# Patient Record
Sex: Female | Born: 1950 | Race: White | Hispanic: No | Marital: Single
Health system: Southern US, Community
[De-identification: ages and names within clinical notes are randomized; demographics above are authoritative.]

## PROBLEM LIST (undated history)

## (undated) DIAGNOSIS — J9621 Acute and chronic respiratory failure with hypoxia: Secondary | ICD-10-CM

## (undated) DIAGNOSIS — U071 COVID-19: Secondary | ICD-10-CM

## (undated) DIAGNOSIS — N186 End stage renal disease: Secondary | ICD-10-CM

## (undated) DIAGNOSIS — J1282 Pneumonia due to coronavirus disease 2019: Secondary | ICD-10-CM

---

## 2019-11-05 ENCOUNTER — Inpatient Hospital Stay
Admission: RE | Admit: 2019-11-05 | Discharge: 2019-12-19 | Disposition: A | Discharge status: Skilled Nursing Facility | Payer: Medicare Other | Source: Ambulatory Visit | Attending: Internal Medicine | Admitting: Internal Medicine

## 2019-11-05 ENCOUNTER — Ambulatory Visit (HOSPITAL_COMMUNITY)
Admission: AD | Admit: 2019-11-05 | Discharge: 2019-11-05 | Disposition: A | Discharge status: Home / Self Care | Payer: Medicare Other | Source: Other Acute Inpatient Hospital | Attending: Internal Medicine | Admitting: Internal Medicine

## 2019-11-05 ENCOUNTER — Inpatient Hospital Stay
Admission: RE | Admit: 2019-11-05 | Payer: Medicare Other | Source: Other Acute Inpatient Hospital | Admitting: Internal Medicine

## 2019-11-05 ENCOUNTER — Other Ambulatory Visit (HOSPITAL_COMMUNITY): Payer: Medicare Other

## 2019-11-05 DIAGNOSIS — J9690 Respiratory failure, unspecified, unspecified whether with hypoxia or hypercapnia: Secondary | ICD-10-CM

## 2019-11-05 DIAGNOSIS — J80 Acute respiratory distress syndrome: Secondary | ICD-10-CM

## 2019-11-05 DIAGNOSIS — Z992 Dependence on renal dialysis: Secondary | ICD-10-CM

## 2019-11-05 DIAGNOSIS — N186 End stage renal disease: Secondary | ICD-10-CM | POA: Insufficient documentation

## 2019-11-05 DIAGNOSIS — Z9911 Dependence on respirator [ventilator] status: Secondary | ICD-10-CM

## 2019-11-05 DIAGNOSIS — R1110 Vomiting, unspecified: Secondary | ICD-10-CM

## 2019-11-05 DIAGNOSIS — J811 Chronic pulmonary edema: Secondary | ICD-10-CM

## 2019-11-05 DIAGNOSIS — J1282 Pneumonia due to coronavirus disease 2019: Secondary | ICD-10-CM | POA: Diagnosis present

## 2019-11-05 DIAGNOSIS — Z931 Gastrostomy status: Secondary | ICD-10-CM

## 2019-11-05 DIAGNOSIS — J189 Pneumonia, unspecified organism: Secondary | ICD-10-CM

## 2019-11-05 DIAGNOSIS — U071 COVID-19: Secondary | ICD-10-CM | POA: Diagnosis not present

## 2019-11-05 DIAGNOSIS — J9621 Acute and chronic respiratory failure with hypoxia: Secondary | ICD-10-CM | POA: Diagnosis present

## 2019-11-05 HISTORY — DX: Acute and chronic respiratory failure with hypoxia: J96.21

## 2019-11-05 HISTORY — DX: End stage renal disease: N18.6

## 2019-11-05 HISTORY — DX: Pneumonia due to coronavirus disease 2019: J12.82

## 2019-11-05 HISTORY — DX: COVID-19: U07.1

## 2019-11-05 LAB — BLOOD GAS, ARTERIAL
Acid-base deficit: 1.9 mmol/L (ref 0.0–2.0)
Acid-base deficit: 2 mmol/L (ref 0.0–2.0)
Bicarbonate: 24 mmol/L (ref 20.0–28.0)
Bicarbonate: 24 mmol/L (ref 20.0–28.0)
FIO2: 50
FIO2: 50
O2 Saturation: 95.6 %
O2 Saturation: 97.2 %
Patient temperature: 37
Patient temperature: 36.9
pCO2 arterial: 53.3 mmHg — ABNORMAL HIGH (ref 32.0–48.0)
pCO2 arterial: 54.6 mmHg — ABNORMAL HIGH (ref 32.0–48.0)
pH, Arterial: 7.275 — ABNORMAL LOW (ref 7.350–7.450)
pH, Arterial: 7.266 — ABNORMAL LOW (ref 7.350–7.450)
pO2, Arterial: 86.9 mmHg (ref 83.0–108.0)
pO2, Arterial: 104 mmHg (ref 83.0–108.0)

## 2019-11-05 LAB — COMPREHENSIVE METABOLIC PANEL
ALT: 14 U/L (ref 0–44)
AST: 19 U/L (ref 15–41)
Albumin: 2.3 g/dL — ABNORMAL LOW (ref 3.5–5.0)
Alkaline Phosphatase: 92 U/L (ref 38–126)
Anion gap: 15 (ref 5–15)
BUN: 99 mg/dL — ABNORMAL HIGH (ref 8–23)
CO2: 23 mmol/L (ref 22–32)
Calcium: 9.6 mg/dL (ref 8.9–10.3)
Chloride: 95 mmol/L — ABNORMAL LOW (ref 98–111)
Creatinine, Ser: 5.21 mg/dL — ABNORMAL HIGH (ref 0.44–1.00)
GFR, Estimated: 8 mL/min — ABNORMAL LOW (ref >60)
Glucose, Bld: 110 mg/dL — ABNORMAL HIGH (ref 70–99)
Potassium: 3.5 mmol/L (ref 3.5–5.1)
Sodium: 133 mmol/L — ABNORMAL LOW (ref 135–145)
Total Bilirubin: 1 mg/dL (ref 0.3–1.2)
Total Protein: 5.3 g/dL — ABNORMAL LOW (ref 6.5–8.1)

## 2019-11-05 LAB — CBC WITH DIFFERENTIAL/PLATELET
Abs Immature Granulocytes: 0.19 10*3/uL — ABNORMAL HIGH (ref 0.00–0.07)
Basophils Absolute: 0.1 10*3/uL (ref 0.0–0.1)
Basophils Relative: 1 %
Eosinophils Absolute: 0.7 10*3/uL — ABNORMAL HIGH (ref 0.0–0.5)
Eosinophils Relative: 7 %
HCT: 30.6 % — ABNORMAL LOW (ref 36.0–46.0)
Hemoglobin: 9.2 g/dL — ABNORMAL LOW (ref 12.0–15.0)
Immature Granulocytes: 2 %
Lymphocytes Relative: 17 %
Lymphs Abs: 1.8 10*3/uL (ref 0.7–4.0)
MCH: 26.5 pg (ref 26.0–34.0)
MCHC: 30.1 g/dL (ref 30.0–36.0)
MCV: 88.2 fL (ref 80.0–100.0)
Monocytes Absolute: 0.6 10*3/uL (ref 0.1–1.0)
Monocytes Relative: 6 %
Neutro Abs: 7.1 10*3/uL (ref 1.7–7.7)
Neutrophils Relative %: 67 %
Platelets: 269 10*3/uL (ref 150–400)
RBC: 3.47 MIL/uL — ABNORMAL LOW (ref 3.87–5.11)
RDW: 16.3 % — ABNORMAL HIGH (ref 11.5–15.5)
WBC: 10.5 10*3/uL (ref 4.0–10.5)
nRBC: 0 % (ref 0.0–0.2)

## 2019-11-05 LAB — PROTIME-INR
INR: 1.1 (ref 0.8–1.2)
Prothrombin Time: 13.9 seconds (ref 11.4–15.2)

## 2019-11-05 LAB — MAGNESIUM: Magnesium: 2.4 mg/dL (ref 1.7–2.4)

## 2019-11-05 LAB — T4, FREE: Free T4: 0.66 ng/dL (ref 0.61–1.12)

## 2019-11-05 LAB — PHOSPHORUS: Phosphorus: 4.9 mg/dL — ABNORMAL HIGH (ref 2.5–4.6)

## 2019-11-05 LAB — TSH: TSH: 5.566 u[IU]/mL — ABNORMAL HIGH (ref 0.350–4.500)

## 2019-11-05 MED ORDER — IOHEXOL 300 MG/ML  SOLN
50.0000 mL | Freq: Once | INTRAMUSCULAR | Status: AC | PRN
  Administered 2019-11-05: 20 mL

## 2019-11-06 ENCOUNTER — Encounter: Payer: Self-pay | Admitting: Internal Medicine

## 2019-11-06 ENCOUNTER — Other Ambulatory Visit (HOSPITAL_COMMUNITY): Payer: Medicare Other

## 2019-11-06 DIAGNOSIS — Z9911 Dependence on respirator [ventilator] status: Secondary | ICD-10-CM | POA: Diagnosis not present

## 2019-11-06 DIAGNOSIS — J9621 Acute and chronic respiratory failure with hypoxia: Secondary | ICD-10-CM | POA: Diagnosis not present

## 2019-11-06 DIAGNOSIS — U071 COVID-19: Secondary | ICD-10-CM | POA: Diagnosis not present

## 2019-11-06 DIAGNOSIS — N186 End stage renal disease: Secondary | ICD-10-CM

## 2019-11-06 DIAGNOSIS — J1282 Pneumonia due to coronavirus disease 2019: Secondary | ICD-10-CM

## 2019-11-06 DIAGNOSIS — Z992 Dependence on renal dialysis: Secondary | ICD-10-CM

## 2019-11-06 NOTE — Consult Note (Signed)
Pulmonary [place]  Date of Service: 11/06/2019  PULMONARY CRITICAL CARE CONSULT   Veronica Melton  VEL:[id number]  DOB: 1950-06-07   DOA: 11/05/2019  Referring Physician: Merton Border, MD  HPI: Veronica Melton is a 69 y.o. female seen for follow up of Acute on Chronic Respiratory Failure. Patient has a past medical history that is significant for hyperlipidemia type 2 diabetes chronic pain restless legs GERD obesity hypertension who was seen initially with complaints of viral illness.  She was tested positive for COVID-19 and initially managed as an outpatient however after 9 days on she presented to the hospital with increasing shortness of breath cough worsening diffuse body aches nausea vomiting.  In the emergency department was noted to have a pulse oximetry in the 80s was initially placed on 10 L of oxygen chest x-ray showed multifocal infiltrates pneumonia.  Patient had a decline in her status ended up intubated on the ventilator patient had been given remdesivir and Decadron.  In addition she received cefepime and azithromycin for presumed bacterial infection.  Subsequently not able to wean and is transferred now to our facility for further management.  Patient has tracheostomy and PEG tube placed on October 14  Review of Systems:  ROS performed and is unremarkable other than noted above.  Past medical history:  . Type 2 diabetes mellitus with stage 3a chronic kidney disease, without long-term current use of insulin ([place])  . Hypercholesterolemia  . Plantar fasciitis, bilateral  . B12 deficiency  . Recurrent oral herpes simplex  . Chronic bilateral low back pain with left-sided sciatica  . Restless legs syndrome  . Gastroesophageal reflux disease without esophagitis  . Allergic conjunctivitis of both eyes  . Primary insomnia  . Primary osteoarthritis involving multiple joints  . NAFLD (nonalcoholic fatty liver disease)   . GAD (generalized anxiety disorder)  . Recurrent nephrolithiasis  . Obesity (BMI 30.0-34.9)  . Irritable bowel syndrome without diarrhea  . Essential hypertension  . History of colonic polyps  . Sensation of feeling hot  . History of dysphagia  . History of shortness of breath  . Chronic pain of right knee   Past surgical history: Tracheostomy PEG tube placement  Social history: Negative for tobacco alcohol or drug abuse right now  Allergies: Contrast lisinopril diclofenac gabapentin  Family history: Noncontributory to the present illness  Medications: Reviewed on Rounds  Physical Exam:  Vitals: Temperature is 96.9 pulse 82 respiratory 24 blood pressure is 120/52 saturations 97%  Ventilator Settings on assist control FiO2 50% tidal volume 420 PEEP 6  . General: Comfortable at this time . Eyes: Grossly normal lids, irises & conjunctiva . ENT: grossly tongue is normal . Neck: no obvious mass . Cardiovascular: S1-S2 normal no gallop or rub . Respiratory: No rhonchi no rales are noted at this time . Abdomen: Soft and nontender . Skin: no rash seen on limited exam . Musculoskeletal: not rigid . Psychiatric:unable to assess . Neurologic: no seizure no involuntary movements         Labs on Admission:  Basic Metabolic Panel: Recent Labs  Lab 11/05/19 2052  NA 133*  K 3.5  CL 95*  CO2 23  GLUCOSE 110*  BUN 99*  CREATININE 5.21*  CALCIUM 9.6  MG 2.4  PHOS 4.9*    Recent Labs  Lab 11/05/19 1236 11/05/19 1445  PHART 7.275* 7.266*  PCO2ART 53.3* 54.6*  PO2ART 86.9 104  HCO3 24.0 24.0  O2SAT 95.6 97.2  Liver Function Tests: Recent Labs  Lab 11/05/19 2052  AST 19  ALT 14  ALKPHOS 92  BILITOT 1.0  PROT 5.3*  ALBUMIN 2.3*   No results for input(s): LIPASE, AMYLASE in the last 168 hours. No results for input(s): AMMONIA in the last 168 hours.  CBC: Recent Labs  Lab 11/05/19 2052  WBC 10.5  NEUTROABS 7.1  HGB 9.2*  HCT 30.6*  MCV 88.2   PLT 269    Cardiac Enzymes: No results for input(s): CKTOTAL, CKMB, CKMBINDEX, TROPONINI in the last 168 hours.  BNP (last 3 results) No results for input(s): BNP in the last 8760 hours.  ProBNP (last 3 results) No results for input(s): PROBNP in the last 8760 hours.   Radiological Exams on Admission: DG ABDOMEN PEG TUBE LOCATION  Result Date: 11/05/2019 CLINICAL DATA:  Peg tube placement. EXAM: ABDOMEN - 1 VIEW COMPARISON:  None. FINDINGS: The bowel gas pattern is normal. Distal tip of gastrostomy tube appears to be within distal gastric lumen. Contrast is filling the gastric lumen as well as the proximal duodenum. No extravasation or leakage is noted. IMPRESSION: Distal tip of gastrostomy tube appears to be within distal gastric lumen. No extravasation or leakage is noted. Electronically Signed   By: Marijo Conception M.D.   On: 11/05/2019 15:44   DG Chest Port 1 View  Result Date: 11/06/2019 CLINICAL DATA:  Pleural effusion.  Evaluate for pneumothorax. EXAM: PORTABLE CHEST 1 VIEW COMPARISON:  None. FINDINGS: Diffuse bilateral airspace opacities. No pneumothorax is seen. Two RIGHT-sided central lines appear appropriately positioned with tip at the level of the lower SVC/cavoatrial junction. Tracheostomy tube appears appropriately positioned in the midline. IMPRESSION: 1. No pneumothorax is seen. 2. Diffuse bilateral airspace opacities, compatible with pulmonary edema versus multifocal pneumonia. Electronically Signed   By: Franki Cabot M.D.   On: 11/06/2019 06:59    Assessment/Plan Active Problems:   Acute on chronic respiratory failure with hypoxia (HCC)   End stage renal disease on dialysis ([place])   COVID-19 virus infection   Pneumonia due to COVID-19 virus   1. Acute on chronic respiratory failure hypoxia at this time patient is on the ventilator and full support.  Respiratory therapy is going to assess the RSB I mechanics and try for 2-hour pressure support wean. 2. COVID-19  virus infection in recovery phase we will continue to monitor.  Chest x-ray still shows diffuse bilateral airspace disease 3. Chronic kidney disease we will monitor the labs monitor hydration status patient had been dialysis at the other facility.  Creatinine right now is running 5.2 will need nephrology follow-up 4. COVID-19 pneumonia patient with residual deficits noted on the chest x-ray will need ongoing monitoring.  I have personally seen and evaluated the patient, evaluated laboratory and imaging results, formulated the assessment and plan and placed orders. The Patient requires high complexity decision making with multiple systems involvement.  Case was discussed on Rounds with the Respiratory Therapy Director and the Respiratory staff Time Spent 65minutes  Kanishk Stroebel A Jah Alarid, MD [facility] Pulmonary Critical Care Medicine Sleep Medicine

## 2019-11-07 ENCOUNTER — Other Ambulatory Visit (HOSPITAL_COMMUNITY): Payer: Medicare Other

## 2019-11-07 DIAGNOSIS — N186 End stage renal disease: Secondary | ICD-10-CM | POA: Diagnosis not present

## 2019-11-07 DIAGNOSIS — J9621 Acute and chronic respiratory failure with hypoxia: Secondary | ICD-10-CM | POA: Diagnosis not present

## 2019-11-07 DIAGNOSIS — Z9911 Dependence on respirator [ventilator] status: Secondary | ICD-10-CM | POA: Diagnosis not present

## 2019-11-07 DIAGNOSIS — U071 COVID-19: Secondary | ICD-10-CM | POA: Diagnosis not present

## 2019-11-07 LAB — RENAL FUNCTION PANEL
Albumin: 2.6 g/dL — ABNORMAL LOW (ref 3.5–5.0)
Anion gap: 16 — ABNORMAL HIGH (ref 5–15)
BUN: 127 mg/dL — ABNORMAL HIGH (ref 8–23)
CO2: 22 mmol/L (ref 22–32)
Calcium: 10.2 mg/dL (ref 8.9–10.3)
Chloride: 96 mmol/L — ABNORMAL LOW (ref 98–111)
Creatinine, Ser: 5.87 mg/dL — ABNORMAL HIGH (ref 0.44–1.00)
GFR, Estimated: 7 mL/min — ABNORMAL LOW (ref >60)
Glucose, Bld: 146 mg/dL — ABNORMAL HIGH (ref 70–99)
Phosphorus: 7.6 mg/dL — ABNORMAL HIGH (ref 2.5–4.6)
Potassium: 3.9 mmol/L (ref 3.5–5.1)
Sodium: 134 mmol/L — ABNORMAL LOW (ref 135–145)

## 2019-11-07 LAB — BLOOD GAS, ARTERIAL
Acid-base deficit: 3.9 mmol/L — ABNORMAL HIGH (ref 0.0–2.0)
Bicarbonate: 22.8 mmol/L (ref 20.0–28.0)
FIO2: 50
O2 Saturation: 97.7 %
Patient temperature: 36.8
pCO2 arterial: 57.6 mmHg — ABNORMAL HIGH (ref 32.0–48.0)
pH, Arterial: 7.22 — ABNORMAL LOW (ref 7.350–7.450)
pO2, Arterial: 115 mmHg — ABNORMAL HIGH (ref 83.0–108.0)

## 2019-11-07 LAB — HEPATITIS B SURFACE ANTIGEN: Hepatitis B Surface Ag: NONREACTIVE

## 2019-11-07 LAB — HEPATITIS B CORE ANTIBODY, TOTAL: Hep B Core Total Ab: NONREACTIVE

## 2019-11-07 LAB — CBC
HCT: 31.7 % — ABNORMAL LOW (ref 36.0–46.0)
Hemoglobin: 9.6 g/dL — ABNORMAL LOW (ref 12.0–15.0)
MCH: 26.4 pg (ref 26.0–34.0)
MCHC: 30.3 g/dL (ref 30.0–36.0)
MCV: 87.3 fL (ref 80.0–100.0)
Platelets: 227 10*3/uL (ref 150–400)
RBC: 3.63 MIL/uL — ABNORMAL LOW (ref 3.87–5.11)
RDW: 16.3 % — ABNORMAL HIGH (ref 11.5–15.5)
WBC: 9.9 10*3/uL (ref 4.0–10.5)
nRBC: 0 % (ref 0.0–0.2)

## 2019-11-07 LAB — MAGNESIUM: Magnesium: 2.7 mg/dL — ABNORMAL HIGH (ref 1.7–2.4)

## 2019-11-07 LAB — HEMOGLOBIN A1C
Hgb A1c MFr Bld: 6.7 % — ABNORMAL HIGH (ref 4.8–5.6)
Mean Plasma Glucose: 146 mg/dL

## 2019-11-07 LAB — HEPATITIS B SURFACE ANTIBODY,QUALITATIVE: Hep B S Ab: NONREACTIVE

## 2019-11-07 NOTE — Consult Note (Signed)
[facility] CONSULT NOTE    Date: 11/07/2019                  Patient Name:  Veronica Melton  MRN: [id number]  DOB: [birth year]  Age / Sex: 69 y.o., female         PCP: Patient, No Pcp Per                 Service Requesting Consult:  Hospitalist                 Reason for Consult:  Acute kidney injury/chronic kidney disease stage IIIa            History of Present Illness: Patient is a 69 y.o. female with a PMHx of diabetes mellitus type 2, hyperlipidemia, plantar fasciitis, B12 deficiency, chronic low back pain, restless leg syndrome, GERD, nonalcoholic fatty liver disease, anxiety, nephrolithiasis, irritable bowel syndrome, hypertension, chronic kidney disease stage IIIa, who was admitted to [facility] on 11/05/2019 for ongoing treatment of post COVID-19 recovery.  Patient currently on the ventilator.  She developed COVID-19 infection back on September 8.  She has a tracheostomy in place and still requiring significant ventilatory support.  We are now asked to see her for evaluation management of acute kidney injury in the setting of chronic kidney disease stage IIIa.  Patient has a temporary right internal jugular dialysis catheter in place.  She was on MWF schedule at the outside facility.  Currently patient unable to provide any history as she still on the ventilator.  She has significant azotemia now with a BUN of 127 and creatinine of 5.87.  She also has anemia of chronic kidney disease with hemoglobin of 9.6.  Patient also has secondary hyperparathyroidism with serum phosphorus of 7.6.   Medications:  Current medications: Folic acid 2 mg IV daily, Solu-Medrol 40 mg IV daily, amantadine 100 mg daily, calcium acetate 667 mg 3 times daily, fish oil 6 g daily, heparin 5000 units subcutaneous twice daily, hydroxyurea 500 mg twice daily, melatonin 3 mg nightly, modafinil 100 mg daily, oxycodone 5 mg nightly, Zosyn 3.375 g IV twice daily, prostat 30 cc twice daily, Protonix  40 mg daily, Retacrit 20,000 units subcutaneous Monday Wednesday Friday  Allergies: Lisinopril   Past Medical History: diabetes mellitus type 2, hyperlipidemia, plantar fasciitis, B12 deficiency, chronic low back pain, restless leg syndrome, GERD, nonalcoholic fatty liver disease, anxiety, nephrolithiasis, irritable bowel syndrome, hypertension, chronic kidney disease stage IIIa,  Past Surgical History: Status post tracheostomy placement  Family History: Unable to obtain from the patient as she is on the ventilator.  Social History: Unable to obtain from the patient as she is on the ventilator.  Review of Systems: Unable to obtain from the patient as she is on the ventilator.  Vital Signs: Temperature 96.6 pulse 82 respirations 20 blood pressure 120/53   Physical Exam: General:  Critically ill-appearing  Head:  Normocephalic, atraumatic. Moist oral mucosal membranes  Eyes:  Anicteric  Neck:  Tracheostomy in place  Lungs:   Coarse breath sounds bilateral, vent assisted  Heart:  S1S2 no rubs  Abdomen:   Soft, nontender, bowel sounds present  Extremities:  1+ peripheral edema.  Neurologic:  Arousable but not following commands  Skin:  No acute rash  Access:  Temporary right IJ dialysis catheter    Lab results: Basic Metabolic Panel: Recent Labs  Lab 11/05/19 2052 11/07/19 0757  NA 133* 134*  K 3.5 3.9  CL 95* 96*  CO2  23 22  GLUCOSE 110* 146*  BUN 99* 127*  CREATININE 5.21* 5.87*  CALCIUM 9.6 10.2  MG 2.4 2.7*  PHOS 4.9* 7.6*    Liver Function Tests: Recent Labs  Lab 11/05/19 2050/04/22 11/07/19 0757  AST 19  --   ALT 14  --   ALKPHOS 92  --   BILITOT 1.0  --   PROT 5.3*  --   ALBUMIN 2.3* 2.6*   No results for input(s): LIPASE, AMYLASE in the last 168 hours. No results for input(s): AMMONIA in the last 168 hours.  CBC: Recent Labs  Lab 11/05/19 April 22, 2050 11/07/19 0757  WBC 10.5 9.9  NEUTROABS 7.1  --   HGB 9.2* 9.6*  HCT 30.6* 31.7*  MCV 88.2 87.3   PLT 269 227    Cardiac Enzymes: No results for input(s): CKTOTAL, CKMB, CKMBINDEX, TROPONINI in the last 168 hours.  BNP: Invalid input(s): POCBNP  CBG: No results for input(s): GLUCAP in the last 168 hours.  Microbiology: Results for orders placed or performed during the hospital encounter of 11/05/19  Culture, respiratory (non-expectorated)     Status: None (Preliminary result)   Collection Time: 11/06/19  1:46 PM   Specimen: Tracheal Aspirate; Respiratory  Result Value Ref Range Status   Specimen Description TRACHEAL ASPIRATE  Final   Special Requests NONE  Final   Gram Stain   Final    RARE WBC PRESENT, PREDOMINANTLY PMN NO ORGANISMS SEEN    Culture   Final    TOO YOUNG TO READ Performed at [facility], [place] [address] [place], [place] [address]    Report Status PENDING  Incomplete    Coagulation Studies: Recent Labs    11/05/19 04/22/50  LABPROT 13.9  INR 1.1    Urinalysis: No results for input(s): COLORURINE, LABSPEC, PHURINE, GLUCOSEU, HGBUR, BILIRUBINUR, KETONESUR, PROTEINUR, UROBILINOGEN, NITRITE, LEUKOCYTESUR in the last 72 hours.  Invalid input(s): APPERANCEUR    Imaging: DG ABDOMEN PEG TUBE LOCATION  Result Date: 11/05/2019 CLINICAL DATA:  Peg tube placement. EXAM: ABDOMEN - 1 VIEW COMPARISON:  None. FINDINGS: The bowel gas pattern is normal. Distal tip of gastrostomy tube appears to be within distal gastric lumen. Contrast is filling the gastric lumen as well as the proximal duodenum. No extravasation or leakage is noted. IMPRESSION: Distal tip of gastrostomy tube appears to be within distal gastric lumen. No extravasation or leakage is noted. Electronically Signed   By: Marijo Conception M.D.   On: 11/05/2019 15:44   DG Chest Port 1 View  Result Date: 11/07/2019 CLINICAL DATA:  Pneumonia EXAM: PORTABLE CHEST 1 VIEW COMPARISON:  Yesterday FINDINGS: Tracheostomy tube that is well seated. Central lines on the right with tips at the right atrium. The  small bore catheter is lower than the dialysis catheter. Confluent airspace disease. No visible effusion or pneumothorax. Stable cardiac enlargement IMPRESSION: Stable aeration and hardware positioning. The small bore central line tip extends to the level of the lower right atrium. Electronically Signed   By: Monte Fantasia M.D.   On: 11/07/2019 07:23   DG Chest Port 1 View  Result Date: 11/06/2019 CLINICAL DATA:  Pleural effusion.  Evaluate for pneumothorax. EXAM: PORTABLE CHEST 1 VIEW COMPARISON:  None. FINDINGS: Diffuse bilateral airspace opacities. No pneumothorax is seen. Two RIGHT-sided central lines appear appropriately positioned with tip at the level of the lower SVC/cavoatrial junction. Tracheostomy tube appears appropriately positioned in the midline. IMPRESSION: 1. No pneumothorax is seen. 2. Diffuse bilateral airspace opacities, compatible with  pulmonary edema versus multifocal pneumonia. Electronically Signed   By: Franki Cabot M.D.   On: 11/06/2019 06:59      Assessment & Plan: Pt is a 69 y.o. female with a PMHx of diabetes mellitus type 2, hyperlipidemia, plantar fasciitis, B12 deficiency, chronic low back pain, restless leg syndrome, GERD, nonalcoholic fatty liver disease, anxiety, nephrolithiasis, irritable bowel syndrome, hypertension, chronic kidney disease stage IIIa, who was admitted to [facility] on 11/05/2019 for ongoing treatment of post COVID-19 recovery.  1.  Acute kidney injury/chronic kidney disease stage IIIa.  Patient with significant azotemia.  She has a temporary right internal jugular dialysis catheter in place.  We plan to perform hemodialysis treatment on Tuesday.  Orders have been prepared.  2.  Acute respiratory failure secondary to COVID-19 infection.  Patient currently on the ventilator.  She has tracheostomy in place.  Weaning as per pulmonary/critical care.  3.  Anemia of chronic kidney disease.  Hemoglobin currently 9.6.  Was on Retacrit 20,000 units  subcutaneous with each dialysis treatment.  We will continue this for now.  4.  Secondary hyperparathyroidism.  Phosphorus is risen to 7.6.  Should start coming down with reinitiation of dialysis.  Otherwise maintain the patient on calcium acetate.  5.  Thanks for consultation.

## 2019-11-07 NOTE — Progress Notes (Signed)
Pulmonary Critical Care Medicine [place]   PULMONARY CRITICAL CARE SERVICE  PROGRESS NOTE  Date of Service: 11/07/2019  Veronica Melton  BTD:[id number]  DOB: 1950/04/24   DOA: 11/05/2019  Referring Physician: Merton Border, MD  HPI: Veronica Melton is a 69 y.o. female seen for follow up of Acute on Chronic Respiratory Failure.  Patient this morning was on pressure support with a 4-hour goal however his ABG showing some acidosis.  Patient also waiting to be seen by nephrology for recommendations on renal issues  Medications: Reviewed on Rounds  Physical Exam:  Vitals: Temperature is 96.6 pulse 82 respiratory rate 20 blood pressure is 120/53 saturations 94%  Ventilator Settings on pressure support FiO2 50% pressure 12/6  . General: Comfortable at this time . Eyes: Grossly normal lids, irises & conjunctiva . ENT: grossly tongue is normal . Neck: no obvious mass . Cardiovascular: S1 S2 normal no gallop . Respiratory: Scattered rhonchi are noted bilaterally . Abdomen: soft . Skin: no rash seen on limited exam . Musculoskeletal: not rigid . Psychiatric:unable to assess . Neurologic: no seizure no involuntary movements         Lab Data:   Basic Metabolic Panel: Recent Labs  Lab 11/05/19 2052 11/07/19 0757  NA 133* 134*  K 3.5 3.9  CL 95* 96*  CO2 23 22  GLUCOSE 110* 146*  BUN 99* 127*  CREATININE 5.21* 5.87*  CALCIUM 9.6 10.2  MG 2.4 2.7*  PHOS 4.9* 7.6*    ABG: Recent Labs  Lab 11/05/19 1236 11/05/19 1445 11/07/19 0845  PHART 7.275* 7.266* 7.220*  PCO2ART 53.3* 54.6* 57.6*  PO2ART 86.9 104 115*  HCO3 24.0 24.0 22.8  O2SAT 95.6 97.2 97.7    Liver Function Tests: Recent Labs  Lab 11/05/19 2052 11/07/19 0757  AST 19  --   ALT 14  --   ALKPHOS 92  --   BILITOT 1.0  --   PROT 5.3*  --   ALBUMIN 2.3* 2.6*   No results for input(s): LIPASE, AMYLASE in the last 168 hours. No results for input(s): AMMONIA in the last 168  hours.  CBC: Recent Labs  Lab 11/05/19 2052 11/07/19 0757  WBC 10.5 9.9  NEUTROABS 7.1  --   HGB 9.2* 9.6*  HCT 30.6* 31.7*  MCV 88.2 87.3  PLT 269 227    Cardiac Enzymes: No results for input(s): CKTOTAL, CKMB, CKMBINDEX, TROPONINI in the last 168 hours.  BNP (last 3 results) No results for input(s): BNP in the last 8760 hours.  ProBNP (last 3 results) No results for input(s): PROBNP in the last 8760 hours.  Radiological Exams: DG ABDOMEN PEG TUBE LOCATION  Result Date: 11/05/2019 CLINICAL DATA:  Peg tube placement. EXAM: ABDOMEN - 1 VIEW COMPARISON:  None. FINDINGS: The bowel gas pattern is normal. Distal tip of gastrostomy tube appears to be within distal gastric lumen. Contrast is filling the gastric lumen as well as the proximal duodenum. No extravasation or leakage is noted. IMPRESSION: Distal tip of gastrostomy tube appears to be within distal gastric lumen. No extravasation or leakage is noted. Electronically Signed   By: Marijo Conception M.D.   On: 11/05/2019 15:44   DG Chest Port 1 View  Result Date: 11/07/2019 CLINICAL DATA:  Pneumonia EXAM: PORTABLE CHEST 1 VIEW COMPARISON:  Yesterday FINDINGS: Tracheostomy tube that is well seated. Central lines on the right with tips at the right atrium. The small bore catheter is lower than the dialysis catheter. Confluent airspace disease.  No visible effusion or pneumothorax. Stable cardiac enlargement IMPRESSION: Stable aeration and hardware positioning. The small bore central line tip extends to the level of the lower right atrium. Electronically Signed   By: Monte Fantasia M.D.   On: 11/07/2019 07:23   DG Chest Port 1 View  Result Date: 11/06/2019 CLINICAL DATA:  Pleural effusion.  Evaluate for pneumothorax. EXAM: PORTABLE CHEST 1 VIEW COMPARISON:  None. FINDINGS: Diffuse bilateral airspace opacities. No pneumothorax is seen. Two RIGHT-sided central lines appear appropriately positioned with tip at the level of the lower  SVC/cavoatrial junction. Tracheostomy tube appears appropriately positioned in the midline. IMPRESSION: 1. No pneumothorax is seen. 2. Diffuse bilateral airspace opacities, compatible with pulmonary edema versus multifocal pneumonia. Electronically Signed   By: Franki Cabot M.D.   On: 11/06/2019 06:59    Assessment/Plan Active Problems:   Acute on chronic respiratory failure with hypoxia (HCC)   End stage renal disease on dialysis ([place])   COVID-19 virus infection   Pneumonia due to COVID-19 virus   1. Acute on chronic respiratory failure with hypoxia patient's ABG is not feasible for weaning.  Nephrology is to see the patient regarding renal failure hopefully once patient is on regular dialysis will be able to move aggressively on the weaning. 2. End-stage renal failure on hemodialysis we will continue to follow along. 3. COVID-19 virus infection in recovery 4. Pneumonia due to COVID-19 chest x-ray showing multifocal disease still   I have personally seen and evaluated the patient, evaluated laboratory and imaging results, formulated the assessment and plan and placed orders. The Patient requires high complexity decision making with multiple systems involvement.  Rounds were done with the Respiratory Therapy Director and Staff therapists and discussed with nursing staff also.  Allyne Gee, MD [facility] Pulmonary Critical Care Medicine Sleep Medicine

## 2019-11-08 ENCOUNTER — Other Ambulatory Visit (HOSPITAL_COMMUNITY): Payer: Medicare Other

## 2019-11-08 DIAGNOSIS — N186 End stage renal disease: Secondary | ICD-10-CM | POA: Diagnosis not present

## 2019-11-08 DIAGNOSIS — Z9911 Dependence on respirator [ventilator] status: Secondary | ICD-10-CM | POA: Diagnosis not present

## 2019-11-08 DIAGNOSIS — U071 COVID-19: Secondary | ICD-10-CM | POA: Diagnosis not present

## 2019-11-08 DIAGNOSIS — J9621 Acute and chronic respiratory failure with hypoxia: Secondary | ICD-10-CM | POA: Diagnosis not present

## 2019-11-08 LAB — RENAL FUNCTION PANEL
Albumin: 2.9 g/dL — ABNORMAL LOW (ref 3.5–5.0)
Anion gap: 13 (ref 5–15)
BUN: 60 mg/dL — ABNORMAL HIGH (ref 8–23)
CO2: 27 mmol/L (ref 22–32)
Calcium: 9.9 mg/dL (ref 8.9–10.3)
Chloride: 97 mmol/L — ABNORMAL LOW (ref 98–111)
Creatinine, Ser: 3.31 mg/dL — ABNORMAL HIGH (ref 0.44–1.00)
GFR, Estimated: 14 mL/min — ABNORMAL LOW (ref >60)
Glucose, Bld: 168 mg/dL — ABNORMAL HIGH (ref 70–99)
Phosphorus: 3.6 mg/dL (ref 2.5–4.6)
Potassium: 3.5 mmol/L (ref 3.5–5.1)
Sodium: 137 mmol/L (ref 135–145)

## 2019-11-08 LAB — CBC
HCT: 33.6 % — ABNORMAL LOW (ref 36.0–46.0)
Hemoglobin: 10.2 g/dL — ABNORMAL LOW (ref 12.0–15.0)
MCH: 26.4 pg (ref 26.0–34.0)
MCHC: 30.4 g/dL (ref 30.0–36.0)
MCV: 87 fL (ref 80.0–100.0)
Platelets: 278 10*3/uL (ref 150–400)
RBC: 3.86 MIL/uL — ABNORMAL LOW (ref 3.87–5.11)
RDW: 16.6 % — ABNORMAL HIGH (ref 11.5–15.5)
WBC: 11.2 10*3/uL — ABNORMAL HIGH (ref 4.0–10.5)
nRBC: 0 % (ref 0.0–0.2)

## 2019-11-08 LAB — BLOOD GAS, ARTERIAL
Acid-Base Excess: 2.8 mmol/L — ABNORMAL HIGH (ref 0.0–2.0)
Bicarbonate: 28.5 mmol/L — ABNORMAL HIGH (ref 20.0–28.0)
FIO2: 50
O2 Saturation: 95.8 %
Patient temperature: 36.4
pCO2 arterial: 57.4 mmHg — ABNORMAL HIGH (ref 32.0–48.0)
pH, Arterial: 7.314 — ABNORMAL LOW (ref 7.350–7.450)
pO2, Arterial: 80.2 mmHg — ABNORMAL LOW (ref 83.0–108.0)

## 2019-11-08 LAB — CULTURE, RESPIRATORY W GRAM STAIN: Culture: NORMAL

## 2019-11-08 LAB — MAGNESIUM: Magnesium: 2.2 mg/dL (ref 1.7–2.4)

## 2019-11-08 NOTE — Progress Notes (Signed)
Pulmonary Critical Care Medicine [place]   PULMONARY CRITICAL CARE SERVICE  PROGRESS NOTE  Date of Service: 11/08/2019  Veronica Melton  GTX:[id number]  DOB: [birth year]   DOA: 11/05/2019  Referring Physician: Merton Border, MD  HPI: Veronica Melton is a 69 y.o. female seen for follow up of Acute on Chronic Respiratory Failure.  Currently on full support on assist control mode has been on 50% FiO2 good volumes are noted at this time  Medications: Reviewed on Rounds  Physical Exam:  Vitals: Temperature is 97.8 pulse 99 respiratory rate 35 blood pressure is 154/75 saturations 94%  Ventilator Settings on assist control FiO2 is 50% PEEP 8 tidal volume 420  . General: Comfortable at this time . Eyes: Grossly normal lids, irises & conjunctiva . ENT: grossly tongue is normal . Neck: no obvious mass . Cardiovascular: S1 S2 normal no gallop . Respiratory: No rhonchi no rales are noted at this time . Abdomen: soft . Skin: no rash seen on limited exam . Musculoskeletal: not rigid . Psychiatric:unable to assess . Neurologic: no seizure no involuntary movements         Lab Data:   Basic Metabolic Panel: Recent Labs  Lab 11/05/19 2052 11/07/19 0757 11/08/19 0518  NA 133* 134* 137  K 3.5 3.9 3.5  CL 95* 96* 97*  CO2 23 22 27   GLUCOSE 110* 146* 168*  BUN 99* 127* 60*  CREATININE 5.21* 5.87* 3.31*  CALCIUM 9.6 10.2 9.9  MG 2.4 2.7* 2.2  PHOS 4.9* 7.6* 3.6    ABG: Recent Labs  Lab 11/05/19 1236 11/05/19 1445 11/07/19 0845 11/08/19 0543  PHART 7.275* 7.266* 7.220* 7.314*  PCO2ART 53.3* 54.6* 57.6* 57.4*  PO2ART 86.9 104 115* 80.2*  HCO3 24.0 24.0 22.8 28.5*  O2SAT 95.6 97.2 97.7 95.8    Liver Function Tests: Recent Labs  Lab 11/05/19 2052 11/07/19 0757 11/08/19 0518  AST 19  --   --   ALT 14  --   --   ALKPHOS 92  --   --   BILITOT 1.0  --   --   PROT 5.3*  --   --   ALBUMIN 2.3* 2.6* 2.9*   No results for input(s): LIPASE, AMYLASE in  the last 168 hours. No results for input(s): AMMONIA in the last 168 hours.  CBC: Recent Labs  Lab 11/05/19 2052 11/07/19 0757 11/08/19 0518  WBC 10.5 9.9 11.2*  NEUTROABS 7.1  --   --   HGB 9.2* 9.6* 10.2*  HCT 30.6* 31.7* 33.6*  MCV 88.2 87.3 87.0  PLT 269 227 278    Cardiac Enzymes: No results for input(s): CKTOTAL, CKMB, CKMBINDEX, TROPONINI in the last 168 hours.  BNP (last 3 results) No results for input(s): BNP in the last 8760 hours.  ProBNP (last 3 results) No results for input(s): PROBNP in the last 8760 hours.  Radiological Exams: DG CHEST PORT 1 VIEW  Result Date: 11/08/2019 CLINICAL DATA:  Ventilator dependence. EXAM: PORTABLE CHEST 1 VIEW COMPARISON:  11/07/2019 FINDINGS: 0645 hours. Low lung volumes with persistent diffuse bilateral airspace disease. Tracheostomy tube again noted. 2 right-sided central lines again noted with tip position over the SVC/RA junction and right atrium near the tricuspid valve, respectively. Telemetry leads overlie the chest. IMPRESSION: Stable exam. Diffuse bilateral airspace disease. The smaller lumen right-sided central line tip projects in the region of the tricuspid valve. Electronically Signed   By: Misty Stanley M.D.   On: 11/08/2019 07:22   DG Chest  Port 1 View  Result Date: 11/07/2019 CLINICAL DATA:  Pneumonia EXAM: PORTABLE CHEST 1 VIEW COMPARISON:  Yesterday FINDINGS: Tracheostomy tube that is well seated. Central lines on the right with tips at the right atrium. The small bore catheter is lower than the dialysis catheter. Confluent airspace disease. No visible effusion or pneumothorax. Stable cardiac enlargement IMPRESSION: Stable aeration and hardware positioning. The small bore central line tip extends to the level of the lower right atrium. Electronically Signed   By: Monte Fantasia M.D.   On: 11/07/2019 07:23    Assessment/Plan Active Problems:   Acute on chronic respiratory failure with hypoxia (HCC)   End stage  renal disease on dialysis ([place])   COVID-19 virus infection   Pneumonia due to COVID-19 virus   1. Acute on chronic respiratory failure hypoxia we will continue with full support on the ventilator.  The patient's yesterday was supposed to wean on pressure support but did not tolerate it. 2. End-stage renal failure on hemodialysis being seen by nephrology 3. COVID-19 virus infection in recovery 4. Pneumonia due to COVID-19 treated slow improvement   I have personally seen and evaluated the patient, evaluated laboratory and imaging results, formulated the assessment and plan and placed orders. The Patient requires high complexity decision making with multiple systems involvement.  Rounds were done with the Respiratory Therapy Director and Staff therapists and discussed with nursing staff also.  Allyne Gee, MD [facility] Pulmonary Critical Care Medicine Sleep Medicine

## 2019-11-09 ENCOUNTER — Other Ambulatory Visit (HOSPITAL_COMMUNITY): Payer: Medicare Other

## 2019-11-09 DIAGNOSIS — U071 COVID-19: Secondary | ICD-10-CM | POA: Diagnosis not present

## 2019-11-09 DIAGNOSIS — Z9911 Dependence on respirator [ventilator] status: Secondary | ICD-10-CM | POA: Diagnosis not present

## 2019-11-09 DIAGNOSIS — N186 End stage renal disease: Secondary | ICD-10-CM | POA: Diagnosis not present

## 2019-11-09 DIAGNOSIS — J9621 Acute and chronic respiratory failure with hypoxia: Secondary | ICD-10-CM | POA: Diagnosis not present

## 2019-11-09 LAB — CBC
HCT: 32.1 % — ABNORMAL LOW (ref 36.0–46.0)
Hemoglobin: 9.5 g/dL — ABNORMAL LOW (ref 12.0–15.0)
MCH: 26.1 pg (ref 26.0–34.0)
MCHC: 29.6 g/dL — ABNORMAL LOW (ref 30.0–36.0)
MCV: 88.2 fL (ref 80.0–100.0)
Platelets: 288 10*3/uL (ref 150–400)
RBC: 3.64 MIL/uL — ABNORMAL LOW (ref 3.87–5.11)
RDW: 16.8 % — ABNORMAL HIGH (ref 11.5–15.5)
WBC: 9.2 10*3/uL (ref 4.0–10.5)
nRBC: 0 % (ref 0.0–0.2)

## 2019-11-09 LAB — RENAL FUNCTION PANEL
Albumin: 3 g/dL — ABNORMAL LOW (ref 3.5–5.0)
Anion gap: 20 — ABNORMAL HIGH (ref 5–15)
BUN: 90 mg/dL — ABNORMAL HIGH (ref 8–23)
CO2: 22 mmol/L (ref 22–32)
Calcium: 10.5 mg/dL — ABNORMAL HIGH (ref 8.9–10.3)
Chloride: 97 mmol/L — ABNORMAL LOW (ref 98–111)
Creatinine, Ser: 3.99 mg/dL — ABNORMAL HIGH (ref 0.44–1.00)
GFR, Estimated: 12 mL/min — ABNORMAL LOW (ref >60)
Glucose, Bld: 185 mg/dL — ABNORMAL HIGH (ref 70–99)
Phosphorus: 5.9 mg/dL — ABNORMAL HIGH (ref 2.5–4.6)
Potassium: 4.2 mmol/L (ref 3.5–5.1)
Sodium: 139 mmol/L (ref 135–145)

## 2019-11-09 LAB — BLOOD GAS, ARTERIAL
Acid-Base Excess: 3.2 mmol/L — ABNORMAL HIGH (ref 0.0–2.0)
Bicarbonate: 28.3 mmol/L — ABNORMAL HIGH (ref 20.0–28.0)
FIO2: 50
O2 Saturation: 94.4 %
Patient temperature: 35.7
pCO2 arterial: 48.9 mmHg — ABNORMAL HIGH (ref 32.0–48.0)
pH, Arterial: 7.373 (ref 7.350–7.450)
pO2, Arterial: 65.6 mmHg — ABNORMAL LOW (ref 83.0–108.0)

## 2019-11-09 LAB — TRIGLYCERIDES: Triglycerides: 352 mg/dL — ABNORMAL HIGH (ref <150)

## 2019-11-09 NOTE — Progress Notes (Signed)
[facility]  ROUNDING NOTE   Subjective:  Patient remains critically ill. Quite tachypneic today. Underwent dialysis treatment today. Ultrafiltration achieved was 2 kg.   Objective:  Vital signs in last 24 hours:  Temperature 96.3 pulse 80 respirations 34 blood pressure 146/71  Physical Exam: General:  Critically ill-appearing  Head:  Normocephalic, atraumatic. Moist oral mucosal membranes  Eyes:  Anicteric  Neck:  Tracheostomy in place  Lungs:   Coarse breath sounds bilateral, vent assisted  Heart:  S1S2 no rubs  Abdomen:   Soft, nontender, bowel sounds present  Extremities:  1+ peripheral edema.  Neurologic:  Awake, not following commands  Skin:  No acute rashes  Access:  Temporary right IJ dialysis catheter    Basic Metabolic Panel: Recent Labs  Lab 11/05/19 2052 11/05/19 2052 11/07/19 0757 11/08/19 0518 11/09/19 0450  NA 133*  --  134* 137 139  K 3.5  --  3.9 3.5 4.2  CL 95*  --  96* 97* 97*  CO2 23  --  22 27 22   GLUCOSE 110*  --  146* 168* 185*  BUN 99*  --  127* 60* 90*  CREATININE 5.21*  --  5.87* 3.31* 3.99*  CALCIUM 9.6   < > 10.2 9.9 10.5*  MG 2.4  --  2.7* 2.2  --   PHOS 4.9*  --  7.6* 3.6 5.9*   < > = values in this interval not displayed.    Liver Function Tests: Recent Labs  Lab 11/05/19 2052 11/07/19 0757 11/08/19 0518 11/09/19 0450  AST 19  --   --   --   ALT 14  --   --   --   ALKPHOS 92  --   --   --   BILITOT 1.0  --   --   --   PROT 5.3*  --   --   --   ALBUMIN 2.3* 2.6* 2.9* 3.0*   No results for input(s): LIPASE, AMYLASE in the last 168 hours. No results for input(s): AMMONIA in the last 168 hours.  CBC: Recent Labs  Lab 11/05/19 2052 11/07/19 0757 11/08/19 0518 11/09/19 0450  WBC 10.5 9.9 11.2* 9.2  NEUTROABS 7.1  --   --   --   HGB 9.2* 9.6* 10.2* 9.5*  HCT 30.6* 31.7* 33.6* 32.1*  MCV 88.2 87.3 87.0 88.2  PLT 269 227 278 288    Cardiac Enzymes: No results for input(s): CKTOTAL, CKMB, CKMBINDEX,  TROPONINI in the last 168 hours.  BNP: Invalid input(s): POCBNP  CBG: No results for input(s): GLUCAP in the last 168 hours.  Microbiology: Results for orders placed or performed during the hospital encounter of 11/05/19  Culture, respiratory (non-expectorated)     Status: None   Collection Time: 11/06/19  1:46 PM   Specimen: Tracheal Aspirate; Respiratory  Result Value Ref Range Status   Specimen Description TRACHEAL ASPIRATE  Final   Special Requests NONE  Final   Gram Stain   Final    RARE WBC PRESENT, PREDOMINANTLY PMN NO ORGANISMS SEEN    Culture   Final    RARE Normal respiratory flora-no Staph aureus or Pseudomonas seen Performed at [facility] [address] [place], [place] [address]    Report Status 11/08/2019 FINAL  Final    Coagulation Studies: No results for input(s): LABPROT, INR in the last 72 hours.  Urinalysis: No results for input(s): COLORURINE, LABSPEC, PHURINE, GLUCOSEU, HGBUR, BILIRUBINUR, KETONESUR, PROTEINUR, UROBILINOGEN, NITRITE, LEUKOCYTESUR in the  last 72 hours.  Invalid input(s): APPERANCEUR    Imaging: DG CHEST PORT 1 VIEW  Result Date: 11/08/2019 CLINICAL DATA:  Respiratory failure EXAM: PORTABLE CHEST 1 VIEW COMPARISON:  November 08, 2019 FINDINGS: Tracheostomy catheter tip is 3.9 cm above the carina. Dual lumen central catheter tip is in the superior vena cava near the cavoatrial junction. Single lumen central catheter tip is in the right atrium slightly beyond the cavoatrial junction. No pneumothorax. There is widespread airspace opacity bilaterally with layering pleural effusions. Heart is mildly enlarged with pulmonary vascularity normal. No adenopathy. No bone lesions. IMPRESSION: Tube and catheter positions as described without pneumothorax. Widespread airspace opacity bilaterally with superimposed pleural effusions. Question pulmonary edema versus multifocal pneumonia. Both entities may be present concurrently. A degree of  underlying ARDS is also possible. Appearance similar to earlier in the day. Electronically Signed   By: Lowella Grip III M.D.   On: 11/08/2019 15:16   DG CHEST PORT 1 VIEW  Result Date: 11/08/2019 CLINICAL DATA:  Ventilator dependence. EXAM: PORTABLE CHEST 1 VIEW COMPARISON:  11/07/2019 FINDINGS: 0645 hours. Low lung volumes with persistent diffuse bilateral airspace disease. Tracheostomy tube again noted. 2 right-sided central lines again noted with tip position over the SVC/RA junction and right atrium near the tricuspid valve, respectively. Telemetry leads overlie the chest. IMPRESSION: Stable exam. Diffuse bilateral airspace disease. The smaller lumen right-sided central line tip projects in the region of the tricuspid valve. Electronically Signed   By: Misty Stanley M.D.   On: 11/08/2019 07:22     Medications:       Assessment/ Plan:  69 y.o. female with a PMHx of diabetes mellitus type 2, hyperlipidemia, plantar fasciitis, B12 deficiency, chronic low back pain, restless leg syndrome, GERD, nonalcoholic fatty liver disease, anxiety, nephrolithiasis, irritable bowel syndrome, hypertension, chronic kidney disease stage IIIa, who was admitted to [facility] on 11/05/2019 for ongoing treatment of post COVID-19 recovery.  1.  Acute kidney injury/chronic kidney disease stage IIIa.    Patient underwent dialysis treatment today.  Ultrafiltration achieved was 2 kg.  We will plan for additional dialysis treatment on Friday.  2.  Acute respiratory failure secondary to COVID-19 infection.    Significant tachypnea noted.  Ultrafiltration was increased to 2 kg.  Continue to monitor respiratory status closely.  3.  Anemia of chronic kidney disease.    Continue Retacrit.  Hemoglobin currently 9.5.  4.  Secondary hyperparathyroidism.  Phosphorus 5.9 earlier today.  Should come down with ongoing dialysis treatments.   LOS: 0 Naviyah Schaffert 10/27/20211:21 PM

## 2019-11-09 NOTE — Progress Notes (Signed)
Pulmonary Critical Care Medicine [place]   PULMONARY CRITICAL CARE SERVICE  PROGRESS NOTE  Date of Service: 11/09/2019  Jerica Creegan  KCL:[id number]  DOB: 1950-08-17   DOA: 11/05/2019  Referring Physician: Merton Border, MD  HPI: Marly Schuld is a 69 y.o. female seen for follow up of Acute on Chronic Respiratory Failure.  Patient is critically ill remains on the ventilator.  Patient had hemodialysis today with 2 kg removed  Medications: Reviewed on Rounds  Physical Exam:  Vitals: Temperature is 96.3 pulse 80 respiratory rate 34 blood pressure is 146/71 saturations 99%  Ventilator Settings on assist control FiO2 50% PEEP 8 tidal volume 420  . General: Comfortable at this time . Eyes: Grossly normal lids, irises & conjunctiva . ENT: grossly tongue is normal . Neck: no obvious mass . Cardiovascular: S1 S2 normal no gallop . Respiratory: No rhonchi very coarse breath sounds . Abdomen: soft . Skin: no rash seen on limited exam . Musculoskeletal: not rigid . Psychiatric:unable to assess . Neurologic: no seizure no involuntary movements         Lab Data:   Basic Metabolic Panel: Recent Labs  Lab 11/05/19 2052 11/07/19 0757 11/08/19 0518 11/09/19 0450  NA 133* 134* 137 139  K 3.5 3.9 3.5 4.2  CL 95* 96* 97* 97*  CO2 23 22 27 22   GLUCOSE 110* 146* 168* 185*  BUN 99* 127* 60* 90*  CREATININE 5.21* 5.87* 3.31* 3.99*  CALCIUM 9.6 10.2 9.9 10.5*  MG 2.4 2.7* 2.2  --   PHOS 4.9* 7.6* 3.6 5.9*    ABG: Recent Labs  Lab 11/05/19 1236 11/05/19 1445 11/07/19 0845 11/08/19 0543 11/09/19 1402  PHART 7.275* 7.266* 7.220* 7.314* 7.373  PCO2ART 53.3* 54.6* 57.6* 57.4* 48.9*  PO2ART 86.9 104 115* 80.2* 65.6*  HCO3 24.0 24.0 22.8 28.5* 28.3*  O2SAT 95.6 97.2 97.7 95.8 94.4    Liver Function Tests: Recent Labs  Lab 11/05/19 2052 11/07/19 0757 11/08/19 0518 11/09/19 0450  AST 19  --   --   --   ALT 14  --   --   --   ALKPHOS 92  --   --    --   BILITOT 1.0  --   --   --   PROT 5.3*  --   --   --   ALBUMIN 2.3* 2.6* 2.9* 3.0*   No results for input(s): LIPASE, AMYLASE in the last 168 hours. No results for input(s): AMMONIA in the last 168 hours.  CBC: Recent Labs  Lab 11/05/19 2052 11/07/19 0757 11/08/19 0518 11/09/19 0450  WBC 10.5 9.9 11.2* 9.2  NEUTROABS 7.1  --   --   --   HGB 9.2* 9.6* 10.2* 9.5*  HCT 30.6* 31.7* 33.6* 32.1*  MCV 88.2 87.3 87.0 88.2  PLT 269 227 278 288    Cardiac Enzymes: No results for input(s): CKTOTAL, CKMB, CKMBINDEX, TROPONINI in the last 168 hours.  BNP (last 3 results) No results for input(s): BNP in the last 8760 hours.  ProBNP (last 3 results) No results for input(s): PROBNP in the last 8760 hours.  Radiological Exams: DG CHEST PORT 1 VIEW  Result Date: 11/09/2019 CLINICAL DATA:  Pneumonia. EXAM: PORTABLE CHEST 1 VIEW COMPARISON:  November 08, 2019. FINDINGS: Stable cardiomediastinal silhouette. Tracheostomy tube is unchanged in position. Right internal jugular catheters are unchanged in position. No pneumothorax or pleural effusion is noted. Stable bilateral lung opacities are noted concerning for multifocal pneumonia. Bony thorax is unremarkable.  IMPRESSION: Stable bilateral lung opacities are noted concerning for multifocal pneumonia. Electronically Signed   By: Marijo Conception M.D.   On: 11/09/2019 13:44   DG CHEST PORT 1 VIEW  Result Date: 11/08/2019 CLINICAL DATA:  Respiratory failure EXAM: PORTABLE CHEST 1 VIEW COMPARISON:  November 08, 2019 FINDINGS: Tracheostomy catheter tip is 3.9 cm above the carina. Dual lumen central catheter tip is in the superior vena cava near the cavoatrial junction. Single lumen central catheter tip is in the right atrium slightly beyond the cavoatrial junction. No pneumothorax. There is widespread airspace opacity bilaterally with layering pleural effusions. Heart is mildly enlarged with pulmonary vascularity normal. No adenopathy. No bone  lesions. IMPRESSION: Tube and catheter positions as described without pneumothorax. Widespread airspace opacity bilaterally with superimposed pleural effusions. Question pulmonary edema versus multifocal pneumonia. Both entities may be present concurrently. A degree of underlying ARDS is also possible. Appearance similar to earlier in the day. Electronically Signed   By: Lowella Grip III M.D.   On: 11/08/2019 15:16   DG CHEST PORT 1 VIEW  Result Date: 11/08/2019 CLINICAL DATA:  Ventilator dependence. EXAM: PORTABLE CHEST 1 VIEW COMPARISON:  11/07/2019 FINDINGS: 0645 hours. Low lung volumes with persistent diffuse bilateral airspace disease. Tracheostomy tube again noted. 2 right-sided central lines again noted with tip position over the SVC/RA junction and right atrium near the tricuspid valve, respectively. Telemetry leads overlie the chest. IMPRESSION: Stable exam. Diffuse bilateral airspace disease. The smaller lumen right-sided central line tip projects in the region of the tricuspid valve. Electronically Signed   By: Misty Stanley M.D.   On: 11/08/2019 07:22    Assessment/Plan Active Problems:   Acute on chronic respiratory failure with hypoxia (HCC)   End stage renal disease on dialysis ([place])   COVID-19 virus infection   Pneumonia due to COVID-19 virus   1. Acute on chronic respiratory failure with hypoxia we will continue with assist control mode for now as patient's volume status is being adjusted. 2. End-stage renal failure on hemodialysis supportive care prognosis guarded 3. COVID-19 virus infection severe infection with residual pulmonary damage 4. Pneumonia due to COVID-19 chest x-ray showing diffuse pneumonic process   I have personally seen and evaluated the patient, evaluated laboratory and imaging results, formulated the assessment and plan and placed orders. The Patient requires high complexity decision making with multiple systems involvement.  Rounds were done with the  Respiratory Therapy Director and Staff therapists and discussed with nursing staff also.  Allyne Gee, MD [facility] Pulmonary Critical Care Medicine Sleep Medicine

## 2019-11-10 DIAGNOSIS — N186 End stage renal disease: Secondary | ICD-10-CM | POA: Diagnosis not present

## 2019-11-10 DIAGNOSIS — Z9911 Dependence on respirator [ventilator] status: Secondary | ICD-10-CM | POA: Diagnosis not present

## 2019-11-10 DIAGNOSIS — U071 COVID-19: Secondary | ICD-10-CM | POA: Diagnosis not present

## 2019-11-10 DIAGNOSIS — J9621 Acute and chronic respiratory failure with hypoxia: Secondary | ICD-10-CM | POA: Diagnosis not present

## 2019-11-10 LAB — URINALYSIS, ROUTINE W REFLEX MICROSCOPIC
Bilirubin Urine: NEGATIVE
Glucose, UA: NEGATIVE mg/dL
Ketones, ur: NEGATIVE mg/dL
Nitrite: NEGATIVE
Protein, ur: 100 mg/dL — AB
RBC / HPF: >50 RBC/hpf — ABNORMAL HIGH (ref 0–5)
Specific Gravity, Urine: 1.012 (ref 1.005–1.030)
WBC, UA: >50 WBC/hpf — ABNORMAL HIGH (ref 0–5)
pH: 5 (ref 5.0–8.0)

## 2019-11-10 NOTE — Progress Notes (Signed)
Pulmonary Critical Care Medicine [place]   PULMONARY CRITICAL CARE SERVICE  PROGRESS NOTE  Date of Service: 11/10/2019  Veronica Melton  EPP:[id number]  DOB: 1950/09/20   DOA: 11/05/2019  Referring Physician: Merton Border, MD  HPI: Veronica Melton is a 69 y.o. female seen for follow up of Acute on Chronic Respiratory Failure.  Patient currently is on the ventilator and full support assist control mode right now is on 40% FiO2 continues to do quite poorly been seen by nephrology for dialysis  Medications: Reviewed on Rounds  Physical Exam:  Vitals: Temperature 97.5 pulse 76 respiratory rate 27 blood pressure is 146/71 saturations 99%  Ventilator Settings on assist control FiO2 40% tidal volume 420 with a PEEP of 8  . General: Comfortable at this time . Eyes: Grossly normal lids, irises & conjunctiva . ENT: grossly tongue is normal . Neck: no obvious mass . Cardiovascular: S1 S2 normal no gallop . Respiratory: No rhonchi very coarse breath sounds . Abdomen: soft . Skin: no rash seen on limited exam . Musculoskeletal: not rigid . Psychiatric:unable to assess . Neurologic: no seizure no involuntary movements         Lab Data:   Basic Metabolic Panel: Recent Labs  Lab 11/05/19 2052 11/07/19 0757 11/08/19 0518 11/09/19 0450  NA 133* 134* 137 139  K 3.5 3.9 3.5 4.2  CL 95* 96* 97* 97*  CO2 23 22 27 22   GLUCOSE 110* 146* 168* 185*  BUN 99* 127* 60* 90*  CREATININE 5.21* 5.87* 3.31* 3.99*  CALCIUM 9.6 10.2 9.9 10.5*  MG 2.4 2.7* 2.2  --   PHOS 4.9* 7.6* 3.6 5.9*    ABG: Recent Labs  Lab 11/05/19 1236 11/05/19 1445 11/07/19 0845 11/08/19 0543 11/09/19 1402  PHART 7.275* 7.266* 7.220* 7.314* 7.373  PCO2ART 53.3* 54.6* 57.6* 57.4* 48.9*  PO2ART 86.9 104 115* 80.2* 65.6*  HCO3 24.0 24.0 22.8 28.5* 28.3*  O2SAT 95.6 97.2 97.7 95.8 94.4    Liver Function Tests: Recent Labs  Lab 11/05/19 2052 11/07/19 0757 11/08/19 0518 11/09/19 0450   AST 19  --   --   --   ALT 14  --   --   --   ALKPHOS 92  --   --   --   BILITOT 1.0  --   --   --   PROT 5.3*  --   --   --   ALBUMIN 2.3* 2.6* 2.9* 3.0*   No results for input(s): LIPASE, AMYLASE in the last 168 hours. No results for input(s): AMMONIA in the last 168 hours.  CBC: Recent Labs  Lab 11/05/19 2052 11/07/19 0757 11/08/19 0518 11/09/19 0450  WBC 10.5 9.9 11.2* 9.2  NEUTROABS 7.1  --   --   --   HGB 9.2* 9.6* 10.2* 9.5*  HCT 30.6* 31.7* 33.6* 32.1*  MCV 88.2 87.3 87.0 88.2  PLT 269 227 278 288    Cardiac Enzymes: No results for input(s): CKTOTAL, CKMB, CKMBINDEX, TROPONINI in the last 168 hours.  BNP (last 3 results) No results for input(s): BNP in the last 8760 hours.  ProBNP (last 3 results) No results for input(s): PROBNP in the last 8760 hours.  Radiological Exams: DG CHEST PORT 1 VIEW  Result Date: 11/09/2019 CLINICAL DATA:  Pneumonia. EXAM: PORTABLE CHEST 1 VIEW COMPARISON:  November 08, 2019. FINDINGS: Stable cardiomediastinal silhouette. Tracheostomy tube is unchanged in position. Right internal jugular catheters are unchanged in position. No pneumothorax or pleural effusion is noted.  Stable bilateral lung opacities are noted concerning for multifocal pneumonia. Bony thorax is unremarkable. IMPRESSION: Stable bilateral lung opacities are noted concerning for multifocal pneumonia. Electronically Signed   By: Marijo Conception M.D.   On: 11/09/2019 13:44   DG CHEST PORT 1 VIEW  Result Date: 11/08/2019 CLINICAL DATA:  Respiratory failure EXAM: PORTABLE CHEST 1 VIEW COMPARISON:  November 08, 2019 FINDINGS: Tracheostomy catheter tip is 3.9 cm above the carina. Dual lumen central catheter tip is in the superior vena cava near the cavoatrial junction. Single lumen central catheter tip is in the right atrium slightly beyond the cavoatrial junction. No pneumothorax. There is widespread airspace opacity bilaterally with layering pleural effusions. Heart is mildly  enlarged with pulmonary vascularity normal. No adenopathy. No bone lesions. IMPRESSION: Tube and catheter positions as described without pneumothorax. Widespread airspace opacity bilaterally with superimposed pleural effusions. Question pulmonary edema versus multifocal pneumonia. Both entities may be present concurrently. A degree of underlying ARDS is also possible. Appearance similar to earlier in the day. Electronically Signed   By: Lowella Grip III M.D.   On: 11/08/2019 15:16    Assessment/Plan Active Problems:   Acute on chronic respiratory failure with hypoxia (HCC)   End stage renal disease on dialysis ([place])   COVID-19 virus infection   Pneumonia due to COVID-19 virus   1. Acute on chronic respiratory failure with hypoxia we will continue with full support on the ventilator.  She is not a candidate for weaning at this time we will continue to monitor closely. 2. End-stage renal failure on hemodialysis continue with care 3. COVID-19 virus infection in recovery 4. Pneumonia due to COVID-19 we will continue with supportive care   I have personally seen and evaluated the patient, evaluated laboratory and imaging results, formulated the assessment and plan and placed orders. The Patient requires high complexity decision making with multiple systems involvement.  Rounds were done with the Respiratory Therapy Director and Staff therapists and discussed with nursing staff also.  Allyne Gee, MD [facility] Pulmonary Critical Care Medicine Sleep Medicine

## 2019-11-10 NOTE — Consult Note (Signed)
Referring Physician: A. Laren Everts, MD  Veronica Melton is an 69 y.o. female.                       Chief Complaint: Bradycardia  HPI: 69 years old female with PMH of type 2 Dm, hyperlipidemia, restless leg syndrome, hypertension, GERD, obesity had COVID-19 pneumonia requiring intubation and treated with Remdesivir and Decadron. She has tracheostomy and PEG tube. She has monitor strip showing sinus bradycardia and sinus pause. She is not on B-blocker or calcium channel blocker.   Past Medical History:  Diagnosis Date  . Acute on chronic respiratory failure with hypoxia ([place])   . COVID-19 virus infection   . End stage renal disease on dialysis ([place])   . Pneumonia due to COVID-19 virus     PSH: Tracheostomy           PEG tube placement  The histories are not reviewed yet. Please review them in the "History" navigator section and refresh this [place].  Family history: non-contributory.  Social History:  has no history on file for tobacco use, alcohol use, and drug use.  Allergies: Contrast, lisinopril, diclofenac and gabapentin  No medications prior to admission.  Methylprednisone 40 mg. q 6 hr. Amantadine 100 mg. Daily Calcium acetate 667 mg. Tid Fish Oil 6000 mg. Daily Heparin 5000 units bid Hydroxyurea 500 mg. Bid Ipratropium-Albuterol 0.5-2.5 nebulizer tx q 6 hr. Melatonin 3 mg. q HS Pantoprazole 40 mg. Bid Retacrit 10000 units MWF Sertraline 200 mg. Daily Vitamin A daily  Results for orders placed or performed during the hospital encounter of 11/05/19 (from the past 48 hour(s))  CBC     Status: Abnormal   Collection Time: 11/08/19  5:18 AM  Result Value Ref Range   WBC 11.2 (H) 4.0 - 10.5 K/uL   RBC 3.86 (L) 3.87 - 5.11 MIL/uL   Hemoglobin 10.2 (L) 12.0 - 15.0 g/dL   HCT 33.6 (L) 36 - 46 %   MCV 87.0 80.0 - 100.0 fL   MCH 26.4 26.0 - 34.0 pg   MCHC 30.4 30.0 - 36.0 g/dL   RDW 16.6 (H) 11.5 - 15.5 %   Platelets 278 150 - 400 K/uL   nRBC 0.0 0.0 - 0.2 %    Comment:  Performed at [facility], [place] [address] [place], [place] [address]  Magnesium     Status: None   Collection Time: 11/08/19  5:18 AM  Result Value Ref Range   Magnesium 2.2 1.7 - 2.4 mg/dL    Comment: Performed at [facility] [address], [place],  [address]  Renal function panel     Status: Abnormal   Collection Time: 11/08/19  5:18 AM  Result Value Ref Range   Sodium 137 135 - 145 mmol/L   Potassium 3.5 3.5 - 5.1 mmol/L   Chloride 97 (L) 98 - 111 mmol/L   CO2 27 22 - 32 mmol/L   Glucose, Bld 168 (H) 70 - 99 mg/dL    Comment: Glucose reference range applies only to samples taken after fasting for at least 8 hours.   BUN 60 (H) 8 - 23 mg/dL   Creatinine, Ser 3.31 (H) 0.44 - 1.00 mg/dL    Comment: DELTA CHECK NOTED   Calcium 9.9 8.9 - 10.3 mg/dL   Phosphorus 3.6 2.5 - 4.6 mg/dL   Albumin 2.9 (L) 3.5 - 5.0 g/dL   GFR, Estimated 14 (L) >60 mL/min    Comment: (NOTE)  Calculated using the CKD-EPI Creatinine Equation (2021)    Anion gap 13 5 - 15    Comment: Performed at [facility], [place] [address] [place], [place] [address]  Blood gas, arterial     Status: Abnormal   Collection Time: 11/08/19  5:43 AM  Result Value Ref Range   FIO2 50.00    pH, Arterial 7.314 (L) 7.35 - 7.45   pCO2 arterial 57.4 (H) 32 - 48 mmHg   pO2, Arterial 80.2 (L) 83 - 108 mmHg   Bicarbonate 28.5 (H) 20.0 - 28.0 mmol/L   Acid-Base Excess 2.8 (H) 0.0 - 2.0 mmol/L   O2 Saturation 95.8 %   Patient temperature 36.4    Collection site RIGHT RADIAL    Drawn by Rosaland Lao, RRT    Sample type ARTERIAL DRAW    Allens test (pass/fail) PASS PASS    Comment: Performed at [facility], [place] [address] [place], [place] [address]  CBC     Status: Abnormal   Collection Time: 11/09/19  4:50 AM  Result Value Ref Range   WBC 9.2 4.0 - 10.5 K/uL   RBC 3.64 (L) 3.87 - 5.11 MIL/uL   Hemoglobin 9.5 (L) 12.0 - 15.0 g/dL   HCT 32.1 (L) 36 - 46 %   MCV 88.2 80.0 - 100.0 fL   MCH 26.1  26.0 - 34.0 pg   MCHC 29.6 (L) 30.0 - 36.0 g/dL   RDW 16.8 (H) 11.5 - 15.5 %   Platelets 288 150 - 400 K/uL   nRBC 0.0 0.0 - 0.2 %    Comment: Performed at [facility], [place] [address], [place], [place] [address]  Renal function panel     Status: Abnormal   Collection Time: 11/09/19  4:50 AM  Result Value Ref Range   Sodium 139 135 - 145 mmol/L   Potassium 4.2 3.5 - 5.1 mmol/L   Chloride 97 (L) 98 - 111 mmol/L   CO2 22 22 - 32 mmol/L   Glucose, Bld 185 (H) 70 - 99 mg/dL    Comment: Glucose reference range applies only to samples taken after fasting for at least 8 hours.   BUN 90 (H) 8 - 23 mg/dL   Creatinine, Ser 3.99 (H) 0.44 - 1.00 mg/dL   Calcium 10.5 (H) 8.9 - 10.3 mg/dL   Phosphorus 5.9 (H) 2.5 - 4.6 mg/dL   Albumin 3.0 (L) 3.5 - 5.0 g/dL   GFR, Estimated 12 (L) >60 mL/min    Comment: (NOTE) Calculated using the CKD-EPI Creatinine Equation (2021)    Anion gap 20 (H) 5 - 15    Comment: Performed at [facility] [address] [place], [place] [address]  Triglycerides     Status: Abnormal   Collection Time: 11/09/19  4:50 AM  Result Value Ref Range   Triglycerides 352 (H) <150 mg/dL    Comment: Performed at [facility] [address] [place], [place] [address]  Blood gas, arterial     Status: Abnormal   Collection Time: 11/09/19  2:02 PM  Result Value Ref Range   FIO2 50.00    pH, Arterial 7.373 7.35 - 7.45   pCO2 arterial 48.9 (H) 32 - 48 mmHg   pO2, Arterial 65.6 (L) 83 - 108 mmHg   Bicarbonate 28.3 (H) 20.0 - 28.0 mmol/L   Acid-Base Excess 3.2 (H) 0.0 - 2.0 mmol/L   O2 Saturation 94.4 %   Patient temperature 35.7  Collection site RIGHT RADIAL    Drawn by COLLECTED BY RT     Comment: N PAYNE   Sample type ARTERIAL DRAW    Allens test (pass/fail) PASS PASS    Comment: Performed at [facility], [place] [address], [place], [place] [address]   DG CHEST PORT 1 VIEW  Result Date: 11/09/2019 CLINICAL DATA:  Pneumonia. EXAM: PORTABLE CHEST  1 VIEW COMPARISON:  November 08, 2019. FINDINGS: Stable cardiomediastinal silhouette. Tracheostomy tube is unchanged in position. Right internal jugular catheters are unchanged in position. No pneumothorax or pleural effusion is noted. Stable bilateral lung opacities are noted concerning for multifocal pneumonia. Bony thorax is unremarkable. IMPRESSION: Stable bilateral lung opacities are noted concerning for multifocal pneumonia. Electronically Signed   By: Marijo Conception M.D.   On: 11/09/2019 13:44   DG CHEST PORT 1 VIEW  Result Date: 11/08/2019 CLINICAL DATA:  Respiratory failure EXAM: PORTABLE CHEST 1 VIEW COMPARISON:  November 08, 2019 FINDINGS: Tracheostomy catheter tip is 3.9 cm above the carina. Dual lumen central catheter tip is in the superior vena cava near the cavoatrial junction. Single lumen central catheter tip is in the right atrium slightly beyond the cavoatrial junction. No pneumothorax. There is widespread airspace opacity bilaterally with layering pleural effusions. Heart is mildly enlarged with pulmonary vascularity normal. No adenopathy. No bone lesions. IMPRESSION: Tube and catheter positions as described without pneumothorax. Widespread airspace opacity bilaterally with superimposed pleural effusions. Question pulmonary edema versus multifocal pneumonia. Both entities may be present concurrently. A degree of underlying ARDS is also possible. Appearance similar to earlier in the day. Electronically Signed   By: Lowella Grip III M.D.   On: 11/08/2019 15:16   DG CHEST PORT 1 VIEW  Result Date: 11/08/2019 CLINICAL DATA:  Ventilator dependence. EXAM: PORTABLE CHEST 1 VIEW COMPARISON:  11/07/2019 FINDINGS: 0645 hours. Low lung volumes with persistent diffuse bilateral airspace disease. Tracheostomy tube again noted. 2 right-sided central lines again noted with tip position over the SVC/RA junction and right atrium near the tricuspid valve, respectively. Telemetry leads overlie the  chest. IMPRESSION: Stable exam. Diffuse bilateral airspace disease. The smaller lumen right-sided central line tip projects in the region of the tricuspid valve. Electronically Signed   By: Misty Stanley M.D.   On: 11/08/2019 07:22    Review Of Systems As per HPI. P:80, R: 34, BP: 148/71, O2 sat: 99 %, T: 96.30 degree F. FiO2 50 %, PEEP 8, tidal volume 420 There is no height or weight on file to calculate BMI. General appearance: Sedated, cooperative, appears stated age and significant respiratory distress Head: Normocephalic, atraumatic. Eyes: Blue eyes, pale pink conjunctiva, corneas clear.  Neck: No adenopathy, no carotid bruit, no JVD, supple, symmetrical, tracheostomy in place. Resp: Diffuse rhonchi to auscultation bilaterally. Cardio: Regular rate and rhythm, S1, S2 normal, II/VI systolic murmur, no click, rub or gallop GI: Soft, non-tender; bowel sounds normal; no organomegaly. Extremities: 1 + edema, no cyanosis or clubbing. Skin: Warm and dry.  Neurologic: Alert and oriented X 0.  Assessment/Plan Episode of sinus bradycardia Acute on chronic respiratory failure with hypoxia Type 2 DM Hypertension Hyperlipidemia Acute kidney injury / CKD, IV Anemia of chronic disease Secondary hyperparathyroidism Nonalcoholic fatty liver disease  Continue monitoring for hemodynamic instability. Echocardiogram for LV function  Time spent: Review of old records, Lab, x-rays, EKG, other cardiac tests, examination, discussion with patient's tech, nurse and referring doctor over 70 minutes.  Birdie Riddle, MD  11/10/2019, 5:03 AM

## 2019-11-11 ENCOUNTER — Other Ambulatory Visit (HOSPITAL_COMMUNITY): Payer: Medicare Other

## 2019-11-11 DIAGNOSIS — N186 End stage renal disease: Secondary | ICD-10-CM | POA: Diagnosis not present

## 2019-11-11 DIAGNOSIS — J9621 Acute and chronic respiratory failure with hypoxia: Secondary | ICD-10-CM | POA: Diagnosis not present

## 2019-11-11 DIAGNOSIS — Z9911 Dependence on respirator [ventilator] status: Secondary | ICD-10-CM | POA: Diagnosis not present

## 2019-11-11 DIAGNOSIS — U071 COVID-19: Secondary | ICD-10-CM | POA: Diagnosis not present

## 2019-11-11 LAB — BLOOD GAS, ARTERIAL
Acid-Base Excess: 1.3 mmol/L (ref 0.0–2.0)
Acid-Base Excess: 1.3 mmol/L (ref 0.0–2.0)
Bicarbonate: 26.3 mmol/L (ref 20.0–28.0)
Bicarbonate: 26.7 mmol/L (ref 20.0–28.0)
FIO2: 45
FIO2: 60
O2 Saturation: 97.7 %
O2 Saturation: 99.1 %
Patient temperature: 36.6
Patient temperature: 36.5
pCO2 arterial: 48.6 mmHg — ABNORMAL HIGH (ref 32.0–48.0)
pCO2 arterial: 51.8 mmHg — ABNORMAL HIGH (ref 32.0–48.0)
pH, Arterial: 7.351 (ref 7.350–7.450)
pH, Arterial: 7.33 — ABNORMAL LOW (ref 7.350–7.450)
pO2, Arterial: 102 mmHg (ref 83.0–108.0)
pO2, Arterial: 190 mmHg — ABNORMAL HIGH (ref 83.0–108.0)

## 2019-11-11 LAB — CBC
HCT: 32.2 % — ABNORMAL LOW (ref 36.0–46.0)
Hemoglobin: 9.7 g/dL — ABNORMAL LOW (ref 12.0–15.0)
MCH: 26.9 pg (ref 26.0–34.0)
MCHC: 30.1 g/dL (ref 30.0–36.0)
MCV: 89.4 fL (ref 80.0–100.0)
Platelets: 379 10*3/uL (ref 150–400)
RBC: 3.6 MIL/uL — ABNORMAL LOW (ref 3.87–5.11)
RDW: 17.7 % — ABNORMAL HIGH (ref 11.5–15.5)
WBC: 10.8 10*3/uL — ABNORMAL HIGH (ref 4.0–10.5)
nRBC: 0 % (ref 0.0–0.2)

## 2019-11-11 LAB — RENAL FUNCTION PANEL
Albumin: 3.6 g/dL (ref 3.5–5.0)
Anion gap: 17 — ABNORMAL HIGH (ref 5–15)
BUN: 92 mg/dL — ABNORMAL HIGH (ref 8–23)
CO2: 26 mmol/L (ref 22–32)
Calcium: 10.3 mg/dL (ref 8.9–10.3)
Chloride: 93 mmol/L — ABNORMAL LOW (ref 98–111)
Creatinine, Ser: 3.28 mg/dL — ABNORMAL HIGH (ref 0.44–1.00)
GFR, Estimated: 15 mL/min — ABNORMAL LOW (ref >60)
Glucose, Bld: 125 mg/dL — ABNORMAL HIGH (ref 70–99)
Phosphorus: 5.5 mg/dL — ABNORMAL HIGH (ref 2.5–4.6)
Potassium: 3.5 mmol/L (ref 3.5–5.1)
Sodium: 136 mmol/L (ref 135–145)

## 2019-11-11 LAB — URINE CULTURE: Culture: 60000 — AB

## 2019-11-11 LAB — MAGNESIUM: Magnesium: 2.3 mg/dL (ref 1.7–2.4)

## 2019-11-11 NOTE — Progress Notes (Signed)
[facility]  ROUNDING NOTE   Subjective:  Rapid response called earlier in the day. Patient was unstable earlier. We therefore held dialysis given instability.    Objective:  Vital signs in last 24 hours:  Temperature 97.9 pulse 87 respirations 32 blood pressure 138/72  Physical Exam: General:  Critically ill-appearing  Head:  Normocephalic, atraumatic. Moist oral mucosal membranes  Eyes:  Anicteric  Neck:  Tracheostomy in place  Lungs:   Coarse breath sounds bilateral, vent assisted  Heart:  S1S2 no rubs  Abdomen:   Soft, nontender, bowel sounds present  Extremities:  1+ peripheral edema.  Neurologic:  Awake, not following commands  Skin:  No acute rashes  Access:  Temporary right IJ dialysis catheter    Basic Metabolic Panel: Recent Labs  Lab 11/05/19 2052 11/05/19 2052 11/07/19 0757 11/07/19 0757 11/08/19 0518 11/09/19 0450 11/11/19 0437  NA 133*  --  134*  --  137 139 136  K 3.5  --  3.9  --  3.5 4.2 3.5  CL 95*  --  96*  --  97* 97* 93*  CO2 23  --  22  --  27 22 26   GLUCOSE 110*  --  146*  --  168* 185* 125*  BUN 99*  --  127*  --  60* 90* 92*  CREATININE 5.21*  --  5.87*  --  3.31* 3.99* 3.28*  CALCIUM 9.6   < > 10.2   < > 9.9 10.5* 10.3  MG 2.4  --  2.7*  --  2.2  --  2.3  PHOS 4.9*  --  7.6*  --  3.6 5.9* 5.5*   < > = values in this interval not displayed.    Liver Function Tests: Recent Labs  Lab 11/05/19 2052 11/07/19 0757 11/08/19 0518 11/09/19 0450 11/11/19 0437  AST 19  --   --   --   --   ALT 14  --   --   --   --   ALKPHOS 92  --   --   --   --   BILITOT 1.0  --   --   --   --   PROT 5.3*  --   --   --   --   ALBUMIN 2.3* 2.6* 2.9* 3.0* 3.6   No results for input(s): LIPASE, AMYLASE in the last 168 hours. No results for input(s): AMMONIA in the last 168 hours.  CBC: Recent Labs  Lab 11/05/19 2052 11/07/19 0757 11/08/19 0518 11/09/19 0450 11/11/19 0437  WBC 10.5 9.9 11.2* 9.2 10.8*  NEUTROABS 7.1  --   --   --    --   HGB 9.2* 9.6* 10.2* 9.5* 9.7*  HCT 30.6* 31.7* 33.6* 32.1* 32.2*  MCV 88.2 87.3 87.0 88.2 89.4  PLT 269 227 278 288 379    Cardiac Enzymes: No results for input(s): CKTOTAL, CKMB, CKMBINDEX, TROPONINI in the last 168 hours.  BNP: Invalid input(s): POCBNP  CBG: No results for input(s): GLUCAP in the last 168 hours.  Microbiology: Results for orders placed or performed during the hospital encounter of 11/05/19  Culture, respiratory (non-expectorated)     Status: None   Collection Time: 11/06/19  1:46 PM   Specimen: Tracheal Aspirate; Respiratory  Result Value Ref Range Status   Specimen Description TRACHEAL ASPIRATE  Final   Special Requests NONE  Final   Gram Stain   Final    RARE WBC PRESENT, PREDOMINANTLY PMN NO ORGANISMS SEEN  Culture   Final    RARE Normal respiratory flora-no Staph aureus or Pseudomonas seen Performed at [facility] [address], [place], [place] [address]    Report Status 11/08/2019 FINAL  Final  Urine Culture     Status: Abnormal   Collection Time: 11/10/19  1:36 PM   Specimen: Urine, Random  Result Value Ref Range Status   Specimen Description URINE, RANDOM  Final   Special Requests   Final    NONE Performed at [facility], [place] [address], [place], [place] [address]    Culture 60,000 COLONIES/mL YEAST (A)  Final   Report Status 11/11/2019 FINAL  Final    Coagulation Studies: No results for input(s): LABPROT, INR in the last 72 hours.  Urinalysis: Recent Labs    11/10/19 1336  COLORURINE AMBER*  LABSPEC 1.012  PHURINE 5.0  GLUCOSEU NEGATIVE  HGBUR SMALL*  BILIRUBINUR NEGATIVE  KETONESUR NEGATIVE  PROTEINUR 100*  NITRITE NEGATIVE  LEUKOCYTESUR MODERATE*      Imaging: DG CHEST PORT 1 VIEW  Result Date: 11/11/2019 CLINICAL DATA:  Pneumonia. EXAM: PORTABLE CHEST 1 VIEW COMPARISON:  11/09/2019 FINDINGS: 0550 hours. Tracheostomy tube remains in place. 2 right IJ central lines are stable. Low volumes with  cardiomegaly. Diffuse interstitial and bilateral airspace disease is similar. Telemetry leads overlie the chest. IMPRESSION: 1. No substantial interval change. 2. Diffuse interstitial and bilateral airspace disease. Electronically Signed   By: Misty Stanley M.D.   On: 11/11/2019 06:33     Medications:       Assessment/ Plan:  69 y.o. female with a PMHx of diabetes mellitus type 2, hyperlipidemia, plantar fasciitis, B12 deficiency, chronic low back pain, restless leg syndrome, GERD, nonalcoholic fatty liver disease, anxiety, nephrolithiasis, irritable bowel syndrome, hypertension, chronic kidney disease stage IIIa, who was admitted to [facility] on 11/05/2019 for ongoing treatment of post COVID-19 recovery.  1.  Acute kidney injury/chronic kidney disease stage IIIa.    Dialysis held today as the patient was unstable earlier.  We will plan to resume dialysis on Monday.  2.  Acute respiratory failure secondary to COVID-19 infection.    Maintain the patient on ventilatory support at this time.  3.  Anemia of chronic kidney disease.    Hemoglobin currently 9.7.  Maintain the patient on Retacrit.  4.  Secondary hyperparathyroidism.  Phosphorus currently except low at 5.5.  Continue to monitor bone mineral metabolism parameters.   LOS: 0 Jolynn Bajorek 10/29/20212:49 PM

## 2019-11-11 NOTE — Progress Notes (Signed)
Pulmonary Critical Care Medicine [place]   PULMONARY CRITICAL CARE SERVICE  PROGRESS NOTE  Date of Service: 11/11/2019  Veronica Melton  BEM:[id number]  DOB: [birth year]   DOA: 11/05/2019  Referring Physician: Merton Border, MD  HPI: Veronica Melton is a 69 y.o. female seen for follow up of Acute on Chronic Respiratory Failure.  Patient currently is on assist control mode oxygen requirements were up to 100%.  She continues to do poorly  Medications: Reviewed on Rounds  Physical Exam:  Vitals: Temperature is 97.9 pulse 87 respiratory rate 32 blood pressure is 138/72 saturations 98%  Ventilator Settings on assist control FiO2 100% PEEP 8 tidal volume 420  . General: Comfortable at this time . Eyes: Grossly normal lids, irises & conjunctiva . ENT: grossly tongue is normal . Neck: no obvious mass . Cardiovascular: S1 S2 normal no gallop . Respiratory: No rhonchi very coarse breath sounds . Abdomen: soft . Skin: no rash seen on limited exam . Musculoskeletal: not rigid . Psychiatric:unable to assess . Neurologic: no seizure no involuntary movements         Lab Data:   Basic Metabolic Panel: Recent Labs  Lab 11/05/19 2052 11/07/19 0757 11/08/19 0518 11/09/19 0450 11/11/19 0437  NA 133* 134* 137 139 136  K 3.5 3.9 3.5 4.2 3.5  CL 95* 96* 97* 97* 93*  CO2 23 22 27 22 26   GLUCOSE 110* 146* 168* 185* 125*  BUN 99* 127* 60* 90* 92*  CREATININE 5.21* 5.87* 3.31* 3.99* 3.28*  CALCIUM 9.6 10.2 9.9 10.5* 10.3  MG 2.4 2.7* 2.2  --  2.3  PHOS 4.9* 7.6* 3.6 5.9* 5.5*    ABG: Recent Labs  Lab 11/05/19 1445 11/07/19 0845 11/08/19 0543 11/09/19 1402 11/11/19 0800  PHART 7.266* 7.220* 7.314* 7.373 7.351  PCO2ART 54.6* 57.6* 57.4* 48.9* 48.6*  PO2ART 104 115* 80.2* 65.6* 102  HCO3 24.0 22.8 28.5* 28.3* 26.3  O2SAT 97.2 97.7 95.8 94.4 97.7    Liver Function Tests: Recent Labs  Lab 11/05/19 2052 11/07/19 0757 11/08/19 0518 11/09/19 0450  11/11/19 0437  AST 19  --   --   --   --   ALT 14  --   --   --   --   ALKPHOS 92  --   --   --   --   BILITOT 1.0  --   --   --   --   PROT 5.3*  --   --   --   --   ALBUMIN 2.3* 2.6* 2.9* 3.0* 3.6   No results for input(s): LIPASE, AMYLASE in the last 168 hours. No results for input(s): AMMONIA in the last 168 hours.  CBC: Recent Labs  Lab 11/05/19 2052 11/07/19 0757 11/08/19 0518 11/09/19 0450 11/11/19 0437  WBC 10.5 9.9 11.2* 9.2 10.8*  NEUTROABS 7.1  --   --   --   --   HGB 9.2* 9.6* 10.2* 9.5* 9.7*  HCT 30.6* 31.7* 33.6* 32.1* 32.2*  MCV 88.2 87.3 87.0 88.2 89.4  PLT 269 227 278 288 379    Cardiac Enzymes: No results for input(s): CKTOTAL, CKMB, CKMBINDEX, TROPONINI in the last 168 hours.  BNP (last 3 results) No results for input(s): BNP in the last 8760 hours.  ProBNP (last 3 results) No results for input(s): PROBNP in the last 8760 hours.  Radiological Exams: DG CHEST PORT 1 VIEW  Result Date: 11/11/2019 CLINICAL DATA:  Pneumonia. EXAM: PORTABLE CHEST 1 VIEW COMPARISON:  11/09/2019 FINDINGS: 0550 hours. Tracheostomy tube remains in place. 2 right IJ central lines are stable. Low volumes with cardiomegaly. Diffuse interstitial and bilateral airspace disease is similar. Telemetry leads overlie the chest. IMPRESSION: 1. No substantial interval change. 2. Diffuse interstitial and bilateral airspace disease. Electronically Signed   By: Misty Stanley M.D.   On: 11/11/2019 06:33   DG CHEST PORT 1 VIEW  Result Date: 11/09/2019 CLINICAL DATA:  Pneumonia. EXAM: PORTABLE CHEST 1 VIEW COMPARISON:  November 08, 2019. FINDINGS: Stable cardiomediastinal silhouette. Tracheostomy tube is unchanged in position. Right internal jugular catheters are unchanged in position. No pneumothorax or pleural effusion is noted. Stable bilateral lung opacities are noted concerning for multifocal pneumonia. Bony thorax is unremarkable. IMPRESSION: Stable bilateral lung opacities are noted  concerning for multifocal pneumonia. Electronically Signed   By: Marijo Conception M.D.   On: 11/09/2019 13:44    Assessment/Plan Active Problems:   Acute on chronic respiratory failure with hypoxia ([facility])   End stage renal disease on dialysis ([place])   COVID-19 virus infection   Pneumonia due to COVID-19 virus   1. Acute on chronic respiratory failure with hypoxia we will continue with full support on the ventilator.  Patient continues to require 100% FiO2.  Chest x-ray not showing any major improvement 2. End-stage renal failure on hemodialysis we will continue to follow. 3. COVID-19 virus infection in recovery we will continue with supportive care. 4. Pneumonia due to COVID-19 slow improvement we will continue to follow along.   I have personally seen and evaluated the patient, evaluated laboratory and imaging results, formulated the assessment and plan and placed orders. The Patient requires high complexity decision making with multiple systems involvement.  Rounds were done with the Respiratory Therapy Director and Staff therapists and discussed with nursing staff also.  Allyne Gee, MD [facility] Pulmonary Critical Care Medicine Sleep Medicine

## 2019-11-12 ENCOUNTER — Other Ambulatory Visit (HOSPITAL_COMMUNITY): Payer: Medicare Other

## 2019-11-12 DIAGNOSIS — N186 End stage renal disease: Secondary | ICD-10-CM | POA: Diagnosis not present

## 2019-11-12 DIAGNOSIS — U071 COVID-19: Secondary | ICD-10-CM | POA: Diagnosis not present

## 2019-11-12 DIAGNOSIS — J9621 Acute and chronic respiratory failure with hypoxia: Secondary | ICD-10-CM | POA: Diagnosis not present

## 2019-11-12 DIAGNOSIS — Z9911 Dependence on respirator [ventilator] status: Secondary | ICD-10-CM | POA: Diagnosis not present

## 2019-11-12 LAB — BLOOD GAS, ARTERIAL
Acid-Base Excess: 2.1 mmol/L — ABNORMAL HIGH (ref 0.0–2.0)
Bicarbonate: 27.4 mmol/L (ref 20.0–28.0)
FIO2: 50
O2 Saturation: 98 %
Patient temperature: 37.7
pCO2 arterial: 54.2 mmHg — ABNORMAL HIGH (ref 32.0–48.0)
pH, Arterial: 7.328 — ABNORMAL LOW (ref 7.350–7.450)
pO2, Arterial: 117 mmHg — ABNORMAL HIGH (ref 83.0–108.0)

## 2019-11-12 LAB — TRIGLYCERIDES: Triglycerides: 810 mg/dL — ABNORMAL HIGH (ref <150)

## 2019-11-12 NOTE — Progress Notes (Signed)
Pulmonary Critical Care Medicine [place]   PULMONARY CRITICAL CARE SERVICE  PROGRESS NOTE  Date of Service: 11/12/2019  Veronica Melton  EVO:[id number]  DOB: 1950-01-18   DOA: 11/05/2019  Referring Physician: Merton Border, MD  HPI: Veronica Melton is a 69 y.o. female seen for follow up of Acute on Chronic Respiratory Failure.  Right now patient is on full support on assist control mode has been on 50% FiO2 with a PEEP of 8  Medications: Reviewed on Rounds  Physical Exam:  Vitals: Temperature is 97.2 pulse 77 respiratory 30 blood pressure is 102/50 saturations 100%  Ventilator Settings on assist control FiO2 50% tidal volume 460 PEEP 8  . General: Comfortable at this time . Eyes: Grossly normal lids, irises & conjunctiva . ENT: grossly tongue is normal . Neck: no obvious mass . Cardiovascular: S1 S2 normal no gallop . Respiratory: No rhonchi no rales are noted at this time . Abdomen: soft . Skin: no rash seen on limited exam . Musculoskeletal: not rigid . Psychiatric:unable to assess . Neurologic: no seizure no involuntary movements         Lab Data:   Basic Metabolic Panel: Recent Labs  Lab 11/05/19 2052 11/07/19 0757 11/08/19 0518 11/09/19 0450 11/11/19 0437  NA 133* 134* 137 139 136  K 3.5 3.9 3.5 4.2 3.5  CL 95* 96* 97* 97* 93*  CO2 23 22 27 22 26   GLUCOSE 110* 146* 168* 185* 125*  BUN 99* 127* 60* 90* 92*  CREATININE 5.21* 5.87* 3.31* 3.99* 3.28*  CALCIUM 9.6 10.2 9.9 10.5* 10.3  MG 2.4 2.7* 2.2  --  2.3  PHOS 4.9* 7.6* 3.6 5.9* 5.5*    ABG: Recent Labs  Lab 11/07/19 0845 11/08/19 0543 11/09/19 1402 11/11/19 0800 11/11/19 1522  PHART 7.220* 7.314* 7.373 7.351 7.330*  PCO2ART 57.6* 57.4* 48.9* 48.6* 51.8*  PO2ART 115* 80.2* 65.6* 102 190*  HCO3 22.8 28.5* 28.3* 26.3 26.7  O2SAT 97.7 95.8 94.4 97.7 99.1    Liver Function Tests: Recent Labs  Lab 11/05/19 2052 11/07/19 0757 11/08/19 0518 11/09/19 0450 11/11/19 0437   AST 19  --   --   --   --   ALT 14  --   --   --   --   ALKPHOS 92  --   --   --   --   BILITOT 1.0  --   --   --   --   PROT 5.3*  --   --   --   --   ALBUMIN 2.3* 2.6* 2.9* 3.0* 3.6   No results for input(s): LIPASE, AMYLASE in the last 168 hours. No results for input(s): AMMONIA in the last 168 hours.  CBC: Recent Labs  Lab 11/05/19 2052 11/07/19 0757 11/08/19 0518 11/09/19 0450 11/11/19 0437  WBC 10.5 9.9 11.2* 9.2 10.8*  NEUTROABS 7.1  --   --   --   --   HGB 9.2* 9.6* 10.2* 9.5* 9.7*  HCT 30.6* 31.7* 33.6* 32.1* 32.2*  MCV 88.2 87.3 87.0 88.2 89.4  PLT 269 227 278 288 379    Cardiac Enzymes: No results for input(s): CKTOTAL, CKMB, CKMBINDEX, TROPONINI in the last 168 hours.  BNP (last 3 results) No results for input(s): BNP in the last 8760 hours.  ProBNP (last 3 results) No results for input(s): PROBNP in the last 8760 hours.  Radiological Exams: DG CHEST PORT 1 VIEW  Result Date: 11/11/2019 CLINICAL DATA:  Pneumonia. EXAM: PORTABLE CHEST  1 VIEW COMPARISON:  11/09/2019 FINDINGS: 0550 hours. Tracheostomy tube remains in place. 2 right IJ central lines are stable. Low volumes with cardiomegaly. Diffuse interstitial and bilateral airspace disease is similar. Telemetry leads overlie the chest. IMPRESSION: 1. No substantial interval change. 2. Diffuse interstitial and bilateral airspace disease. Electronically Signed   By: Misty Stanley M.D.   On: 11/11/2019 06:33    Assessment/Plan Active Problems:   Acute on chronic respiratory failure with hypoxia (HCC)   End stage renal disease on dialysis ([place])   COVID-19 virus infection   Pneumonia due to COVID-19 virus   1. Acute on chronic respiratory failure hypoxia we will continue with full support on assist control titrate oxygen continue pulmonary toilet. 2. End-stage renal failure on hemodialysis we will continue to follow 3. COVID-19 virus infection in recovery 4. Pneumonia due to COVID-19 has been treated we  will continue to follow   I have personally seen and evaluated the patient, evaluated laboratory and imaging results, formulated the assessment and plan and placed orders. The Patient requires high complexity decision making with multiple systems involvement.  Rounds were done with the Respiratory Therapy Director and Staff therapists and discussed with nursing staff also.  Allyne Gee, MD [facility] Pulmonary Critical Care Medicine Sleep Medicine

## 2019-11-13 ENCOUNTER — Other Ambulatory Visit (HOSPITAL_COMMUNITY): Payer: Medicare Other

## 2019-11-13 DIAGNOSIS — N186 End stage renal disease: Secondary | ICD-10-CM | POA: Diagnosis not present

## 2019-11-13 DIAGNOSIS — J9621 Acute and chronic respiratory failure with hypoxia: Secondary | ICD-10-CM | POA: Diagnosis not present

## 2019-11-13 DIAGNOSIS — Z9911 Dependence on respirator [ventilator] status: Secondary | ICD-10-CM | POA: Diagnosis not present

## 2019-11-13 DIAGNOSIS — U071 COVID-19: Secondary | ICD-10-CM | POA: Diagnosis not present

## 2019-11-13 LAB — MAGNESIUM: Magnesium: 2.3 mg/dL (ref 1.7–2.4)

## 2019-11-13 LAB — BLOOD GAS, ARTERIAL
Acid-Base Excess: 2.9 mmol/L — ABNORMAL HIGH (ref 0.0–2.0)
Bicarbonate: 28.3 mmol/L — ABNORMAL HIGH (ref 20.0–28.0)
FIO2: 50
O2 Saturation: 98.2 %
Patient temperature: 37
pCO2 arterial: 55.6 mmHg — ABNORMAL HIGH (ref 32.0–48.0)
pH, Arterial: 7.327 — ABNORMAL LOW (ref 7.350–7.450)
pO2, Arterial: 126 mmHg — ABNORMAL HIGH (ref 83.0–108.0)

## 2019-11-13 LAB — CBC
HCT: 31.9 % — ABNORMAL LOW (ref 36.0–46.0)
Hemoglobin: 9.7 g/dL — ABNORMAL LOW (ref 12.0–15.0)
MCH: 26.9 pg (ref 26.0–34.0)
MCHC: 30.4 g/dL (ref 30.0–36.0)
MCV: 88.4 fL (ref 80.0–100.0)
Platelets: 293 10*3/uL (ref 150–400)
RBC: 3.61 MIL/uL — ABNORMAL LOW (ref 3.87–5.11)
RDW: 18.7 % — ABNORMAL HIGH (ref 11.5–15.5)
WBC: 5.6 10*3/uL (ref 4.0–10.5)
nRBC: 0 % (ref 0.0–0.2)

## 2019-11-13 LAB — RENAL FUNCTION PANEL
Albumin: 3.3 g/dL — ABNORMAL LOW (ref 3.5–5.0)
Anion gap: 19 — ABNORMAL HIGH (ref 5–15)
BUN: 129 mg/dL — ABNORMAL HIGH (ref 8–23)
CO2: 25 mmol/L (ref 22–32)
Calcium: 10.2 mg/dL (ref 8.9–10.3)
Chloride: 93 mmol/L — ABNORMAL LOW (ref 98–111)
Creatinine, Ser: 3.79 mg/dL — ABNORMAL HIGH (ref 0.44–1.00)
GFR, Estimated: 12 mL/min — ABNORMAL LOW (ref >60)
Glucose, Bld: 159 mg/dL — ABNORMAL HIGH (ref 70–99)
Phosphorus: 7.7 mg/dL — ABNORMAL HIGH (ref 2.5–4.6)
Potassium: 3.7 mmol/L (ref 3.5–5.1)
Sodium: 137 mmol/L (ref 135–145)

## 2019-11-13 NOTE — Progress Notes (Signed)
Pulmonary Critical Care Medicine [place]   PULMONARY CRITICAL CARE SERVICE  PROGRESS NOTE  Date of Service: 11/13/2019  Veronica Melton  BMW:[id number]  DOB: Jan 04, 1951   DOA: 11/05/2019  Referring Physician: Merton Border, MD  HPI: Veronica Melton is a 69 y.o. female seen for follow up of Acute on Chronic Respiratory Failure.  This morning I found the patient on SIMV with a rate of 20 she was a on a pressure support of 10 had been switched over by the respiratory therapist.  I discussed the situation with the respiratory therapist and switch the patient over to pressure support she actually appeared to be doing better on the pressure support we increased her flow rate also it had been set at 50 and it was quite obvious the patient was air hungry.  Increase the flow rate to 65 and this actually settled her down.  Her volume is also improved and her respiratory rate was stabilized  Medications: Reviewed on Rounds  Physical Exam:  Vitals: Temperature 97.5 pulse 52 respiratory rate was 34 blood pressure is 128/59 saturations 97%  Ventilator Settings new settings pressure support FiO2 45% pressure 18/8 the volumes were 470 cc  . General: Comfortable at this time . Eyes: Grossly normal lids, irises & conjunctiva . ENT: grossly tongue is normal . Neck: no obvious mass . Cardiovascular: S1 S2 normal no gallop . Respiratory: No rhonchi no rales are noted at this time . Abdomen: soft . Skin: no rash seen on limited exam . Musculoskeletal: not rigid . Psychiatric:unable to assess . Neurologic: no seizure no involuntary movements         Lab Data:   Basic Metabolic Panel: Recent Labs  Lab 11/07/19 0757 11/08/19 0518 11/09/19 0450 11/11/19 0437 11/13/19 0426  NA 134* 137 139 136 137  K 3.9 3.5 4.2 3.5 3.7  CL 96* 97* 97* 93* 93*  CO2 22 27 22 26 25   GLUCOSE 146* 168* 185* 125* 159*  BUN 127* 60* 90* 92* 129*  CREATININE 5.87* 3.31* 3.99* 3.28* 3.79*  CALCIUM  10.2 9.9 10.5* 10.3 10.2  MG 2.7* 2.2  --  2.3 2.3  PHOS 7.6* 3.6 5.9* 5.5* 7.7*    ABG: Recent Labs  Lab 11/09/19 1402 11/11/19 0800 11/11/19 1522 11/12/19 2135 11/13/19 0010  PHART 7.373 7.351 7.330* 7.328* 7.327*  PCO2ART 48.9* 48.6* 51.8* 54.2* 55.6*  PO2ART 65.6* 102 190* 117* 126*  HCO3 28.3* 26.3 26.7 27.4 28.3*  O2SAT 94.4 97.7 99.1 98.0 98.2    Liver Function Tests: Recent Labs  Lab 11/07/19 0757 11/08/19 0518 11/09/19 0450 11/11/19 0437 11/13/19 0426  ALBUMIN 2.6* 2.9* 3.0* 3.6 3.3*   No results for input(s): LIPASE, AMYLASE in the last 168 hours. No results for input(s): AMMONIA in the last 168 hours.  CBC: Recent Labs  Lab 11/07/19 0757 11/08/19 0518 11/09/19 0450 11/11/19 0437 11/13/19 0426  WBC 9.9 11.2* 9.2 10.8* 5.6  HGB 9.6* 10.2* 9.5* 9.7* 9.7*  HCT 31.7* 33.6* 32.1* 32.2* 31.9*  MCV 87.3 87.0 88.2 89.4 88.4  PLT 227 278 288 379 293    Cardiac Enzymes: No results for input(s): CKTOTAL, CKMB, CKMBINDEX, TROPONINI in the last 168 hours.  BNP (last 3 results) No results for input(s): BNP in the last 8760 hours.  ProBNP (last 3 results) No results for input(s): PROBNP in the last 8760 hours.  Radiological Exams: DG CHEST PORT 1 VIEW  Result Date: 11/13/2019 CLINICAL DATA:  Follow-up for respiratory distress and multifocal  pneumonia. EXAM: PORTABLE CHEST 1 VIEW COMPARISON:  11/12/2019 and earlier studies. FINDINGS: No convincing change in the bilateral interstitial and airspace lung opacities, when allowing for differences in radiographic technique and patient positioning. No new lung abnormalities. No convincing pleural effusion and no pneumothorax. Tracheostomy tube, dual-lumen tunneled right internal jugular central venous catheter and smaller caliber right internal jugular central venous catheter are all stable. IMPRESSION: 1. No change from the prior study. 2. Persistent bilateral interstitial and airspace lung opacities consistent with  multifocal pneumonia. 3. Stable support apparatus. Electronically Signed   By: Lajean Manes M.D.   On: 11/13/2019 07:51   DG CHEST PORT 1 VIEW  Result Date: 11/12/2019 CLINICAL DATA:  69 year old female with a history of pneumonia EXAM: PORTABLE CHEST 1 VIEW COMPARISON:  11/11/2019 FINDINGS: Cardiomediastinal silhouette likely unchanged, obscured by lung and pleural disease. Worsening mixed interstitial and airspace opacities throughout the bilateral lungs. Unchanged tracheostomy tube. Unchanged right IJ central venous catheter and right IJ hemodialysis catheter. Low lung volumes persist. IMPRESSION: Worsening mixed interstitial and airspace opacities of the bilateral lungs, potentially multifocal pneumonia and/or edema. Unchanged right IJ central venous catheter, right IJ hemodialysis catheter, and the tracheostomy Electronically Signed   By: Corrie Mckusick D.O.   On: 11/12/2019 13:06    Assessment/Plan Active Problems:   Acute on chronic respiratory failure with hypoxia (HCC)   End stage renal disease on dialysis ([place])   COVID-19 virus infection   Pneumonia due to COVID-19 virus   1. Acute on chronic respiratory failure with hypoxia we will continue with pressure support mode right now if the patient has any issues I would switch her over to pressure control and maintain her ventilatory demand by adjusting the inspiratory times. 2. End-stage renal failure on hemodialysis we will continue with supportive care. 3. COVID-19 virus infection in recovery we will continue to follow 4. Pneumonia due to COVID-19 treated   I have personally seen and evaluated the patient, evaluated laboratory and imaging results, formulated the assessment and plan and placed orders. The Patient requires high complexity decision making with multiple systems involvement.  Rounds were done with the Respiratory Therapy Director and Staff therapists and discussed with nursing staff also.  Allyne Gee, MD  [facility] Pulmonary Critical Care Medicine Sleep Medicine

## 2019-11-14 ENCOUNTER — Other Ambulatory Visit (HOSPITAL_COMMUNITY): Payer: Medicare Other

## 2019-11-14 DIAGNOSIS — U071 COVID-19: Secondary | ICD-10-CM | POA: Diagnosis not present

## 2019-11-14 DIAGNOSIS — J9621 Acute and chronic respiratory failure with hypoxia: Secondary | ICD-10-CM | POA: Diagnosis not present

## 2019-11-14 DIAGNOSIS — N186 End stage renal disease: Secondary | ICD-10-CM | POA: Diagnosis not present

## 2019-11-14 DIAGNOSIS — Z9911 Dependence on respirator [ventilator] status: Secondary | ICD-10-CM | POA: Diagnosis not present

## 2019-11-14 LAB — BLOOD CULTURE ID PANEL (REFLEXED) - BCID2

## 2019-11-14 LAB — RENAL FUNCTION PANEL
Albumin: 3.3 g/dL — ABNORMAL LOW (ref 3.5–5.0)
Anion gap: 17 — ABNORMAL HIGH (ref 5–15)
BUN: 139 mg/dL — ABNORMAL HIGH (ref 8–23)
CO2: 27 mmol/L (ref 22–32)
Calcium: 10.3 mg/dL (ref 8.9–10.3)
Chloride: 96 mmol/L — ABNORMAL LOW (ref 98–111)
Creatinine, Ser: 3.55 mg/dL — ABNORMAL HIGH (ref 0.44–1.00)
GFR, Estimated: 13 mL/min — ABNORMAL LOW (ref >60)
Glucose, Bld: 185 mg/dL — ABNORMAL HIGH (ref 70–99)
Phosphorus: 7.8 mg/dL — ABNORMAL HIGH (ref 2.5–4.6)
Potassium: 3.5 mmol/L (ref 3.5–5.1)
Sodium: 140 mmol/L (ref 135–145)

## 2019-11-14 LAB — CBC
HCT: 31.9 % — ABNORMAL LOW (ref 36.0–46.0)
Hemoglobin: 9.6 g/dL — ABNORMAL LOW (ref 12.0–15.0)
MCH: 27 pg (ref 26.0–34.0)
MCHC: 30.1 g/dL (ref 30.0–36.0)
MCV: 89.6 fL (ref 80.0–100.0)
Platelets: 250 10*3/uL (ref 150–400)
RBC: 3.56 MIL/uL — ABNORMAL LOW (ref 3.87–5.11)
RDW: 18.6 % — ABNORMAL HIGH (ref 11.5–15.5)
WBC: 7.2 10*3/uL (ref 4.0–10.5)
nRBC: 0 % (ref 0.0–0.2)

## 2019-11-14 LAB — MAGNESIUM: Magnesium: 2.3 mg/dL (ref 1.7–2.4)

## 2019-11-14 NOTE — Progress Notes (Signed)
Pulmonary Critical Care Medicine [place]   PULMONARY CRITICAL CARE SERVICE  PROGRESS NOTE  Date of Service: 11/14/2019  Veronica Melton  ZOX:[id number]  DOB: [birth year]   DOA: 11/05/2019  Referring Physician: Merton Border, MD  HPI: Veronica Melton is a 69 y.o. female seen for follow up of Acute on Chronic Respiratory Failure.  Patient currently is on pressure support has been on an FiO2 of 40% currently is on pressure of 18/5  Medications: Reviewed on Rounds  Physical Exam:  Vitals: Temperature 99.2 pulse 95 respiratory rate 34 blood pressure is 131/61 saturations 97%  Ventilator Settings on pressure support FiO2 is 40% pressure of 18/5  . General: Comfortable at this time . Eyes: Grossly normal lids, irises & conjunctiva . ENT: grossly tongue is normal . Neck: no obvious mass . Cardiovascular: S1 S2 normal no gallop . Respiratory: Scattered rhonchi very coarse breath sounds . Abdomen: soft . Skin: no rash seen on limited exam . Musculoskeletal: not rigid . Psychiatric:unable to assess . Neurologic: no seizure no involuntary movements         Lab Data:   Basic Metabolic Panel: Recent Labs  Lab 11/08/19 0518 11/09/19 0450 11/11/19 0437 11/13/19 0426 11/14/19 0551  NA 137 139 136 137 140  K 3.5 4.2 3.5 3.7 3.5  CL 97* 97* 93* 93* 96*  CO2 27 22 26 25 27   GLUCOSE 168* 185* 125* 159* 185*  BUN 60* 90* 92* 129* 139*  CREATININE 3.31* 3.99* 3.28* 3.79* 3.55*  CALCIUM 9.9 10.5* 10.3 10.2 10.3  MG 2.2  --  2.3 2.3 2.3  PHOS 3.6 5.9* 5.5* 7.7* 7.8*    ABG: Recent Labs  Lab 11/09/19 1402 11/11/19 0800 11/11/19 1522 11/12/19 2135 11/13/19 0010  PHART 7.373 7.351 7.330* 7.328* 7.327*  PCO2ART 48.9* 48.6* 51.8* 54.2* 55.6*  PO2ART 65.6* 102 190* 117* 126*  HCO3 28.3* 26.3 26.7 27.4 28.3*  O2SAT 94.4 97.7 99.1 98.0 98.2    Liver Function Tests: Recent Labs  Lab 11/08/19 0518 11/09/19 0450 11/11/19 0437 11/13/19 0426 11/14/19 0551   ALBUMIN 2.9* 3.0* 3.6 3.3* 3.3*   No results for input(s): LIPASE, AMYLASE in the last 168 hours. No results for input(s): AMMONIA in the last 168 hours.  CBC: Recent Labs  Lab 11/08/19 0518 11/09/19 0450 11/11/19 0437 11/13/19 0426 11/14/19 0551  WBC 11.2* 9.2 10.8* 5.6 7.2  HGB 10.2* 9.5* 9.7* 9.7* 9.6*  HCT 33.6* 32.1* 32.2* 31.9* 31.9*  MCV 87.0 88.2 89.4 88.4 89.6  PLT 278 288 379 293 250    Cardiac Enzymes: No results for input(s): CKTOTAL, CKMB, CKMBINDEX, TROPONINI in the last 168 hours.  BNP (last 3 results) No results for input(s): BNP in the last 8760 hours.  ProBNP (last 3 results) No results for input(s): PROBNP in the last 8760 hours.  Radiological Exams: DG CHEST PORT 1 VIEW  Result Date: 11/14/2019 CLINICAL DATA:  Evaluate tracheostomy tube placement. Respiratory failure EXAM: PORTABLE CHEST 1 VIEW COMPARISON:  11/13/2019 . FINDINGS: Tracheostomy tube tip is approximately 3.9 cm above the carina. Not significantly changed from previous exam. Small bore right IJ catheter tip is in the right atrium. Dual lumen catheter is identified with tip at the cavoatrial junction. Stable cardiomediastinal contours. No change in diffuse bilateral interstitial and airspace densities. IMPRESSION: No change in aeration to the lungs compared with previous exam. Electronically Signed   By: Kerby Moors M.D.   On: 11/14/2019 06:37   DG CHEST PORT 1 VIEW  Result Date: 11/13/2019 CLINICAL DATA:  Follow-up for respiratory distress and multifocal pneumonia. EXAM: PORTABLE CHEST 1 VIEW COMPARISON:  11/12/2019 and earlier studies. FINDINGS: No convincing change in the bilateral interstitial and airspace lung opacities, when allowing for differences in radiographic technique and patient positioning. No new lung abnormalities. No convincing pleural effusion and no pneumothorax. Tracheostomy tube, dual-lumen tunneled right internal jugular central venous catheter and smaller caliber right  internal jugular central venous catheter are all stable. IMPRESSION: 1. No change from the prior study. 2. Persistent bilateral interstitial and airspace lung opacities consistent with multifocal pneumonia. 3. Stable support apparatus. Electronically Signed   By: Lajean Manes M.D.   On: 11/13/2019 07:51   DG CHEST PORT 1 VIEW  Result Date: 11/12/2019 CLINICAL DATA:  69 year old female with a history of pneumonia EXAM: PORTABLE CHEST 1 VIEW COMPARISON:  11/11/2019 FINDINGS: Cardiomediastinal silhouette likely unchanged, obscured by lung and pleural disease. Worsening mixed interstitial and airspace opacities throughout the bilateral lungs. Unchanged tracheostomy tube. Unchanged right IJ central venous catheter and right IJ hemodialysis catheter. Low lung volumes persist. IMPRESSION: Worsening mixed interstitial and airspace opacities of the bilateral lungs, potentially multifocal pneumonia and/or edema. Unchanged right IJ central venous catheter, right IJ hemodialysis catheter, and the tracheostomy Electronically Signed   By: Corrie Mckusick D.O.   On: 11/12/2019 13:06    Assessment/Plan Active Problems:   Acute on chronic respiratory failure with hypoxia (HCC)   End stage renal disease on dialysis ([place])   COVID-19 virus infection   Pneumonia due to COVID-19 virus   1. Acute on chronic respiratory failure with hypoxia we will continue with current vent settings.  She appears to be much more comfortable in the present settings spoke with respiratory therapy to continue to monitor closely she is not air-trapping at this time 2. End-stage renal failure on hemodialysis continue with supportive care 3. COVID-19 virus infection in recovery 4. Pneumonia due to COVID-19 slow improvement we will continue to follow along.   I have personally seen and evaluated the patient, evaluated laboratory and imaging results, formulated the assessment and plan and placed orders. The Patient requires high complexity  decision making with multiple systems involvement.  Rounds were done with the Respiratory Therapy Director and Staff therapists and discussed with nursing staff also.  Allyne Gee, MD [facility] Pulmonary Critical Care Medicine Sleep Medicine

## 2019-11-14 NOTE — Progress Notes (Signed)
[facility]  ROUNDING NOTE   Subjective:  Patient remains critically ill. Still on the ventilator. Urine output however did improve over the weekend and was 2.8 L over the preceding 24 hours.    Objective:  Vital signs in last 24 hours:  Temperature nine 9.2 pulse 95 respiration 34 blood pressure 131/61  Physical Exam: General:  Critically ill-appearing  Head:  Normocephalic, atraumatic. Moist oral mucosal membranes  Eyes:  Anicteric  Neck:  Tracheostomy in place  Lungs:   Coarse breath sounds bilateral, vent assisted  Heart:  S1S2 no rubs  Abdomen:   Soft, nontender, bowel sounds present  Extremities:  1+ peripheral edema.  Neurologic:  Awake, not following commands  Skin:  No acute rashes  Access:  Temporary right IJ dialysis catheter    Basic Metabolic Panel: Recent Labs  Lab 11/07/19 0757 11/07/19 0757 11/08/19 0518 11/08/19 0518 11/09/19 0450 11/09/19 0450 11/11/19 0437 11/13/19 0426 11/14/19 0551  NA 134*   < > 137  --  139  --  136 137 140  K 3.9   < > 3.5  --  4.2  --  3.5 3.7 3.5  CL 96*   < > 97*  --  97*  --  93* 93* 96*  CO2 22   < > 27  --  22  --  26 25 27   GLUCOSE 146*   < > 168*  --  185*  --  125* 159* 185*  BUN 127*   < > 60*  --  90*  --  92* 129* 139*  CREATININE 5.87*   < > 3.31*  --  3.99*  --  3.28* 3.79* 3.55*  CALCIUM 10.2   < > 9.9   < > 10.5*   < > 10.3 10.2 10.3  MG 2.7*  --  2.2  --   --   --  2.3 2.3 2.3  PHOS 7.6*   < > 3.6  --  5.9*  --  5.5* 7.7* 7.8*   < > = values in this interval not displayed.    Liver Function Tests: Recent Labs  Lab 11/08/19 0518 11/09/19 0450 11/11/19 0437 11/13/19 0426 11/14/19 0551  ALBUMIN 2.9* 3.0* 3.6 3.3* 3.3*   No results for input(s): LIPASE, AMYLASE in the last 168 hours. No results for input(s): AMMONIA in the last 168 hours.  CBC: Recent Labs  Lab 11/08/19 0518 11/09/19 0450 11/11/19 0437 11/13/19 0426 11/14/19 0551  WBC 11.2* 9.2 10.8* 5.6 7.2  HGB 10.2* 9.5*  9.7* 9.7* 9.6*  HCT 33.6* 32.1* 32.2* 31.9* 31.9*  MCV 87.0 88.2 89.4 88.4 89.6  PLT 278 288 379 293 250    Cardiac Enzymes: No results for input(s): CKTOTAL, CKMB, CKMBINDEX, TROPONINI in the last 168 hours.  BNP: Invalid input(s): POCBNP  CBG: No results for input(s): GLUCAP in the last 168 hours.  Microbiology: Results for orders placed or performed during the hospital encounter of 11/05/19  Culture, respiratory (non-expectorated)     Status: None   Collection Time: 11/06/19  1:46 PM   Specimen: Tracheal Aspirate; Respiratory  Result Value Ref Range Status   Specimen Description TRACHEAL ASPIRATE  Final   Special Requests NONE  Final   Gram Stain   Final    RARE WBC PRESENT, PREDOMINANTLY PMN NO ORGANISMS SEEN    Culture   Final    RARE Normal respiratory flora-no Staph aureus or Pseudomonas seen Performed at [facility] [address],  [place], [place] [address]    Report Status 11/08/2019 FINAL  Final  Urine Culture     Status: Abnormal   Collection Time: 11/10/19  1:36 PM   Specimen: Urine, Random  Result Value Ref Range Status   Specimen Description URINE, RANDOM  Final   Special Requests   Final    NONE Performed at [facility], [place] [address] [place], [place] [address]    Culture 60,000 COLONIES/mL YEAST (A)  Final   Report Status 11/11/2019 FINAL  Final  Culture, blood (routine x 2)     Status: None (Preliminary result)   Collection Time: 11/12/19 10:00 PM   Specimen: BLOOD  Result Value Ref Range Status   Specimen Description BLOOD SITE NOT SPECIFIED  Final   Special Requests   Final    BOTTLES DRAWN AEROBIC AND ANAEROBIC Blood Culture results may not be optimal due to an excessive volume of blood received in culture bottles   Culture   Final    NO GROWTH < 12 HOURS Performed at [facility], [place] [address] [place], [place] [address]    Report Status PENDING  Incomplete  Culture, blood (routine x 2)     Status: None (Preliminary  result)   Collection Time: 11/12/19 10:06 PM   Specimen: BLOOD  Result Value Ref Range Status   Specimen Description BLOOD SITE NOT SPECIFIED  Final   Special Requests   Final    BOTTLES DRAWN AEROBIC AND ANAEROBIC Blood Culture results may not be optimal due to an excessive volume of blood received in culture bottles   Culture  Setup Time   Final    GRAM POSITIVE COCCI ANAEROBIC BOTTLE ONLY Organism ID to follow CRITICAL RESULT CALLED TO, READ BACK BY AND VERIFIED WITH: RN J ALGHALI @0058  10/14/19 BY S GEZAHEGN  Performed at [facility], [place] [address], [place], [place] [address]    Culture PENDING  Incomplete   Report Status PENDING  Incomplete  Blood Culture ID Panel (Reflexed)     Status: Abnormal   Collection Time: 11/12/19 10:06 PM  Result Value Ref Range Status   Enterococcus faecalis NOT DETECTED NOT DETECTED Final   Enterococcus Faecium NOT DETECTED NOT DETECTED Final   Listeria monocytogenes NOT DETECTED NOT DETECTED Final   Staphylococcus species DETECTED (A) NOT DETECTED Final    Comment: CRITICAL RESULT CALLED TO, READ BACK BY AND VERIFIED WITH: RN J ALGHALI @0058  10/14/19 BY S GEZAHEGN     Staphylococcus aureus (BCID) NOT DETECTED NOT DETECTED Final   Staphylococcus epidermidis DETECTED (A) NOT DETECTED Final    Comment: Methicillin (oxacillin) resistant coagulase negative staphylococcus. Possible blood culture contaminant (unless isolated from more than one blood culture draw or clinical case suggests pathogenicity). No antibiotic treatment is indicated for blood  culture contaminants. CRITICAL RESULT CALLED TO, READ BACK BY AND VERIFIED WITH: RN J ALGHALI @0058  10/14/19 BY S GEZAHEGN     Staphylococcus lugdunensis NOT DETECTED NOT DETECTED Final   Streptococcus species NOT DETECTED NOT DETECTED Final   Streptococcus agalactiae NOT DETECTED NOT DETECTED Final   Streptococcus pneumoniae NOT DETECTED NOT DETECTED Final   Streptococcus pyogenes NOT DETECTED NOT  DETECTED Final   A.calcoaceticus-baumannii NOT DETECTED NOT DETECTED Final   Bacteroides fragilis NOT DETECTED NOT DETECTED Final   Enterobacterales NOT DETECTED NOT DETECTED Final   Enterobacter cloacae complex NOT DETECTED NOT DETECTED Final   Escherichia coli NOT DETECTED NOT DETECTED Final   Klebsiella aerogenes NOT DETECTED  NOT DETECTED Final   Klebsiella oxytoca NOT DETECTED NOT DETECTED Final   Klebsiella pneumoniae NOT DETECTED NOT DETECTED Final   Proteus species NOT DETECTED NOT DETECTED Final   Salmonella species NOT DETECTED NOT DETECTED Final   Serratia marcescens NOT DETECTED NOT DETECTED Final   Haemophilus influenzae NOT DETECTED NOT DETECTED Final   Neisseria meningitidis NOT DETECTED NOT DETECTED Final   Pseudomonas aeruginosa NOT DETECTED NOT DETECTED Final   Stenotrophomonas maltophilia NOT DETECTED NOT DETECTED Final   Candida albicans NOT DETECTED NOT DETECTED Final   Candida auris NOT DETECTED NOT DETECTED Final   Candida glabrata NOT DETECTED NOT DETECTED Final   Candida krusei NOT DETECTED NOT DETECTED Final   Candida parapsilosis NOT DETECTED NOT DETECTED Final   Candida tropicalis NOT DETECTED NOT DETECTED Final   Cryptococcus neoformans/gattii NOT DETECTED NOT DETECTED Final   Methicillin resistance mecA/C DETECTED (A) NOT DETECTED Final    Comment: CRITICAL RESULT CALLED TO, READ BACK BY AND VERIFIED WITH: RN J ALGHALI @0058  10/14/19 BY S GEZAHEGN  Performed at [facility], [address] [place], [place] [address]   Culture, respiratory (non-expectorated)     Status: None (Preliminary result)   Collection Time: 11/12/19 10:23 PM   Specimen: Tracheal Aspirate; Respiratory  Result Value Ref Range Status   Specimen Description TRACHEAL ASPIRATE  Final   Special Requests NONE  Final   Gram Stain   Final    RARE WBC PRESENT,BOTH PMN AND MONONUCLEAR NO ORGANISMS SEEN    Culture   Final    NO GROWTH < 12 HOURS Performed at [facility],  [place] [address], [place], [place] [address]    Report Status PENDING  Incomplete    Coagulation Studies: No results for input(s): LABPROT, INR in the last 72 hours.  Urinalysis: No results for input(s): COLORURINE, LABSPEC, PHURINE, GLUCOSEU, HGBUR, BILIRUBINUR, KETONESUR, PROTEINUR, UROBILINOGEN, NITRITE, LEUKOCYTESUR in the last 72 hours.  Invalid input(s): APPERANCEUR    Imaging: DG CHEST PORT 1 VIEW  Result Date: 11/14/2019 CLINICAL DATA:  Evaluate tracheostomy tube placement. Respiratory failure EXAM: PORTABLE CHEST 1 VIEW COMPARISON:  11/13/2019 . FINDINGS: Tracheostomy tube tip is approximately 3.9 cm above the carina. Not significantly changed from previous exam. Small bore right IJ catheter tip is in the right atrium. Dual lumen catheter is identified with tip at the cavoatrial junction. Stable cardiomediastinal contours. No change in diffuse bilateral interstitial and airspace densities. IMPRESSION: No change in aeration to the lungs compared with previous exam. Electronically Signed   By: Kerby Moors M.D.   On: 11/14/2019 06:37   DG CHEST PORT 1 VIEW  Result Date: 11/13/2019 CLINICAL DATA:  Follow-up for respiratory distress and multifocal pneumonia. EXAM: PORTABLE CHEST 1 VIEW COMPARISON:  11/12/2019 and earlier studies. FINDINGS: No convincing change in the bilateral interstitial and airspace lung opacities, when allowing for differences in radiographic technique and patient positioning. No new lung abnormalities. No convincing pleural effusion and no pneumothorax. Tracheostomy tube, dual-lumen tunneled right internal jugular central venous catheter and smaller caliber right internal jugular central venous catheter are all stable. IMPRESSION: 1. No change from the prior study. 2. Persistent bilateral interstitial and airspace lung opacities consistent with multifocal pneumonia. 3. Stable support apparatus. Electronically Signed   By: Lajean Manes M.D.   On: 11/13/2019 07:51   DG  CHEST PORT 1 VIEW  Result Date: 11/12/2019 CLINICAL DATA:  69 year old female with a history of pneumonia EXAM: PORTABLE CHEST 1 VIEW COMPARISON:  11/11/2019 FINDINGS: Cardiomediastinal silhouette likely unchanged, obscured by lung and pleural disease. Worsening mixed interstitial and airspace opacities throughout the bilateral lungs. Unchanged tracheostomy tube. Unchanged right IJ central venous catheter and right IJ hemodialysis catheter. Low lung volumes persist. IMPRESSION: Worsening mixed interstitial and airspace opacities of the bilateral lungs, potentially multifocal pneumonia and/or edema. Unchanged right IJ central venous catheter, right IJ hemodialysis catheter, and the tracheostomy Electronically Signed   By: Corrie Mckusick D.O.   On: 11/12/2019 13:06     Medications:       Assessment/ Plan:  69 y.o. female with a PMHx of diabetes mellitus type 2, hyperlipidemia, plantar fasciitis, B12 deficiency, chronic low back pain, restless leg syndrome, GERD, nonalcoholic fatty liver disease, anxiety, nephrolithiasis, irritable bowel syndrome, hypertension, chronic kidney disease stage IIIa, who was admitted to [facility] on 11/05/2019 for ongoing treatment of post COVID-19 recovery.  1.  Acute kidney injury/chronic kidney disease stage IIIa.    Urine output has improved.  Urine output was 2.8 L over the preceding 24 hours.  Therefore hold further dialysis for now.  May need to restart if azotemia continues to worsen however.  2.  Acute respiratory failure secondary to COVID-19 infection.    Patient still on the ventilator.  Weaning from the ventilator as per pulmonary/critical care.  3.  Anemia of chronic kidney disease.    Hemoglobin relatively stable at 9.6.  Maintain the patient on current dosage of Retacrit.  4.  Secondary hyperparathyroidism.  Phosphorus remains high at 7.8.  Continue to periodically monitor.   LOS: 0 Remberto Lienhard 11/1/20217:56 AM

## 2019-11-15 DIAGNOSIS — Z9911 Dependence on respirator [ventilator] status: Secondary | ICD-10-CM | POA: Diagnosis not present

## 2019-11-15 DIAGNOSIS — N186 End stage renal disease: Secondary | ICD-10-CM | POA: Diagnosis not present

## 2019-11-15 DIAGNOSIS — U071 COVID-19: Secondary | ICD-10-CM | POA: Diagnosis not present

## 2019-11-15 DIAGNOSIS — J9621 Acute and chronic respiratory failure with hypoxia: Secondary | ICD-10-CM | POA: Diagnosis not present

## 2019-11-15 LAB — CULTURE, RESPIRATORY W GRAM STAIN

## 2019-11-15 LAB — TRIGLYCERIDES: Triglycerides: 414 mg/dL — ABNORMAL HIGH (ref <150)

## 2019-11-15 NOTE — Progress Notes (Addendum)
Pulmonary Critical Care Medicine [place]   PULMONARY CRITICAL CARE SERVICE  PROGRESS NOTE  Date of Service: 11/15/2019  Veronica Melton  WNU:[id number]  DOB: 20-Feb-1950   DOA: 11/05/2019  Referring Physician: Merton Border, MD  HPI: Veronica Melton is a 69 y.o. female seen for follow up of Acute on Chronic Respiratory Failure.  This morning on rounds she was on pressure support still on 40% FiO2 respiratory rate was noted to be elevated spoke with team on rounds today change her over to full support  Medications: Reviewed on Rounds  Physical Exam:  Vitals: Temperature is 102 pulse 108 respiratory rate 45 blood pressure is 123/53 saturations 94%  Ventilator Settings on pressure support FiO2 40% tidal volume 462 pressure 18/8  . General: Comfortable at this time . Eyes: Grossly normal lids, irises & conjunctiva . ENT: grossly tongue is normal . Neck: no obvious mass . Cardiovascular: S1 S2 normal no gallop . Respiratory: No rhonchi very coarse breath sounds . Abdomen: soft . Skin: no rash seen on limited exam . Musculoskeletal: not rigid . Psychiatric:unable to assess . Neurologic: no seizure no involuntary movements         Lab Data:   Basic Metabolic Panel: Recent Labs  Lab 11/09/19 0450 11/11/19 0437 11/13/19 0426 11/14/19 0551  NA 139 136 137 140  K 4.2 3.5 3.7 3.5  CL 97* 93* 93* 96*  CO2 22 26 25 27   GLUCOSE 185* 125* 159* 185*  BUN 90* 92* 129* 139*  CREATININE 3.99* 3.28* 3.79* 3.55*  CALCIUM 10.5* 10.3 10.2 10.3  MG  --  2.3 2.3 2.3  PHOS 5.9* 5.5* 7.7* 7.8*    ABG: Recent Labs  Lab 11/09/19 1402 11/11/19 0800 11/11/19 1522 11/12/19 2135 11/13/19 0010  PHART 7.373 7.351 7.330* 7.328* 7.327*  PCO2ART 48.9* 48.6* 51.8* 54.2* 55.6*  PO2ART 65.6* 102 190* 117* 126*  HCO3 28.3* 26.3 26.7 27.4 28.3*  O2SAT 94.4 97.7 99.1 98.0 98.2    Liver Function Tests: Recent Labs  Lab 11/09/19 0450 11/11/19 0437 11/13/19 0426  11/14/19 0551  ALBUMIN 3.0* 3.6 3.3* 3.3*   No results for input(s): LIPASE, AMYLASE in the last 168 hours. No results for input(s): AMMONIA in the last 168 hours.  CBC: Recent Labs  Lab 11/09/19 0450 11/11/19 0437 11/13/19 0426 11/14/19 0551  WBC 9.2 10.8* 5.6 7.2  HGB 9.5* 9.7* 9.7* 9.6*  HCT 32.1* 32.2* 31.9* 31.9*  MCV 88.2 89.4 88.4 89.6  PLT 288 379 293 250    Cardiac Enzymes: No results for input(s): CKTOTAL, CKMB, CKMBINDEX, TROPONINI in the last 168 hours.  BNP (last 3 results) No results for input(s): BNP in the last 8760 hours.  ProBNP (last 3 results) No results for input(s): PROBNP in the last 8760 hours.  Radiological Exams: DG CHEST PORT 1 VIEW  Result Date: 11/14/2019 CLINICAL DATA:  Evaluate tracheostomy tube placement. Respiratory failure EXAM: PORTABLE CHEST 1 VIEW COMPARISON:  11/13/2019 . FINDINGS: Tracheostomy tube tip is approximately 3.9 cm above the carina. Not significantly changed from previous exam. Small bore right IJ catheter tip is in the right atrium. Dual lumen catheter is identified with tip at the cavoatrial junction. Stable cardiomediastinal contours. No change in diffuse bilateral interstitial and airspace densities. IMPRESSION: No change in aeration to the lungs compared with previous exam. Electronically Signed   By: Kerby Moors M.D.   On: 11/14/2019 06:37    Assessment/Plan Active Problems:   Acute on chronic respiratory failure with hypoxia (  [place])   End stage renal disease on dialysis ([place])   COVID-19 virus infection   Pneumonia due to COVID-19 virus   1. Acute on chronic respiratory failure hypoxia we Melton continue with full vent support now has fever no weaning we Melton switch over to volume cycle if able to tolerate otherwise pressure control 2. End-stage renal failure on dialysis being seen by nephrology Melton continue to follow 3. COVID-19 virus infection in resolution severe pulmonary damage 4. Pneumonia due to COVID-19  diffuse changes still noted on the chest films.   I have personally seen and evaluated the patient, evaluated laboratory and imaging results, formulated the assessment and plan and placed orders. The Patient requires high complexity decision making with multiple systems involvement.  Rounds were done with the Respiratory Therapy Director and Staff therapists and discussed with nursing staff also.  Allyne Gee, MD [facility] Pulmonary Critical Care Medicine Sleep Medicine

## 2019-11-16 ENCOUNTER — Other Ambulatory Visit (HOSPITAL_COMMUNITY): Payer: Medicare Other

## 2019-11-16 LAB — RENAL FUNCTION PANEL
Albumin: 3.1 g/dL — ABNORMAL LOW (ref 3.5–5.0)
Anion gap: 17 — ABNORMAL HIGH (ref 5–15)
BUN: 176 mg/dL — ABNORMAL HIGH (ref 8–23)
CO2: 28 mmol/L (ref 22–32)
Calcium: 10 mg/dL (ref 8.9–10.3)
Chloride: 96 mmol/L — ABNORMAL LOW (ref 98–111)
Creatinine, Ser: 3.42 mg/dL — ABNORMAL HIGH (ref 0.44–1.00)
GFR, Estimated: 14 mL/min — ABNORMAL LOW (ref >60)
Glucose, Bld: 215 mg/dL — ABNORMAL HIGH (ref 70–99)
Phosphorus: 7.8 mg/dL — ABNORMAL HIGH (ref 2.5–4.6)
Potassium: 3.3 mmol/L — ABNORMAL LOW (ref 3.5–5.1)
Sodium: 141 mmol/L (ref 135–145)

## 2019-11-16 LAB — BLOOD GAS, ARTERIAL
Acid-Base Excess: 2.7 mmol/L — ABNORMAL HIGH (ref 0.0–2.0)
Bicarbonate: 28.1 mmol/L — ABNORMAL HIGH (ref 20.0–28.0)
FIO2: 40
O2 Saturation: 96 %
Patient temperature: 37
pCO2 arterial: 54.3 mmHg — ABNORMAL HIGH (ref 32.0–48.0)
pH, Arterial: 7.334 — ABNORMAL LOW (ref 7.350–7.450)
pO2, Arterial: 86 mmHg (ref 83.0–108.0)

## 2019-11-16 LAB — CBC
HCT: 32.6 % — ABNORMAL LOW (ref 36.0–46.0)
Hemoglobin: 9.6 g/dL — ABNORMAL LOW (ref 12.0–15.0)
MCH: 26.5 pg (ref 26.0–34.0)
MCHC: 29.4 g/dL — ABNORMAL LOW (ref 30.0–36.0)
MCV: 90.1 fL (ref 80.0–100.0)
Platelets: 175 10*3/uL (ref 150–400)
RBC: 3.62 MIL/uL — ABNORMAL LOW (ref 3.87–5.11)
RDW: 19.3 % — ABNORMAL HIGH (ref 11.5–15.5)
WBC: 9 10*3/uL (ref 4.0–10.5)
nRBC: 0 % (ref 0.0–0.2)

## 2019-11-16 LAB — CULTURE, BLOOD (ROUTINE X 2)

## 2019-11-16 LAB — MAGNESIUM: Magnesium: 2.2 mg/dL (ref 1.7–2.4)

## 2019-11-16 LAB — LACTIC ACID, PLASMA: Lactic Acid, Venous: 1.2 mmol/L (ref 0.5–1.9)

## 2019-11-16 NOTE — Progress Notes (Signed)
[facility]  ROUNDING NOTE   Subjective:  Patient still having good urine output. However azotemia worsening as BUN up to 176 with a creatinine of 3.4. 1 out of 2 blood cultures on 10/30 showed staph epidermidis.   Objective:  Vital signs in last 24 hours:  Temperature 97.6 pulse 90 respirations 46 blood pressure 116/51.  Physical Exam: General:  Critically ill-appearing  Head:  Normocephalic, atraumatic. Moist oral mucosal membranes  Eyes:  Anicteric  Neck:  Tracheostomy in place  Lungs:   Coarse breath sounds bilateral, vent assisted  Heart:  S1S2 no rubs  Abdomen:   Soft, nontender, bowel sounds present  Extremities:  1+ peripheral edema.  Neurologic:  Awake, not following commands  Skin:  No acute rashes  Access:  Temporary right IJ dialysis catheter    Basic Metabolic Panel: Recent Labs  Lab 11/11/19 0437 11/11/19 0437 11/13/19 0426 11/14/19 0551 11/16/19 0431  NA 136  --  137 140 141  K 3.5  --  3.7 3.5 3.3*  CL 93*  --  93* 96* 96*  CO2 26  --  25 27 28   GLUCOSE 125*  --  159* 185* 215*  BUN 92*  --  129* 139* 176*  CREATININE 3.28*  --  3.79* 3.55* 3.42*  CALCIUM 10.3   < > 10.2 10.3 10.0  MG 2.3  --  2.3 2.3 2.2  PHOS 5.5*  --  7.7* 7.8* 7.8*   < > = values in this interval not displayed.    Liver Function Tests: Recent Labs  Lab 11/11/19 0437 11/13/19 0426 11/14/19 0551 11/16/19 0431  ALBUMIN 3.6 3.3* 3.3* 3.1*   No results for input(s): LIPASE, AMYLASE in the last 168 hours. No results for input(s): AMMONIA in the last 168 hours.  CBC: Recent Labs  Lab 11/11/19 0437 11/13/19 0426 11/14/19 0551 11/16/19 0431  WBC 10.8* 5.6 7.2 9.0  HGB 9.7* 9.7* 9.6* 9.6*  HCT 32.2* 31.9* 31.9* 32.6*  MCV 89.4 88.4 89.6 90.1  PLT 379 293 250 175    Cardiac Enzymes: No results for input(s): CKTOTAL, CKMB, CKMBINDEX, TROPONINI in the last 168 hours.  BNP: Invalid input(s): POCBNP  CBG: No results for input(s): GLUCAP in the last  168 hours.  Microbiology: Results for orders placed or performed during the hospital encounter of 11/05/19  Culture, respiratory (non-expectorated)     Status: None   Collection Time: 11/06/19  1:46 PM   Specimen: Tracheal Aspirate; Respiratory  Result Value Ref Range Status   Specimen Description TRACHEAL ASPIRATE  Final   Special Requests NONE  Final   Gram Stain   Final    RARE WBC PRESENT, PREDOMINANTLY PMN NO ORGANISMS SEEN    Culture   Final    RARE Normal respiratory flora-no Staph aureus or Pseudomonas seen Performed at [facility] [address] [place], [place] [address]    Report Status 11/08/2019 FINAL  Final  Urine Culture     Status: Abnormal   Collection Time: 11/10/19  1:36 PM   Specimen: Urine, Random  Result Value Ref Range Status   Specimen Description URINE, RANDOM  Final   Special Requests   Final    NONE Performed at [facility], [place] [address], [place], [place] [address]    Culture 60,000 COLONIES/mL YEAST (A)  Final   Report Status 11/11/2019 FINAL  Final  Culture, blood (routine x 2)     Status: None (Preliminary result)   Collection  Time: 11/12/19 10:00 PM   Specimen: BLOOD  Result Value Ref Range Status   Specimen Description BLOOD SITE NOT SPECIFIED  Final   Special Requests   Final    BOTTLES DRAWN AEROBIC AND ANAEROBIC Blood Culture results may not be optimal due to an excessive volume of blood received in culture bottles   Culture   Final    NO GROWTH 4 DAYS Performed at [place] [address], [place], [place] [address]    Report Status PENDING  Incomplete  Culture, blood (routine x 2)     Status: Abnormal (Preliminary result)   Collection Time: 11/12/19 10:06 PM   Specimen: BLOOD  Result Value Ref Range Status   Specimen Description BLOOD SITE NOT SPECIFIED  Final   Special Requests   Final    BOTTLES DRAWN AEROBIC AND ANAEROBIC Blood Culture results may not be optimal due to an excessive volume of blood  received in culture bottles   Culture  Setup Time   Final    GRAM POSITIVE COCCI ANAEROBIC BOTTLE ONLY CRITICAL RESULT CALLED TO, READ BACK BY AND VERIFIED WITH: RN J ALGHALI @0058  10/14/19 BY S GEZAHEGN     Culture (A)  Final    STAPHYLOCOCCUS EPIDERMIDIS SUSCEPTIBILITIES TO FOLLOW Performed at [facility], [place] [address], [place], [place] [address]    Report Status PENDING  Incomplete  Blood Culture ID Panel (Reflexed)     Status: Abnormal   Collection Time: 11/12/19 10:06 PM  Result Value Ref Range Status   Enterococcus faecalis NOT DETECTED NOT DETECTED Final   Enterococcus Faecium NOT DETECTED NOT DETECTED Final   Listeria monocytogenes NOT DETECTED NOT DETECTED Final   Staphylococcus species DETECTED (A) NOT DETECTED Final    Comment: CRITICAL RESULT CALLED TO, READ BACK BY AND VERIFIED WITH: RN J ALGHALI @0058  10/14/19 BY S GEZAHEGN     Staphylococcus aureus (BCID) NOT DETECTED NOT DETECTED Final   Staphylococcus epidermidis DETECTED (A) NOT DETECTED Final    Comment: Methicillin (oxacillin) resistant coagulase negative staphylococcus. Possible blood culture contaminant (unless isolated from more than one blood culture draw or clinical case suggests pathogenicity). No antibiotic treatment is indicated for blood  culture contaminants. CRITICAL RESULT CALLED TO, READ BACK BY AND VERIFIED WITH: RN J ALGHALI @0058  10/14/19 BY S GEZAHEGN     Staphylococcus lugdunensis NOT DETECTED NOT DETECTED Final   Streptococcus species NOT DETECTED NOT DETECTED Final   Streptococcus agalactiae NOT DETECTED NOT DETECTED Final   Streptococcus pneumoniae NOT DETECTED NOT DETECTED Final   Streptococcus pyogenes NOT DETECTED NOT DETECTED Final   A.calcoaceticus-baumannii NOT DETECTED NOT DETECTED Final   Bacteroides fragilis NOT DETECTED NOT DETECTED Final   Enterobacterales NOT DETECTED NOT DETECTED Final   Enterobacter cloacae complex NOT DETECTED NOT DETECTED Final   Escherichia coli NOT  DETECTED NOT DETECTED Final   Klebsiella aerogenes NOT DETECTED NOT DETECTED Final   Klebsiella oxytoca NOT DETECTED NOT DETECTED Final   Klebsiella pneumoniae NOT DETECTED NOT DETECTED Final   Proteus species NOT DETECTED NOT DETECTED Final   Salmonella species NOT DETECTED NOT DETECTED Final   Serratia marcescens NOT DETECTED NOT DETECTED Final   Haemophilus influenzae NOT DETECTED NOT DETECTED Final   Neisseria meningitidis NOT DETECTED NOT DETECTED Final   Pseudomonas aeruginosa NOT DETECTED NOT DETECTED Final   Stenotrophomonas maltophilia NOT DETECTED NOT DETECTED Final   Candida albicans NOT DETECTED NOT DETECTED Final   Candida auris NOT DETECTED NOT DETECTED Final  Candida glabrata NOT DETECTED NOT DETECTED Final   Candida krusei NOT DETECTED NOT DETECTED Final   Candida parapsilosis NOT DETECTED NOT DETECTED Final   Candida tropicalis NOT DETECTED NOT DETECTED Final   Cryptococcus neoformans/gattii NOT DETECTED NOT DETECTED Final   Methicillin resistance mecA/C DETECTED (A) NOT DETECTED Final    Comment: CRITICAL RESULT CALLED TO, READ BACK BY AND VERIFIED WITH: RN J ALGHALI @0058  10/14/19 BY S GEZAHEGN  Performed at [facility], [address] [place], [place] [address]   Culture, respiratory (non-expectorated)     Status: None   Collection Time: 11/12/19 10:23 PM   Specimen: Tracheal Aspirate; Respiratory  Result Value Ref Range Status   Specimen Description TRACHEAL ASPIRATE  Final   Special Requests NONE  Final   Gram Stain   Final    RARE WBC PRESENT,BOTH PMN AND MONONUCLEAR NO ORGANISMS SEEN Performed at [facility], [address], [place], [place] [address]    Culture FEW CANDIDA ALBICANS  Final   Report Status 11/15/2019 FINAL  Final  Culture, blood (routine x 2)     Status: None (Preliminary result)   Collection Time: 11/15/19  3:24 PM   Specimen: BLOOD RIGHT HAND  Result Value Ref Range Status   Specimen Description BLOOD RIGHT HAND  Final    Special Requests   Final    BOTTLES DRAWN AEROBIC AND ANAEROBIC Blood Culture adequate volume   Culture   Final    NO GROWTH < 24 HOURS Performed at [facility], [place] [address] [place], [place] [address]    Report Status PENDING  Incomplete  Culture, blood (routine x 2)     Status: None (Preliminary result)   Collection Time: 11/15/19  3:27 PM   Specimen: BLOOD LEFT HAND  Result Value Ref Range Status   Specimen Description BLOOD LEFT HAND  Final   Special Requests   Final    BOTTLES DRAWN AEROBIC AND ANAEROBIC Blood Culture adequate volume   Culture   Final    NO GROWTH < 24 HOURS Performed at [facility], [place] [address] [place], [place] [address]    Report Status PENDING  Incomplete    Coagulation Studies: No results for input(s): LABPROT, INR in the last 72 hours.  Urinalysis: No results for input(s): COLORURINE, LABSPEC, PHURINE, GLUCOSEU, HGBUR, BILIRUBINUR, KETONESUR, PROTEINUR, UROBILINOGEN, NITRITE, LEUKOCYTESUR in the last 72 hours.  Invalid input(s): APPERANCEUR    Imaging: No results found.   Medications:       Assessment/ Plan:  69 y.o. female with a PMHx of diabetes mellitus type 2, hyperlipidemia, plantar fasciitis, B12 deficiency, chronic low back pain, restless leg syndrome, GERD, nonalcoholic fatty liver disease, anxiety, nephrolithiasis, irritable bowel syndrome, hypertension, chronic kidney disease stage IIIa, who was admitted to [facility] on 11/05/2019 for ongoing treatment of post COVID-19 recovery.  1.  Acute kidney injury/chronic kidney disease stage IIIa.    Patient with reasonable urine output however BUN worsening.  Switch the patient to Osmolite in place of Nepro.  2.  Acute respiratory failure secondary to COVID-19 infection.    Continue ventilatory support at this time.  3.  Anemia of chronic kidney disease.    Hemoglobin currently 9.6.  Continue Retacrit.  4.  Secondary hyperparathyroidism.  Phosphorus continue high at  7.8.  We will add follows Lyrica.  5.  Bacteremia.  1 or 2 culture shows Staph epidermidis.  Dialysis catheter to be taken out.  Could potentially be  a contaminant.  Consider infectious disease consultation.   LOS: 0 Veronica Melton 11/3/20217:55 AM

## 2019-11-16 NOTE — Progress Notes (Signed)
  Echocardiogram 2D Echocardiogram has been performed.  Veronica Melton 11/16/2019, 2:41 PM

## 2019-11-17 ENCOUNTER — Other Ambulatory Visit (HOSPITAL_COMMUNITY): Payer: Medicare Other

## 2019-11-17 DIAGNOSIS — Z9911 Dependence on respirator [ventilator] status: Secondary | ICD-10-CM | POA: Diagnosis not present

## 2019-11-17 DIAGNOSIS — U071 COVID-19: Secondary | ICD-10-CM | POA: Diagnosis not present

## 2019-11-17 DIAGNOSIS — J9621 Acute and chronic respiratory failure with hypoxia: Secondary | ICD-10-CM | POA: Diagnosis not present

## 2019-11-17 DIAGNOSIS — N186 End stage renal disease: Secondary | ICD-10-CM | POA: Diagnosis not present

## 2019-11-17 LAB — CULTURE, BLOOD (ROUTINE X 2): Culture: NO GROWTH

## 2019-11-17 LAB — BLOOD GAS, ARTERIAL
Acid-Base Excess: 5.4 mmol/L — ABNORMAL HIGH (ref 0.0–2.0)
Acid-Base Excess: 2.5 mmol/L — ABNORMAL HIGH (ref 0.0–2.0)
Bicarbonate: 30.1 mmol/L — ABNORMAL HIGH (ref 20.0–28.0)
Bicarbonate: 28 mmol/L (ref 20.0–28.0)
FIO2: 40
FIO2: 60
O2 Saturation: 89.3 %
O2 Saturation: 99 %
Patient temperature: 36.8
Patient temperature: 37.8
pCO2 arterial: 49.9 mmHg — ABNORMAL HIGH (ref 32.0–48.0)
pCO2 arterial: 58.6 mmHg — ABNORMAL HIGH (ref 32.0–48.0)
pH, Arterial: 7.397 (ref 7.350–7.450)
pH, Arterial: 7.306 — ABNORMAL LOW (ref 7.350–7.450)
pO2, Arterial: 57.8 mmHg — ABNORMAL LOW (ref 83.0–108.0)
pO2, Arterial: 170 mmHg — ABNORMAL HIGH (ref 83.0–108.0)

## 2019-11-17 LAB — ECHOCARDIOGRAM COMPLETE
Area-P 1/2: 3.65 cm2
Calc EF: 50.6 %
S' Lateral: 3 cm
Single Plane A2C EF: 56.2 %
Single Plane A4C EF: 42.4 %

## 2019-11-17 LAB — BASIC METABOLIC PANEL
Anion gap: 16 — ABNORMAL HIGH (ref 5–15)
BUN: 170 mg/dL — ABNORMAL HIGH (ref 8–23)
CO2: 28 mmol/L (ref 22–32)
Calcium: 9.6 mg/dL (ref 8.9–10.3)
Chloride: 98 mmol/L (ref 98–111)
Creatinine, Ser: 3.33 mg/dL — ABNORMAL HIGH (ref 0.44–1.00)
GFR, Estimated: 14 mL/min — ABNORMAL LOW (ref >60)
Glucose, Bld: 189 mg/dL — ABNORMAL HIGH (ref 70–99)
Potassium: 4.3 mmol/L (ref 3.5–5.1)
Sodium: 142 mmol/L (ref 135–145)

## 2019-11-17 LAB — CBC
HCT: 30.2 % — ABNORMAL LOW (ref 36.0–46.0)
Hemoglobin: 8.9 g/dL — ABNORMAL LOW (ref 12.0–15.0)
MCH: 26.6 pg (ref 26.0–34.0)
MCHC: 29.5 g/dL — ABNORMAL LOW (ref 30.0–36.0)
MCV: 90.4 fL (ref 80.0–100.0)
Platelets: 144 10*3/uL — ABNORMAL LOW (ref 150–400)
RBC: 3.34 MIL/uL — ABNORMAL LOW (ref 3.87–5.11)
RDW: 19 % — ABNORMAL HIGH (ref 11.5–15.5)
WBC: 4.9 10*3/uL (ref 4.0–10.5)
nRBC: 0 % (ref 0.0–0.2)

## 2019-11-17 LAB — MAGNESIUM: Magnesium: 2.5 mg/dL — ABNORMAL HIGH (ref 1.7–2.4)

## 2019-11-17 LAB — TROPONIN I (HIGH SENSITIVITY): Troponin I (High Sensitivity): 31 ng/L — ABNORMAL HIGH (ref <18)

## 2019-11-17 NOTE — Progress Notes (Signed)
Pulmonary Critical Care Medicine [place]   PULMONARY CRITICAL CARE SERVICE  PROGRESS NOTE  Date of Service: 11/17/2019  Veronica Melton  OAC:[id number]  DOB: May 10, 1950   DOA: 11/05/2019  Referring Physician: Merton Border, MD  HPI: Veronica Melton is a 69 y.o. female seen for follow up of Acute on Chronic Respiratory Failure.  She continues to have increased ventilatory demand.  Patient respiratory rate was in 50s this morning.  Spoke with respiratory therapy during rounds to switch her over to pressure control mode and also she needs to have her sedation increased.  Apparently dialysis been held for the last couple days may need further fluid removal.  Medications: Reviewed on Rounds  Physical Exam:  Vitals: Temperature 99.2 pulse 97 respiratory rate was 50 blood pressure is 119/49 saturations 98%  Ventilator Settings on assist control FiO2 is 40% tidal volume is 420 PEEP 8  . General: Comfortable at this time . Eyes: Grossly normal lids, irises & conjunctiva . ENT: grossly tongue is normal . Neck: no obvious mass . Cardiovascular: S1 S2 normal no gallop . Respiratory: No rhonchi very coarse breath sounds . Abdomen: soft . Skin: no rash seen on limited exam . Musculoskeletal: not rigid . Psychiatric:unable to assess . Neurologic: no seizure no involuntary movements         Lab Data:   Basic Metabolic Panel: Recent Labs  Lab 11/11/19 0437 11/13/19 0426 11/14/19 0551 11/16/19 0431 11/17/19 0422  NA 136 137 140 141 142  K 3.5 3.7 3.5 3.3* 4.3  CL 93* 93* 96* 96* 98  CO2 26 25 27 28 28   GLUCOSE 125* 159* 185* 215* 189*  BUN 92* 129* 139* 176* 170*  CREATININE 3.28* 3.79* 3.55* 3.42* 3.33*  CALCIUM 10.3 10.2 10.3 10.0 9.6  MG 2.3 2.3 2.3 2.2  --   PHOS 5.5* 7.7* 7.8* 7.8*  --     ABG: Recent Labs  Lab 11/11/19 1522 11/12/19 2135 11/13/19 0010 11/16/19 0137 11/17/19 0800  PHART 7.330* 7.328* 7.327* 7.334* 7.397  PCO2ART 51.8* 54.2* 55.6*  54.3* 49.9*  PO2ART 190* 117* 126* 86.0 57.8*  HCO3 26.7 27.4 28.3* 28.1* 30.1*  O2SAT 99.1 98.0 98.2 96.0 89.3    Liver Function Tests: Recent Labs  Lab 11/11/19 0437 11/13/19 0426 11/14/19 0551 11/16/19 0431  ALBUMIN 3.6 3.3* 3.3* 3.1*   No results for input(s): LIPASE, AMYLASE in the last 168 hours. No results for input(s): AMMONIA in the last 168 hours.  CBC: Recent Labs  Lab 11/11/19 0437 11/13/19 0426 11/14/19 0551 11/16/19 0431  WBC 10.8* 5.6 7.2 9.0  HGB 9.7* 9.7* 9.6* 9.6*  HCT 32.2* 31.9* 31.9* 32.6*  MCV 89.4 88.4 89.6 90.1  PLT 379 293 250 175    Cardiac Enzymes: No results for input(s): CKTOTAL, CKMB, CKMBINDEX, TROPONINI in the last 168 hours.  BNP (last 3 results) No results for input(s): BNP in the last 8760 hours.  ProBNP (last 3 results) No results for input(s): PROBNP in the last 8760 hours.  Radiological Exams: DG Chest Port 1 View  Result Date: 11/17/2019 CLINICAL DATA:  Pneumonia. EXAM: PORTABLE CHEST 1 VIEW COMPARISON:  11/14/2019. FINDINGS: Tracheostomy tube and right central lines in stable position. Heart size stable. Diffuse bilateral interstitial infiltrates/edema again noted without interim change. No pleural effusion or pneumothorax. IMPRESSION: 1. Tracheostomy tube and right central lines in stable position. 2. Diffuse bilateral interstitial infiltrates/edema again noted without interim change. Electronically Signed   By: Marcello Moores  Register   On:  11/17/2019 05:42   ECHOCARDIOGRAM COMPLETE  Result Date: 11/17/2019    ECHOCARDIOGRAM REPORT   Patient Name:   Veronica Melton Date of Exam: 11/16/2019 Medical Rec #:  [id number]     Height: Accession #:    [id number]    Weight: Date of Birth:  [birth year]     BSA: Patient Age:    32 years      BP:           116/51 mmHg Patient Gender: F             HR:           98 bpm. Exam Location:  Inpatient Procedure: 2D Echo, Cardiac Doppler and Color Doppler Indications:     Bacteremia 790.7 / R78.81  History:          Patient has no prior history of Echocardiogram examinations.                  Risk Factors:Diabetes and Dyslipidemia. GERD.  Sonographer:     Vickie Epley RDCS Referring Phys:  [place] Diagnosing Phys: Dixie Dials MD  Sonographer Comments: Echo performed with patient supine and on artificial respirator. IMPRESSIONS  1. Left ventricular ejection fraction, by estimation, is 55 to 60%. The left ventricle has normal function. The left ventricle has no regional wall motion abnormalities. Left ventricular diastolic parameters are consistent with Grade I diastolic dysfunction (impaired relaxation).  2. Right ventricular systolic function is normal. The right ventricular size is normal.  3. The mitral valve is degenerative. Trivial mitral valve regurgitation.  4. The aortic valve is tricuspid. There is mild calcification of the aortic valve. There is mild thickening of the aortic valve. Aortic valve regurgitation is not visualized. Mild aortic valve sclerosis is present, with no evidence of aortic valve stenosis.  5. The inferior vena cava is normal in size with greater than 50% respiratory variability, suggesting right atrial pressure of 3 mmHg. Conclusion(s)/Recommendation(s): No evidence of valvular vegetations on this transthoracic echocardiogram. Would recommend a transesophageal echocardiogram to exclude infective endocarditis if clinically indicated. FINDINGS  Left Ventricle: Left ventricular ejection fraction, by estimation, is 55 to 60%. The left ventricle has normal function. The left ventricle has no regional wall motion abnormalities. The left ventricular internal cavity size was normal in size. There is  no left ventricular hypertrophy. Left ventricular diastolic parameters are consistent with Grade I diastolic dysfunction (impaired relaxation). Right Ventricle: The right ventricular size is normal. No increase in right ventricular wall thickness. Right ventricular systolic function is normal.  Left Atrium: Left atrial size was normal in size. Right Atrium: Right atrial size was normal in size. Pericardium: There is no evidence of pericardial effusion. Mitral Valve: The mitral valve is degenerative in appearance. There is mild thickening of the mitral valve leaflet(s). There is mild calcification of the mitral valve leaflet(s). Trivial mitral valve regurgitation. Tricuspid Valve: The tricuspid valve is normal in structure. Tricuspid valve regurgitation is trivial. Aortic Valve: The aortic valve is tricuspid. There is mild calcification of the aortic valve. There is mild thickening of the aortic valve. Aortic valve regurgitation is not visualized. Mild aortic valve sclerosis is present, with no evidence of aortic valve stenosis. Pulmonic Valve: The pulmonic valve was normal in structure. Pulmonic valve regurgitation is not visualized. Aorta: The aortic root is normal in size and structure. Venous: The inferior vena cava is normal in size with greater than 50% respiratory variability, suggesting right atrial pressure of 3 mmHg. IAS/Shunts: The  interatrial septum was not assessed.  LEFT VENTRICLE PLAX 2D LVIDd:         4.30 cm     Diastology LVIDs:         3.00 cm     LV e' medial:    6.85 cm/s LV PW:         1.00 cm     LV E/e' medial:  15.5 LV IVS:        1.00 cm     LV e' lateral:   6.96 cm/s LVOT diam:     2.00 cm     LV E/e' lateral: 15.2 LV SV:         64 LVOT Area:     3.14 cm  LV Volumes (MOD) LV vol d, MOD A2C: 76.3 ml LV vol d, MOD A4C: 78.5 ml LV vol s, MOD A2C: 33.4 ml LV vol s, MOD A4C: 45.2 ml LV SV MOD A2C:     42.9 ml LV SV MOD A4C:     78.5 ml LV SV MOD BP:      40.5 ml RIGHT VENTRICLE RV S prime:     15.10 cm/s TAPSE (M-mode): 1.8 cm LEFT ATRIUM             RIGHT ATRIUM LA diam:        4.20 cm RA Area:     11.60 cm LA Vol (A2C):   26.6 ml RA Volume:   21.60 ml LA Vol (A4C):   34.2 ml LA Biplane Vol: 30.3 ml  AORTIC VALVE LVOT Vmax:   108.00 cm/s LVOT Vmean:  78.400 cm/s LVOT VTI:    0.203  m  AORTA Ao Root diam: 3.00 cm MITRAL VALVE MV Area (PHT): 3.65 cm     SHUNTS MV Decel Time: 208 msec     Systemic VTI:  0.20 m MV E velocity: 106.00 cm/s  Systemic Diam: 2.00 cm MV A velocity: 115.00 cm/s MV E/A ratio:  0.92 Dixie Dials MD Electronically signed by Dixie Dials MD Signature Date/Time: 11/17/2019/12:03:47 AM    Final     Assessment/Plan Active Problems:   Acute on chronic respiratory failure with hypoxia ([facility])   End stage renal disease on dialysis ([place])   COVID-19 virus infection   Pneumonia due to COVID-19 virus   1. Acute on chronic respiratory failure with hypoxia patient continues to have high ventilatory demand right now is on full support and assist control mode switch over to pressure control mode also added to propofol and then will try to taper down her Versed and fentanyl if able to tolerate. 2. End-stage renal disease on dialysis has had good urine output elevating BUN noted however.  She might actually need more fluid removal. 3. COVID-19 virus infection in recovery and prognosis guarded 4. Pneumonia due to COVID-19 chest x-ray with still significant changes noted likely combination of post infection and fluid. 5. Sepsis she had fever couple days ago cultures growing staph epi possible contaminant ID has been requested to see the patient   I have personally seen and evaluated the patient, evaluated laboratory and imaging results, formulated the assessment and plan and placed orders. The Patient requires high complexity decision making with multiple systems involvement.  Rounds were done with the Respiratory Therapy Director and Staff therapists and discussed with nursing staff also.  Allyne Gee, MD [facility] Pulmonary Critical Care Medicine Sleep Medicine

## 2019-11-18 DIAGNOSIS — U071 COVID-19: Secondary | ICD-10-CM | POA: Diagnosis not present

## 2019-11-18 DIAGNOSIS — N186 End stage renal disease: Secondary | ICD-10-CM | POA: Diagnosis not present

## 2019-11-18 DIAGNOSIS — Z9911 Dependence on respirator [ventilator] status: Secondary | ICD-10-CM | POA: Diagnosis not present

## 2019-11-18 DIAGNOSIS — J9621 Acute and chronic respiratory failure with hypoxia: Secondary | ICD-10-CM | POA: Diagnosis not present

## 2019-11-18 LAB — IRON AND TIBC
Iron: 153 ug/dL (ref 28–170)
Saturation Ratios: 56 % — ABNORMAL HIGH (ref 10.4–31.8)
TIBC: 274 ug/dL (ref 250–450)
UIBC: 121 ug/dL

## 2019-11-18 LAB — CBC
HCT: 27.3 % — ABNORMAL LOW (ref 36.0–46.0)
Hemoglobin: 8.2 g/dL — ABNORMAL LOW (ref 12.0–15.0)
MCH: 27.5 pg (ref 26.0–34.0)
MCHC: 30 g/dL (ref 30.0–36.0)
MCV: 91.6 fL (ref 80.0–100.0)
Platelets: 133 10*3/uL — ABNORMAL LOW (ref 150–400)
RBC: 2.98 MIL/uL — ABNORMAL LOW (ref 3.87–5.11)
RDW: 19.1 % — ABNORMAL HIGH (ref 11.5–15.5)
WBC: 3.8 10*3/uL — ABNORMAL LOW (ref 4.0–10.5)
nRBC: 0 % (ref 0.0–0.2)

## 2019-11-18 LAB — RENAL FUNCTION PANEL
Albumin: 3 g/dL — ABNORMAL LOW (ref 3.5–5.0)
Anion gap: 16 — ABNORMAL HIGH (ref 5–15)
BUN: 175 mg/dL — ABNORMAL HIGH (ref 8–23)
CO2: 28 mmol/L (ref 22–32)
Calcium: 9.1 mg/dL (ref 8.9–10.3)
Chloride: 96 mmol/L — ABNORMAL LOW (ref 98–111)
Creatinine, Ser: 3.31 mg/dL — ABNORMAL HIGH (ref 0.44–1.00)
GFR, Estimated: 14 mL/min — ABNORMAL LOW (ref >60)
Glucose, Bld: 191 mg/dL — ABNORMAL HIGH (ref 70–99)
Phosphorus: 6.6 mg/dL — ABNORMAL HIGH (ref 2.5–4.6)
Potassium: 4.6 mmol/L (ref 3.5–5.1)
Sodium: 140 mmol/L (ref 135–145)

## 2019-11-18 LAB — BLOOD GAS, ARTERIAL
Acid-Base Excess: 3.8 mmol/L — ABNORMAL HIGH (ref 0.0–2.0)
Bicarbonate: 29.4 mmol/L — ABNORMAL HIGH (ref 20.0–28.0)
FIO2: 40
O2 Saturation: 96 %
Patient temperature: 36.7
pCO2 arterial: 58 mmHg — ABNORMAL HIGH (ref 32.0–48.0)
pH, Arterial: 7.324 — ABNORMAL LOW (ref 7.350–7.450)
pO2, Arterial: 86.6 mmHg (ref 83.0–108.0)

## 2019-11-18 LAB — MAGNESIUM: Magnesium: 2.5 mg/dL — ABNORMAL HIGH (ref 1.7–2.4)

## 2019-11-18 LAB — TRIGLYCERIDES: Triglycerides: 383 mg/dL — ABNORMAL HIGH (ref <150)

## 2019-11-18 LAB — FERRITIN: Ferritin: 1525 ng/mL — ABNORMAL HIGH (ref 11–307)

## 2019-11-18 NOTE — Progress Notes (Signed)
[facility]  ROUNDING NOTE   Subjective:  Creatinine appears to be stable at 3.3. Patient is making urine. Still has significant azotemia with BUN of 175. Was given Lasix yesterday.   Objective:  Vital signs in last 24 hours:  Temperature 96.2 pulse sixty-seven respirations fourteen blood pressure 89/42  Physical Exam: General:  Critically ill-appearing  Head:  Normocephalic, atraumatic. Moist oral mucosal membranes  Eyes:  Anicteric  Neck:  Tracheostomy in place  Lungs:   Coarse breath sounds bilateral, vent assisted  Heart:  S1S2 no rubs  Abdomen:   Soft, nontender, bowel sounds present  Extremities:  Trace peripheral edema.  Neurologic:  Awake, not following commands  Skin:  No acute rashes  Access:  Temporary right IJ dialysis catheter    Basic Metabolic Panel: Recent Labs  Lab 11/13/19 0426 11/13/19 0426 11/14/19 0551 11/14/19 0551 11/16/19 0431 11/17/19 0422 11/17/19 1211 11/18/19 0508  NA 137  --  140  --  141 142  --  140  K 3.7  --  3.5  --  3.3* 4.3  --  4.6  CL 93*  --  96*  --  96* 98  --  96*  CO2 25  --  27  --  28 28  --  28  GLUCOSE 159*  --  185*  --  215* 189*  --  191*  BUN 129*  --  139*  --  176* 170*  --  175*  CREATININE 3.79*  --  3.55*  --  3.42* 3.33*  --  3.31*  CALCIUM 10.2   < > 10.3   < > 10.0 9.6  --  9.1  MG 2.3  --  2.3  --  2.2  --  2.5* 2.5*  PHOS 7.7*  --  7.8*  --  7.8*  --   --  6.6*   < > = values in this interval not displayed.    Liver Function Tests: Recent Labs  Lab 11/13/19 0426 11/14/19 0551 11/16/19 0431 11/18/19 0508  ALBUMIN 3.3* 3.3* 3.1* 3.0*   No results for input(s): LIPASE, AMYLASE in the last 168 hours. No results for input(s): AMMONIA in the last 168 hours.  CBC: Recent Labs  Lab 11/13/19 0426 11/14/19 0551 11/16/19 0431 11/17/19 1211 11/18/19 0508  WBC 5.6 7.2 9.0 4.9 3.8*  HGB 9.7* 9.6* 9.6* 8.9* 8.2*  HCT 31.9* 31.9* 32.6* 30.2* 27.3*  MCV 88.4 89.6 90.1 90.4 91.6  PLT  293 250 175 144* 133*    Cardiac Enzymes: No results for input(s): CKTOTAL, CKMB, CKMBINDEX, TROPONINI in the last 168 hours.  BNP: Invalid input(s): POCBNP  CBG: No results for input(s): GLUCAP in the last 168 hours.  Microbiology: Results for orders placed or performed during the hospital encounter of 11/05/19  Culture, respiratory (non-expectorated)     Status: None   Collection Time: 11/06/19  1:46 PM   Specimen: Tracheal Aspirate; Respiratory  Result Value Ref Range Status   Specimen Description TRACHEAL ASPIRATE  Final   Special Requests NONE  Final   Gram Stain   Final    RARE WBC PRESENT, PREDOMINANTLY PMN NO ORGANISMS SEEN    Culture   Final    RARE Normal respiratory flora-no Staph aureus or Pseudomonas seen Performed at [facility] [address], [place], [place] [address]    Report Status 11/08/2019 FINAL  Final  Urine Culture     Status: Abnormal   Collection Time: 11/10/19  1:36  PM   Specimen: Urine, Random  Result Value Ref Range Status   Specimen Description URINE, RANDOM  Final   Special Requests   Final    NONE Performed at [facility], [address], [place], [place] [address]    Culture 60,000 COLONIES/mL YEAST (A)  Final   Report Status 11/11/2019 FINAL  Final  Culture, blood (routine x 2)     Status: None   Collection Time: 11/12/19 10:00 PM   Specimen: BLOOD  Result Value Ref Range Status   Specimen Description BLOOD SITE NOT SPECIFIED  Final   Special Requests   Final    BOTTLES DRAWN AEROBIC AND ANAEROBIC Blood Culture results may not be optimal due to an excessive volume of blood received in culture bottles   Culture   Final    NO GROWTH 5 DAYS Performed at [facility], [place] [address], [place], [place] [address]    Report Status 11/17/2019 FINAL  Final  Culture, blood (routine x 2)     Status: Abnormal   Collection Time: 11/12/19 10:06 PM   Specimen: BLOOD  Result Value Ref Range Status   Specimen Description  BLOOD SITE NOT SPECIFIED  Final   Special Requests   Final    BOTTLES DRAWN AEROBIC AND ANAEROBIC Blood Culture results may not be optimal due to an excessive volume of blood received in culture bottles   Culture  Setup Time   Final    GRAM POSITIVE COCCI ANAEROBIC BOTTLE ONLY CRITICAL RESULT CALLED TO, READ BACK BY AND VERIFIED WITH: RN J ALGHALI @0058  10/14/19 BY S GEZAHEGN  Performed at [facility], [place] [address], [place], [place] [address]    Culture STAPHYLOCOCCUS EPIDERMIDIS (A)  Final   Report Status 11/16/2019 FINAL  Final   Organism ID, Bacteria STAPHYLOCOCCUS EPIDERMIDIS  Final      Susceptibility   Staphylococcus epidermidis - MIC*    CIPROFLOXACIN >=8 RESISTANT Resistant     ERYTHROMYCIN >=8 RESISTANT Resistant     GENTAMICIN 8 INTERMEDIATE Intermediate     OXACILLIN >=4 RESISTANT Resistant     TETRACYCLINE 2 SENSITIVE Sensitive     VANCOMYCIN 1 SENSITIVE Sensitive     TRIMETH/SULFA 80 RESISTANT Resistant     CLINDAMYCIN >=8 RESISTANT Resistant     RIFAMPIN <=0.5 SENSITIVE Sensitive     Inducible Clindamycin NEGATIVE Sensitive     * STAPHYLOCOCCUS EPIDERMIDIS  Blood Culture ID Panel (Reflexed)     Status: Abnormal   Collection Time: 11/12/19 10:06 PM  Result Value Ref Range Status   Enterococcus faecalis NOT DETECTED NOT DETECTED Final   Enterococcus Faecium NOT DETECTED NOT DETECTED Final   Listeria monocytogenes NOT DETECTED NOT DETECTED Final   Staphylococcus species DETECTED (A) NOT DETECTED Final    Comment: CRITICAL RESULT CALLED TO, READ BACK BY AND VERIFIED WITH: RN J ALGHALI @0058  10/14/19 BY S GEZAHEGN     Staphylococcus aureus (BCID) NOT DETECTED NOT DETECTED Final   Staphylococcus epidermidis DETECTED (A) NOT DETECTED Final    Comment: Methicillin (oxacillin) resistant coagulase negative staphylococcus. Possible blood culture contaminant (unless isolated from more than one blood culture draw or clinical case suggests pathogenicity). No antibiotic  treatment is indicated for blood  culture contaminants. CRITICAL RESULT CALLED TO, READ BACK BY AND VERIFIED WITH: RN J ALGHALI @0058  10/14/19 BY S GEZAHEGN     Staphylococcus lugdunensis NOT DETECTED NOT DETECTED Final   Streptococcus species NOT DETECTED NOT DETECTED Final   Streptococcus  agalactiae NOT DETECTED NOT DETECTED Final   Streptococcus pneumoniae NOT DETECTED NOT DETECTED Final   Streptococcus pyogenes NOT DETECTED NOT DETECTED Final   A.calcoaceticus-baumannii NOT DETECTED NOT DETECTED Final   Bacteroides fragilis NOT DETECTED NOT DETECTED Final   Enterobacterales NOT DETECTED NOT DETECTED Final   Enterobacter cloacae complex NOT DETECTED NOT DETECTED Final   Escherichia coli NOT DETECTED NOT DETECTED Final   Klebsiella aerogenes NOT DETECTED NOT DETECTED Final   Klebsiella oxytoca NOT DETECTED NOT DETECTED Final   Klebsiella pneumoniae NOT DETECTED NOT DETECTED Final   Proteus species NOT DETECTED NOT DETECTED Final   Salmonella species NOT DETECTED NOT DETECTED Final   Serratia marcescens NOT DETECTED NOT DETECTED Final   Haemophilus influenzae NOT DETECTED NOT DETECTED Final   Neisseria meningitidis NOT DETECTED NOT DETECTED Final   Pseudomonas aeruginosa NOT DETECTED NOT DETECTED Final   Stenotrophomonas maltophilia NOT DETECTED NOT DETECTED Final   Candida albicans NOT DETECTED NOT DETECTED Final   Candida auris NOT DETECTED NOT DETECTED Final   Candida glabrata NOT DETECTED NOT DETECTED Final   Candida krusei NOT DETECTED NOT DETECTED Final   Candida parapsilosis NOT DETECTED NOT DETECTED Final   Candida tropicalis NOT DETECTED NOT DETECTED Final   Cryptococcus neoformans/gattii NOT DETECTED NOT DETECTED Final   Methicillin resistance mecA/C DETECTED (A) NOT DETECTED Final    Comment: CRITICAL RESULT CALLED TO, READ BACK BY AND VERIFIED WITH: RN J ALGHALI @0058  10/14/19 BY S GEZAHEGN  Performed at [facility], [address] [place], [place] [address]    Culture, respiratory (non-expectorated)     Status: None   Collection Time: 11/12/19 10:23 PM   Specimen: Tracheal Aspirate; Respiratory  Result Value Ref Range Status   Specimen Description TRACHEAL ASPIRATE  Final   Special Requests NONE  Final   Gram Stain   Final    RARE WBC PRESENT,BOTH PMN AND MONONUCLEAR NO ORGANISMS SEEN Performed at [facility], [address], [place], [place] [address]    Culture FEW CANDIDA ALBICANS  Final   Report Status 11/15/2019 FINAL  Final  Culture, blood (routine x 2)     Status: None (Preliminary result)   Collection Time: 11/15/19  3:24 PM   Specimen: BLOOD RIGHT HAND  Result Value Ref Range Status   Specimen Description BLOOD RIGHT HAND  Final   Special Requests   Final    BOTTLES DRAWN AEROBIC AND ANAEROBIC Blood Culture adequate volume   Culture   Final    NO GROWTH 3 DAYS Performed at [facility], [place] [address], [place], [place] [address]    Report Status PENDING  Incomplete  Culture, blood (routine x 2)     Status: None (Preliminary result)   Collection Time: 11/15/19  3:27 PM   Specimen: BLOOD LEFT HAND  Result Value Ref Range Status   Specimen Description BLOOD LEFT HAND  Final   Special Requests   Final    BOTTLES DRAWN AEROBIC AND ANAEROBIC Blood Culture adequate volume   Culture   Final    NO GROWTH 3 DAYS Performed at [facility], [place] [address], [place], [place] [address]    Report Status PENDING  Incomplete    Coagulation Studies: No results for input(s): LABPROT, INR in the last 72 hours.  Urinalysis: No results for input(s): COLORURINE, LABSPEC, PHURINE, GLUCOSEU, HGBUR, BILIRUBINUR, KETONESUR, PROTEINUR, UROBILINOGEN, NITRITE, LEUKOCYTESUR in the last 72 hours.  Invalid input(s): APPERANCEUR    Imaging:  CT HEAD WO CONTRAST  Result Date: 11/17/2019 CLINICAL DATA:  Mental status change, unknown cause. Pneumonia and sepsis. EXAM: CT HEAD WITHOUT CONTRAST TECHNIQUE: Contiguous axial images  were obtained from the base of the skull through the vertex without intravenous contrast. COMPARISON:  No pertinent prior exams are available for comparison. FINDINGS: Brain: Mildly motion degraded examination. Mild generalized cerebral atrophy. Mild ill-defined hypoattenuation within the cerebral white matter is nonspecific, but compatible with chronic small vessel ischemic disease. There is no acute intracranial hemorrhage. No demarcated cortical infarct. No extra-axial fluid collection. No evidence of intracranial mass. No midline shift. Vascular: No hyperdense vessel.  Atherosclerotic calcifications. Skull: Normal. Negative for fracture or focal suspicious lesion. Sinuses/Orbits: Visualized orbits show no acute finding. Mild left sphenoid sinus mucosal thickening. Other: Bilateral mastoid effusions. IMPRESSION: Mildly motion degraded examination. No evidence of acute intracranial abnormality. Mild cerebral atrophy and chronic small vessel ischemic disease. Mild left sphenoid sinus mucosal thickening. Bilateral mastoid effusions. Electronically Signed   By: Kellie Simmering DO   On: 11/17/2019 10:24   DG Chest Port 1 View  Result Date: 11/17/2019 CLINICAL DATA:  Pneumonia. EXAM: PORTABLE CHEST 1 VIEW COMPARISON:  11/14/2019. FINDINGS: Tracheostomy tube and right central lines in stable position. Heart size stable. Diffuse bilateral interstitial infiltrates/edema again noted without interim change. No pleural effusion or pneumothorax. IMPRESSION: 1. Tracheostomy tube and right central lines in stable position. 2. Diffuse bilateral interstitial infiltrates/edema again noted without interim change. Electronically Signed   By: Marcello Moores  Register   On: 11/17/2019 05:42   ECHOCARDIOGRAM COMPLETE  Result Date: 11/17/2019    ECHOCARDIOGRAM REPORT   Patient Name:   Veronica Melton Date of Exam: 11/16/2019 Medical Rec #:  [id number]     Height: Accession #:    [id number]    Weight: Date of Birth:  Apr 11, 1950     BSA: Patient  Age:    58 years      BP:           116/51 mmHg Patient Gender: F             HR:           98 bpm. Exam Location:  Inpatient Procedure: 2D Echo, Cardiac Doppler and Color Doppler Indications:     Bacteremia 790.7 / R78.81  History:         Patient has no prior history of Echocardiogram examinations.                  Risk Factors:Diabetes and Dyslipidemia. GERD.  Sonographer:     Vickie Epley RDCS Referring Phys:  [place] Diagnosing Phys: Dixie Dials MD  Sonographer Comments: Echo performed with patient supine and on artificial respirator. IMPRESSIONS  1. Left ventricular ejection fraction, by estimation, is 55 to 60%. The left ventricle has normal function. The left ventricle has no regional wall motion abnormalities. Left ventricular diastolic parameters are consistent with Grade I diastolic dysfunction (impaired relaxation).  2. Right ventricular systolic function is normal. The right ventricular size is normal.  3. The mitral valve is degenerative. Trivial mitral valve regurgitation.  4. The aortic valve is tricuspid. There is mild calcification of the aortic valve. There is mild thickening of the aortic valve. Aortic valve regurgitation is not visualized. Mild aortic valve sclerosis is present, with no evidence of aortic valve stenosis.  5. The inferior vena cava is normal in size with greater than 50% respiratory variability, suggesting right atrial pressure of 3 mmHg. Conclusion(s)/Recommendation(s): No evidence of  valvular vegetations on this transthoracic echocardiogram. Would recommend a transesophageal echocardiogram to exclude infective endocarditis if clinically indicated. FINDINGS  Left Ventricle: Left ventricular ejection fraction, by estimation, is 55 to 60%. The left ventricle has normal function. The left ventricle has no regional wall motion abnormalities. The left ventricular internal cavity size was normal in size. There is  no left ventricular hypertrophy. Left ventricular diastolic  parameters are consistent with Grade I diastolic dysfunction (impaired relaxation). Right Ventricle: The right ventricular size is normal. No increase in right ventricular wall thickness. Right ventricular systolic function is normal. Left Atrium: Left atrial size was normal in size. Right Atrium: Right atrial size was normal in size. Pericardium: There is no evidence of pericardial effusion. Mitral Valve: The mitral valve is degenerative in appearance. There is mild thickening of the mitral valve leaflet(s). There is mild calcification of the mitral valve leaflet(s). Trivial mitral valve regurgitation. Tricuspid Valve: The tricuspid valve is normal in structure. Tricuspid valve regurgitation is trivial. Aortic Valve: The aortic valve is tricuspid. There is mild calcification of the aortic valve. There is mild thickening of the aortic valve. Aortic valve regurgitation is not visualized. Mild aortic valve sclerosis is present, with no evidence of aortic valve stenosis. Pulmonic Valve: The pulmonic valve was normal in structure. Pulmonic valve regurgitation is not visualized. Aorta: The aortic root is normal in size and structure. Venous: The inferior vena cava is normal in size with greater than 50% respiratory variability, suggesting right atrial pressure of 3 mmHg. IAS/Shunts: The interatrial septum was not assessed.  LEFT VENTRICLE PLAX 2D LVIDd:         4.30 cm     Diastology LVIDs:         3.00 cm     LV e' medial:    6.85 cm/s LV PW:         1.00 cm     LV E/e' medial:  15.5 LV IVS:        1.00 cm     LV e' lateral:   6.96 cm/s LVOT diam:     2.00 cm     LV E/e' lateral: 15.2 LV SV:         64 LVOT Area:     3.14 cm  LV Volumes (MOD) LV vol d, MOD A2C: 76.3 ml LV vol d, MOD A4C: 78.5 ml LV vol s, MOD A2C: 33.4 ml LV vol s, MOD A4C: 45.2 ml LV SV MOD A2C:     42.9 ml LV SV MOD A4C:     78.5 ml LV SV MOD BP:      40.5 ml RIGHT VENTRICLE RV S prime:     15.10 cm/s TAPSE (M-mode): 1.8 cm LEFT ATRIUM              RIGHT ATRIUM LA diam:        4.20 cm RA Area:     11.60 cm LA Vol (A2C):   26.6 ml RA Volume:   21.60 ml LA Vol (A4C):   34.2 ml LA Biplane Vol: 30.3 ml  AORTIC VALVE LVOT Vmax:   108.00 cm/s LVOT Vmean:  78.400 cm/s LVOT VTI:    0.203 m  AORTA Ao Root diam: 3.00 cm MITRAL VALVE MV Area (PHT): 3.65 cm     SHUNTS MV Decel Time: 208 msec     Systemic VTI:  0.20 m MV E velocity: 106.00 cm/s  Systemic Diam: 2.00 cm MV A velocity: 115.00 cm/s MV E/A ratio:  0.92 Dixie Dials MD Electronically signed by Dixie Dials MD Signature Date/Time: 11/17/2019/12:03:47 AM    Final    CT CHEST ABDOMEN PELVIS WO CONTRAST  Result Date: 11/17/2019 CLINICAL DATA:  Pneumonia and sepsis.  Evaluate source of infection. EXAM: CT CHEST, ABDOMEN AND PELVIS WITHOUT CONTRAST TECHNIQUE: Multidetector CT imaging of the chest, abdomen and pelvis was performed following the standard protocol without IV contrast. COMPARISON:  None. FINDINGS: CT CHEST FINDINGS Cardiovascular: Heart size upper limits of normal. No pericardial effusion. Aortic atherosclerosis. Mediastinum/Nodes: Normal appearance of the thyroid gland. Tracheostomy tube in place with tip above carina. Normal appearance of the esophagus. No supraclavicular, axillary, mediastinal, or hilar adenopathy. Lungs/Pleura: No pleural effusion. There is diffuse ground-glass attenuation identified throughout both lungs. Bilateral, upper and lower lung zone areas of peripheral predominant interstitial reticulation and airspace opacification is identified. Mild diffuse bronchiectasis noted within the upper and lower lung zones. Imaging findings are compatible with the clinical history COVID pneumonia. No lobar consolidation identified or evidence of pulmonary abscess. Musculoskeletal: No chest wall mass or suspicious bone lesions identified. There is subcutaneous soft tissue stranding noted within the ventral abdominal wall at the level of the distal body of sternum and xiphoid process. CT  ABDOMEN PELVIS FINDINGS Hepatobiliary: No focal liver abnormality is seen. No gallstones, gallbladder wall thickening, or biliary dilatation. Pancreas: Unremarkable. No pancreatic ductal dilatation or surrounding inflammatory changes. Spleen: Normal in size without focal abnormality. Adrenals/Urinary Tract: Normal appearance of the adrenal glands. No kidney mass or hydronephrosis identified. The urinary bladder is collapsed around a Foley catheter balloon. Stomach/Bowel: Gastrostomy tube is noted within the stomach. The stomach is nondistended. The appendix is not visualized. No bowel wall thickening, inflammation or distension identified. Rectal tube in place. Vascular/Lymphatic: Aortic atherosclerosis. No aneurysm. No abdominopelvic adenopathy identified. Reproductive: Status post hysterectomy. No adnexal masses. Other: There is no free fluid or fluid collection. Musculoskeletal: Previous posterior hardware fixation of L3 and L4. Solid fusion of L3-4 and L4-5 disc space noted. Degenerative disc disease noted at T12-L1 and L1-2. No signs of discitis or osteomyelitis IMPRESSION: 1. Imaging findings within the chest are compatible with the clinical history of COVID pneumonia. No lobar consolidation, signs of empyema or evidence of pulmonary abscess. 2. No acute findings identified within the abdomen or pelvis. 3. Aortic atherosclerosis. 4. Soft tissue stranding within the ventral abdominal wall at the level of the distal body of sternum and xiphoid process. This is nonspecific and may be related to patient lying prone for extended periods of time. Alternatively focal area of cellulitis may have this appearance. Recommend correlation with physical exam findings. Aortic Atherosclerosis (ICD10-I70.0). Electronically Signed   By: Kerby Moors M.D.   On: 11/17/2019 10:38     Medications:       Assessment/ Plan:  69 y.o. 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  1.  Acute kidney injury/chronic kidney disease stage IIIa.    Dialysis catheter not yet taken out.  To be taken out today.  Continue to monitor off of dialysis.  Azotemia being monitored.  Lasix drip to be started.  2.  Acute respiratory failure secondary to COVID-19 infection.    CT chest reviewed.  Findings compatible with prior Covid infection.  3.  Anemia of chronic kidney disease.    Hemoglobin down to 8.2.  Continue to monitor.  4.  Secondary hyperparathyroidism.  Phosphorus down a bit to 6.6.  Continue to monitor for now.  5.  Bacteremia.  1 or 2 culture shows Staph epidermidis.  Dialysis catheter to be taken out.  Could potentially be a contaminant.    LOS: 0 Terrion Gencarelli 11/5/20218:09 AM

## 2019-11-18 NOTE — Consult Note (Signed)
Infectious Disease Consultation   Veronica Melton  DUK:[id number]  DOB: 03-Aug-1950  DOA: 11/05/2019  Requesting physician: Dr. Laren Everts  Reason for consultation: Antibiotic recommendations   History of Present Illness: Veronica Melton is an 69 y.o. female with medical history significant for chronic kidney disease, diabetes mellitus type 2 who was admitted to the outside hospital with worsening shortness of breath.  She was diagnosed with COVID-19 infection and pneumonia.  She received treatment with IV remdesivir, dexamethasone.  Patient also was found to have elevated procalcitonin with leukocytosis at the time of admission.  She was treated with empiric IV antibiotics.  She initially required continuous BiPAP due to respiratory decompensation.  Unfortunately she had worsening respiratory failure and had to be intubated.  Patient's hospital course complicated by worsening renal failure.  Nephrology was consulted.  She required hemodialysis, also required Levophed for hypotension.  She had episodes of fever.  She was treated with Diflucan because the cultures were growing yeast.  She remained on mechanical ventilation.  She received trach and PEG tube placement on 2019-10-27.  She started having fevers again and she was started on vancomycin, Zosyn for possible pneumonia.  Infectious disease was consulted and antibiotics were deescalated to Zosyn.  Patient remained encephalopathic. CT head on 2019-11-01 showed chronic small vessel changes.  Due to her complex medical problems she was transferred to [facility].  She had blood cultures from 2019-11-12 showing staph epidermidis.  She currently has a trach in place, on the vent.  Sedated.   Review of Systems:  Unable to obtain review of systems at this time.  Past Medical History: Past Medical History:  Diagnosis Date  . Acute on chronic respiratory failure with hypoxia ([place])   . COVID-19 virus infection   . End stage renal  disease on dialysis ([place])   . Pneumonia due to COVID-19 virus   . Type 2 diabetes mellitus with stage 3a chronic kidney disease, without long-term current use of insulin ([place])  . Hypercholesterolemia  . Plantar fasciitis, bilateral  . B12 deficiency  . Recurrent oral herpes simplex  . Chronic bilateral low back pain with left-sided sciatica  . Restless legs syndrome  . Gastroesophageal reflux disease without esophagitis  . Allergic conjunctivitis of both eyes  . Primary insomnia  . Primary osteoarthritis involving multiple joints  . NAFLD (nonalcoholic fatty liver disease)  . GAD (generalized anxiety disorder)  . Recurrent nephrolithiasis  . Obesity (BMI 30.0-34.9)  . Irritable bowel syndrome without diarrhea  . Essential hypertension  . History of colonic polyps  . Sensation of feeling hot  . History of dysphagia  . History of shortness of breath  . Chronic pain of right knee   Past Surgical History: Tracheostomy, PEG tube placement  Allergies: Contrast, lisinopril, diclofenac, gabapentin  Social History: Negative for alcohol, tobacco or recreational drug abuse  Family History: History of hypertension  Physical Exam: Vitals: Temperature 100.1, pulse 104, respiratory rate 35, blood pressure 126/52, pulse oximetry 95% on 40% FiO2 with a PEEP of 6 Constitutional: Ill-appearing female, intubated, sedated Head: Atraumatic, normocephalic Eyes: PERLA ENMT: external ears and nose appear normal, Lips appears normal, moist oral mucosa  Neck: Has trach in place CVS: S1-S2 Respiratory: Coarse breath sounds, rhonchi Abdomen: Mildly distended, positive bowel sounds, PEG tube in place Musculoskeletal: Edema Neuro: Patient intubated, sedated, not following commands at this time.  Unable to do a neurologic exam. Psych: Unable to assess   Data  reviewed:  I have personally reviewed following labs and imaging studies Labs:  CBC: Recent Labs  Lab 11/13/19 0426 11/14/19 0551  11/16/19 0431 11/17/19 1211 11/18/19 0508  WBC 5.6 7.2 9.0 4.9 3.8*  HGB 9.7* 9.6* 9.6* 8.9* 8.2*  HCT 31.9* 31.9* 32.6* 30.2* 27.3*  MCV 88.4 89.6 90.1 90.4 91.6  PLT 293 250 175 144* 133*    Basic Metabolic Panel: Recent Labs  Lab 11/13/19 0426 11/13/19 0426 11/14/19 0551 11/14/19 0551 11/16/19 0431 11/16/19 0431 11/17/19 0422 11/17/19 1211 11/18/19 0508  NA 137  --  140  --  141  --  142  --  140  K 3.7   < > 3.5   < > 3.3*   < > 4.3  --  4.6  CL 93*  --  96*  --  96*  --  98  --  96*  CO2 25  --  27  --  28  --  28  --  28  GLUCOSE 159*  --  185*  --  215*  --  189*  --  191*  BUN 129*  --  139*  --  176*  --  170*  --  175*  CREATININE 3.79*  --  3.55*  --  3.42*  --  3.33*  --  3.31*  CALCIUM 10.2  --  10.3  --  10.0  --  9.6  --  9.1  MG 2.3  --  2.3  --  2.2  --   --  2.5* 2.5*  PHOS 7.7*  --  7.8*  --  7.8*  --   --   --  6.6*   < > = values in this interval not displayed.   GFR CrCl cannot be calculated (Unknown ideal weight.). Liver Function Tests: Recent Labs  Lab 11/13/19 0426 11/14/19 0551 11/16/19 0431 11/18/19 0508  ALBUMIN 3.3* 3.3* 3.1* 3.0*   No results for input(s): LIPASE, AMYLASE in the last 168 hours. No results for input(s): AMMONIA in the last 168 hours. Coagulation profile No results for input(s): INR, PROTIME in the last 168 hours.  Cardiac Enzymes: No results for input(s): CKTOTAL, CKMB, CKMBINDEX, TROPONINI in the last 168 hours. BNP: Invalid input(s): POCBNP CBG: No results for input(s): GLUCAP in the last 168 hours. D-Dimer No results for input(s): DDIMER in the last 72 hours. Hgb A1c No results for input(s): HGBA1C in the last 72 hours. Lipid Profile Recent Labs    11/18/19 0508  TRIG 383*   Thyroid function studies No results for input(s): TSH, T4TOTAL, T3FREE, THYROIDAB in the last 72 hours.  Invalid input(s): FREET3 Anemia work up Recent Labs    11/18/19 0508  FERRITIN 1,525*  TIBC 274  IRON 153    Urinalysis    Component Value Date/Time   COLORURINE AMBER (A) 11/10/2019 1336   APPEARANCEUR TURBID (A) 11/10/2019 1336   LABSPEC 1.012 11/10/2019 1336   PHURINE 5.0 11/10/2019 1336   GLUCOSEU NEGATIVE 11/10/2019 1336   HGBUR SMALL (A) 11/10/2019 1336   BILIRUBINUR NEGATIVE 11/10/2019 [place] 11/10/2019 1336   PROTEINUR 100 (A) 11/10/2019 1336   NITRITE NEGATIVE 11/10/2019 1336   LEUKOCYTESUR MODERATE (A) 11/10/2019 1336     Microbiology Recent Results (from the past 240 hour(s))  Urine Culture     Status: Abnormal   Collection Time: 11/10/19  1:36 PM   Specimen: Urine, Random  Result Value Ref Range Status   Specimen Description URINE, RANDOM  Final   Special Requests   Final    NONE Performed at [facility], [place] [address], [place], [place] [address]    Culture 60,000 COLONIES/mL YEAST (A)  Final   Report Status 11/11/2019 FINAL  Final  Culture, blood (routine x 2)     Status: None   Collection Time: 11/12/19 10:00 PM   Specimen: BLOOD  Result Value Ref Range Status   Specimen Description BLOOD SITE NOT SPECIFIED  Final   Special Requests   Final    BOTTLES DRAWN AEROBIC AND ANAEROBIC Blood Culture results may not be optimal due to an excessive volume of blood received in culture bottles   Culture   Final    NO GROWTH 5 DAYS Performed at [facility], [place] [address] [place], [place] [address]    Report Status 11/17/2019 FINAL  Final  Culture, blood (routine x 2)     Status: Abnormal   Collection Time: 11/12/19 10:06 PM   Specimen: BLOOD  Result Value Ref Range Status   Specimen Description BLOOD SITE NOT SPECIFIED  Final   Special Requests   Final    BOTTLES DRAWN AEROBIC AND ANAEROBIC Blood Culture results may not be optimal due to an excessive volume of blood received in culture bottles   Culture  Setup Time   Final    GRAM POSITIVE COCCI ANAEROBIC BOTTLE ONLY CRITICAL RESULT CALLED TO, READ BACK BY AND VERIFIED WITH: RN J  ALGHALI @0058  10/14/19 BY S GEZAHEGN  Performed at [facility], [place] [address] [place], [place] [address]    Culture STAPHYLOCOCCUS EPIDERMIDIS (A)  Final   Report Status 11/16/2019 FINAL  Final   Organism ID, Bacteria STAPHYLOCOCCUS EPIDERMIDIS  Final      Susceptibility   Staphylococcus epidermidis - MIC*    CIPROFLOXACIN >=8 RESISTANT Resistant     ERYTHROMYCIN >=8 RESISTANT Resistant     GENTAMICIN 8 INTERMEDIATE Intermediate     OXACILLIN >=4 RESISTANT Resistant     TETRACYCLINE 2 SENSITIVE Sensitive     VANCOMYCIN 1 SENSITIVE Sensitive     TRIMETH/SULFA 80 RESISTANT Resistant     CLINDAMYCIN >=8 RESISTANT Resistant     RIFAMPIN <=0.5 SENSITIVE Sensitive     Inducible Clindamycin NEGATIVE Sensitive     * STAPHYLOCOCCUS EPIDERMIDIS  Blood Culture ID Panel (Reflexed)     Status: Abnormal   Collection Time: 11/12/19 10:06 PM  Result Value Ref Range Status   Enterococcus faecalis NOT DETECTED NOT DETECTED Final   Enterococcus Faecium NOT DETECTED NOT DETECTED Final   Listeria monocytogenes NOT DETECTED NOT DETECTED Final   Staphylococcus species DETECTED (A) NOT DETECTED Final    Comment: CRITICAL RESULT CALLED TO, READ BACK BY AND VERIFIED WITH: RN J ALGHALI @0058  10/14/19 BY S GEZAHEGN     Staphylococcus aureus (BCID) NOT DETECTED NOT DETECTED Final   Staphylococcus epidermidis DETECTED (A) NOT DETECTED Final    Comment: Methicillin (oxacillin) resistant coagulase negative staphylococcus. Possible blood culture contaminant (unless isolated from more than one blood culture draw or clinical case suggests pathogenicity). No antibiotic treatment is indicated for blood  culture contaminants. CRITICAL RESULT CALLED TO, READ BACK BY AND VERIFIED WITH: RN J ALGHALI @0058  10/14/19 BY S GEZAHEGN     Staphylococcus lugdunensis NOT DETECTED NOT DETECTED Final   Streptococcus species NOT DETECTED NOT DETECTED Final   Streptococcus agalactiae NOT DETECTED NOT DETECTED Final    Streptococcus pneumoniae NOT DETECTED NOT DETECTED Final   Streptococcus pyogenes  NOT DETECTED NOT DETECTED Final   A.calcoaceticus-baumannii NOT DETECTED NOT DETECTED Final   Bacteroides fragilis NOT DETECTED NOT DETECTED Final   Enterobacterales NOT DETECTED NOT DETECTED Final   Enterobacter cloacae complex NOT DETECTED NOT DETECTED Final   Escherichia coli NOT DETECTED NOT DETECTED Final   Klebsiella aerogenes NOT DETECTED NOT DETECTED Final   Klebsiella oxytoca NOT DETECTED NOT DETECTED Final   Klebsiella pneumoniae NOT DETECTED NOT DETECTED Final   Proteus species NOT DETECTED NOT DETECTED Final   Salmonella species NOT DETECTED NOT DETECTED Final   Serratia marcescens NOT DETECTED NOT DETECTED Final   Haemophilus influenzae NOT DETECTED NOT DETECTED Final   Neisseria meningitidis NOT DETECTED NOT DETECTED Final   Pseudomonas aeruginosa NOT DETECTED NOT DETECTED Final   Stenotrophomonas maltophilia NOT DETECTED NOT DETECTED Final   Candida albicans NOT DETECTED NOT DETECTED Final   Candida auris NOT DETECTED NOT DETECTED Final   Candida glabrata NOT DETECTED NOT DETECTED Final   Candida krusei NOT DETECTED NOT DETECTED Final   Candida parapsilosis NOT DETECTED NOT DETECTED Final   Candida tropicalis NOT DETECTED NOT DETECTED Final   Cryptococcus neoformans/gattii NOT DETECTED NOT DETECTED Final   Methicillin resistance mecA/C DETECTED (A) NOT DETECTED Final    Comment: CRITICAL RESULT CALLED TO, READ BACK BY AND VERIFIED WITH: RN J ALGHALI @0058  10/14/19 BY S GEZAHEGN  Performed at [facility], [address], [place], [place] [address]   Culture, respiratory (non-expectorated)     Status: None   Collection Time: 11/12/19 10:23 PM   Specimen: Tracheal Aspirate; Respiratory  Result Value Ref Range Status   Specimen Description TRACHEAL ASPIRATE  Final   Special Requests NONE  Final   Gram Stain   Final    RARE WBC PRESENT,BOTH PMN AND MONONUCLEAR NO ORGANISMS  SEEN Performed at [facility], [address] [place], [place] [address]    Culture FEW CANDIDA ALBICANS  Final   Report Status 11/15/2019 FINAL  Final  Culture, blood (routine x 2)     Status: None (Preliminary result)   Collection Time: 11/15/19  3:24 PM   Specimen: BLOOD RIGHT HAND  Result Value Ref Range Status   Specimen Description BLOOD RIGHT HAND  Final   Special Requests   Final    BOTTLES DRAWN AEROBIC AND ANAEROBIC Blood Culture adequate volume   Culture   Final    NO GROWTH 3 DAYS Performed at [facility], [place] [address] [place], [place] [address]    Report Status PENDING  Incomplete  Culture, blood (routine x 2)     Status: None (Preliminary result)   Collection Time: 11/15/19  3:27 PM   Specimen: BLOOD LEFT HAND  Result Value Ref Range Status   Specimen Description BLOOD LEFT HAND  Final   Special Requests   Final    BOTTLES DRAWN AEROBIC AND ANAEROBIC Blood Culture adequate volume   Culture   Final    NO GROWTH 3 DAYS Performed at [facility], [place] [address], [place], [place] [address]    Report Status PENDING  Incomplete   Inpatient Medications:   Please see MAR   Radiological Exams on Admission: CT HEAD WO CONTRAST  Result Date: 11/17/2019 CLINICAL DATA:  Mental status change, unknown cause. Pneumonia and sepsis. EXAM: CT HEAD WITHOUT CONTRAST TECHNIQUE: Contiguous axial images were obtained from the base of the skull through the vertex without intravenous contrast. COMPARISON:  No pertinent prior exams are available  for comparison. FINDINGS: Brain: Mildly motion degraded examination. Mild generalized cerebral atrophy. Mild ill-defined hypoattenuation within the cerebral white matter is nonspecific, but compatible with chronic small vessel ischemic disease. There is no acute intracranial hemorrhage. No demarcated cortical infarct. No extra-axial fluid collection. No evidence of intracranial mass. No midline shift. Vascular: No hyperdense  vessel.  Atherosclerotic calcifications. Skull: Normal. Negative for fracture or focal suspicious lesion. Sinuses/Orbits: Visualized orbits show no acute finding. Mild left sphenoid sinus mucosal thickening. Other: Bilateral mastoid effusions. IMPRESSION: Mildly motion degraded examination. No evidence of acute intracranial abnormality. Mild cerebral atrophy and chronic small vessel ischemic disease. Mild left sphenoid sinus mucosal thickening. Bilateral mastoid effusions. Electronically Signed   By: Kellie Simmering DO   On: 11/17/2019 10:24   DG Chest Port 1 View  Result Date: 11/17/2019 CLINICAL DATA:  Pneumonia. EXAM: PORTABLE CHEST 1 VIEW COMPARISON:  11/14/2019. FINDINGS: Tracheostomy tube and right central lines in stable position. Heart size stable. Diffuse bilateral interstitial infiltrates/edema again noted without interim change. No pleural effusion or pneumothorax. IMPRESSION: 1. Tracheostomy tube and right central lines in stable position. 2. Diffuse bilateral interstitial infiltrates/edema again noted without interim change. Electronically Signed   By: Marcello Moores  Register   On: 11/17/2019 05:42   CT CHEST ABDOMEN PELVIS WO CONTRAST  Result Date: 11/17/2019 CLINICAL DATA:  Pneumonia and sepsis.  Evaluate source of infection. EXAM: CT CHEST, ABDOMEN AND PELVIS WITHOUT CONTRAST TECHNIQUE: Multidetector CT imaging of the chest, abdomen and pelvis was performed following the standard protocol without IV contrast. COMPARISON:  None. FINDINGS: CT CHEST FINDINGS Cardiovascular: Heart size upper limits of normal. No pericardial effusion. Aortic atherosclerosis. Mediastinum/Nodes: Normal appearance of the thyroid gland. Tracheostomy tube in place with tip above carina. Normal appearance of the esophagus. No supraclavicular, axillary, mediastinal, or hilar adenopathy. Lungs/Pleura: No pleural effusion. There is diffuse ground-glass attenuation identified throughout both lungs. Bilateral, upper and lower lung  zone areas of peripheral predominant interstitial reticulation and airspace opacification is identified. Mild diffuse bronchiectasis noted within the upper and lower lung zones. Imaging findings are compatible with the clinical history COVID pneumonia. No lobar consolidation identified or evidence of pulmonary abscess. Musculoskeletal: No chest wall mass or suspicious bone lesions identified. There is subcutaneous soft tissue stranding noted within the ventral abdominal wall at the level of the distal body of sternum and xiphoid process. CT ABDOMEN PELVIS FINDINGS Hepatobiliary: No focal liver abnormality is seen. No gallstones, gallbladder wall thickening, or biliary dilatation. Pancreas: Unremarkable. No pancreatic ductal dilatation or surrounding inflammatory changes. Spleen: Normal in size without focal abnormality. Adrenals/Urinary Tract: Normal appearance of the adrenal glands. No kidney mass or hydronephrosis identified. The urinary bladder is collapsed around a Foley catheter balloon. Stomach/Bowel: Gastrostomy tube is noted within the stomach. The stomach is nondistended. The appendix is not visualized. No bowel wall thickening, inflammation or distension identified. Rectal tube in place. Vascular/Lymphatic: Aortic atherosclerosis. No aneurysm. No abdominopelvic adenopathy identified. Reproductive: Status post hysterectomy. No adnexal masses. Other: There is no free fluid or fluid collection. Musculoskeletal: Previous posterior hardware fixation of L3 and L4. Solid fusion of L3-4 and L4-5 disc space noted. Degenerative disc disease noted at T12-L1 and L1-2. No signs of discitis or osteomyelitis IMPRESSION: 1. Imaging findings within the chest are compatible with the clinical history of COVID pneumonia. No lobar consolidation, signs of empyema or evidence of pulmonary abscess. 2. No acute findings identified within the abdomen or pelvis. 3. Aortic atherosclerosis. 4. Soft tissue stranding within the ventral  abdominal wall  at the level of the distal body of sternum and xiphoid process. This is nonspecific and may be related to patient lying prone for extended periods of time. Alternatively focal area of cellulitis may have this appearance. Recommend correlation with physical exam findings. Aortic Atherosclerosis (ICD10-I70.0). Electronically Signed   By: Kerby Moors M.D.   On: 11/17/2019 10:38    Impression/Recommendations Active Problems:   Acute on chronic respiratory failure with hypoxia, ventilator dependent Bacteremia with Staphylococcus    COVID-19 virus infection   Pneumonia ARDS Acute on chronic renal failure on dialysis  Pancytopenia UTI Encephalopathy Diabetes mellitus type 2 Dysphagia/protein calorie malnutrition  Acute on chronic respiratory failure: Patient remains on the ventilator.  She is on 40% FiO2 with PEEP of 6.  Likely multifactorial etiology.  She had COVID-19 infection for which she was treated with remdesivir, Decadron.  She also developed secondary pneumonia and was treated with broad-spectrum antimicrobials.  Previous respiratory cultures from here showed yeast.  She received treatment with fluconazole as well.  There is also suspicion for ARDS secondary to COVID-19 infection.  Pulmonary following.  Current supportive management.  She is currently on treatment with Zyvox for the gram-positive bacteremia with Staphylococcus.  She got treatment with Ancef prior.  If her respiratory status is not improving consider repeat imaging preferably chest CT to better evaluate and repeat respiratory cultures.  Bacteremia: Patient had blood cultures collected on 11/12/2019 that showed staph epidermidis in 1 of 4.  She has a dialysis catheter and a CVL.  She was previously on dialysis.  Now making good urine therefore plan to remove the dialysis catheter.  Unfortunately we are unable to use IV Vancomycin here due to lack of resources to check the Vanco trough in a timely manner.   Because of high risk for Vanco toxicity she was initially initiated on Ancef for now switched to Zyvox.  Fortunately repeat blood cultures from 11/15/2019 did not show any growth in 3 days.  Tentative plan is for the Zyvox to be continued on 11/29/2019.  However, on lab work patient noted to be pancytopenic.  She is starting to have leukopenia and thrombocytopenia.  Exact etiology unclear at this time.  Continue to monitor counts.  If she is continuing to have worsening pancytopenia/thrombocytopenia we could possibly consider stopping the Zyvox earlier.  COVID-19 infection: Patient was treated for COVID-19 infection at the outside facility.  Here she received hydroxyurea.  Unfortunately remains on the ventilator.  There is also concern for ARDS from the COVID-19 infection.  Pulmonary following.  Antibiotics as mentioned above.  Continue to monitor closely.  Pneumonia: Patient was found to have healthcare associated pneumonia.  She had respiratory cultures from here that showed a few Candida albicans.  She was initially treated with broad-spectrum antimicrobial treatment with IV vancomycin, Zosyn and also treated with fluconazole.  Now on Zyvox for above-mentioned reasons.  She also has dysphagia therefore high risk for aspiration and recurrent aspiration pneumonia.  Acute on chronic renal failure: Patient was on dialysis.  Now making good urine per primary despite high BUN/creatinine.  Plan to remove the dialysis catheter.  Pancytopenia: On lab work patient noted to be pancytopenic with leukopenia, thrombocytopenia, only since 11/16/2019 with an acute drop from 2 50-1 75 which is unusual to attribute to the antibiotics.  Exact etiology unclear at this time. Continue to monitor counts closely.  She is currently on Zyvox with a tentative end date of 11/29/2019.  However, the counts continue to drop then we  may need to stop the Zyvox earlier than intended.  Fortunately repeat blood cultures do not show any  growth.  UTI: She had urine cultures that showed yeast.  She already received treatment with fluconazole.  Diabetes mellitus type 2: Continue to monitor Accu-Cheks, medications and management of diabetes per primary team.  Encephalopathy: She has been started on acyclovir for probable viral encephalitis with a tentative stop date of 11/25/2019. Continue supportive management per primary team.    Dysphagia/protein calorie malnutrition: Unfortunately due to her dysphagia she is at risk for aspiration and aspiration pneumonia.  Management per primary team.  Thank you for this consultation.   Unfortunately due to her complex medical problems she is very high risk for worsening and decompensation.  Plan of care discussed with the primary team and pharmacy.  Yaakov Guthrie M.D. 11/18/2019, 4:18 PM

## 2019-11-18 NOTE — Progress Notes (Signed)
Pulmonary Critical Care Medicine [place]   PULMONARY CRITICAL CARE SERVICE  PROGRESS NOTE  Date of Service: 11/18/2019  Veronica Melton  YYT:[id number]  DOB: [birth year]   DOA: 11/05/2019  Referring Physician: Merton Border, MD  HPI: Veronica Melton is a 69 y.o. female seen for follow up of Acute on Chronic Respiratory Failure. Patient is on pressure control mode currently on 40% FiO2 with a PEEP of 6  Medications: Reviewed on Rounds  Physical Exam:  Vitals: Temperature is 96.2 pulse 67 respiratory 14 blood pressure is 89/42 saturations 95%  Ventilator Settings on pressure assist control FiO2 40% tidal volume is 427 PEEP 6  . General: Comfortable at this time . Eyes: Grossly normal lids, irises & conjunctiva . ENT: grossly tongue is normal . Neck: no obvious mass . Cardiovascular: S1 S2 normal no gallop . Respiratory: No rhonchi very coarse breath sounds . Abdomen: soft . Skin: no rash seen on limited exam . Musculoskeletal: not rigid . Psychiatric:unable to assess . Neurologic: no seizure no involuntary movements         Lab Data:   Basic Metabolic Panel: Recent Labs  Lab 11/13/19 0426 11/14/19 0551 11/16/19 0431 11/17/19 0422 11/17/19 1211 11/18/19 0508  NA 137 140 141 142  --  140  K 3.7 3.5 3.3* 4.3  --  4.6  CL 93* 96* 96* 98  --  96*  CO2 25 27 28 28   --  28  GLUCOSE 159* 185* 215* 189*  --  191*  BUN 129* 139* 176* 170*  --  175*  CREATININE 3.79* 3.55* 3.42* 3.33*  --  3.31*  CALCIUM 10.2 10.3 10.0 9.6  --  9.1  MG 2.3 2.3 2.2  --  2.5* 2.5*  PHOS 7.7* 7.8* 7.8*  --   --  6.6*    ABG: Recent Labs  Lab 11/13/19 0010 11/16/19 0137 11/17/19 0800 11/17/19 1530 11/18/19 0458  PHART 7.327* 7.334* 7.397 7.306* 7.324*  PCO2ART 55.6* 54.3* 49.9* 58.6* 58.0*  PO2ART 126* 86.0 57.8* 170* 86.6  HCO3 28.3* 28.1* 30.1* 28.0 29.4*  O2SAT 98.2 96.0 89.3 99.0 96.0    Liver Function Tests: Recent Labs  Lab 11/13/19 0426 11/14/19 0551  11/16/19 0431 11/18/19 0508  ALBUMIN 3.3* 3.3* 3.1* 3.0*   No results for input(s): LIPASE, AMYLASE in the last 168 hours. No results for input(s): AMMONIA in the last 168 hours.  CBC: Recent Labs  Lab 11/13/19 0426 11/14/19 0551 11/16/19 0431 11/17/19 1211 11/18/19 0508  WBC 5.6 7.2 9.0 4.9 3.8*  HGB 9.7* 9.6* 9.6* 8.9* 8.2*  HCT 31.9* 31.9* 32.6* 30.2* 27.3*  MCV 88.4 89.6 90.1 90.4 91.6  PLT 293 250 175 144* 133*    Cardiac Enzymes: No results for input(s): CKTOTAL, CKMB, CKMBINDEX, TROPONINI in the last 168 hours.  BNP (last 3 results) No results for input(s): BNP in the last 8760 hours.  ProBNP (last 3 results) No results for input(s): PROBNP in the last 8760 hours.  Radiological Exams: CT HEAD WO CONTRAST  Result Date: 11/17/2019 CLINICAL DATA:  Mental status change, unknown cause. Pneumonia and sepsis. EXAM: CT HEAD WITHOUT CONTRAST TECHNIQUE: Contiguous axial images were obtained from the base of the skull through the vertex without intravenous contrast. COMPARISON:  No pertinent prior exams are available for comparison. FINDINGS: Brain: Mildly motion degraded examination. Mild generalized cerebral atrophy. Mild ill-defined hypoattenuation within the cerebral white matter is nonspecific, but compatible with chronic small vessel ischemic disease. There is no acute intracranial  hemorrhage. No demarcated cortical infarct. No extra-axial fluid collection. No evidence of intracranial mass. No midline shift. Vascular: No hyperdense vessel.  Atherosclerotic calcifications. Skull: Normal. Negative for fracture or focal suspicious lesion. Sinuses/Orbits: Visualized orbits show no acute finding. Mild left sphenoid sinus mucosal thickening. Other: Bilateral mastoid effusions. IMPRESSION: Mildly motion degraded examination. No evidence of acute intracranial abnormality. Mild cerebral atrophy and chronic small vessel ischemic disease. Mild left sphenoid sinus mucosal thickening.  Bilateral mastoid effusions. Electronically Signed   By: Kellie Simmering DO   On: 11/17/2019 10:24   DG Chest Port 1 View  Result Date: 11/17/2019 CLINICAL DATA:  Pneumonia. EXAM: PORTABLE CHEST 1 VIEW COMPARISON:  11/14/2019. FINDINGS: Tracheostomy tube and right central lines in stable position. Heart size stable. Diffuse bilateral interstitial infiltrates/edema again noted without interim change. No pleural effusion or pneumothorax. IMPRESSION: 1. Tracheostomy tube and right central lines in stable position. 2. Diffuse bilateral interstitial infiltrates/edema again noted without interim change. Electronically Signed   By: Marcello Moores  Register   On: 11/17/2019 05:42   ECHOCARDIOGRAM COMPLETE  Result Date: 11/17/2019    ECHOCARDIOGRAM REPORT   Patient Name:   Veronica Melton Date of Exam: 11/16/2019 Medical Rec #:  [id number]     Height: Accession #:    [id number]    Weight: Date of Birth:  14-Feb-1950     BSA: Patient Age:    56 years      BP:           116/51 mmHg Patient Gender: F             HR:           98 bpm. Exam Location:  Inpatient Procedure: 2D Echo, Cardiac Doppler and Color Doppler Indications:     Bacteremia 790.7 / R78.81  History:         Patient has no prior history of Echocardiogram examinations.                  Risk Factors:Diabetes and Dyslipidemia. GERD.  Sonographer:     Vickie Epley RDCS Referring Phys:  [place] Diagnosing Phys: Dixie Dials MD  Sonographer Comments: Echo performed with patient supine and on artificial respirator. IMPRESSIONS  1. Left ventricular ejection fraction, by estimation, is 55 to 60%. The left ventricle has normal function. The left ventricle has no regional wall motion abnormalities. Left ventricular diastolic parameters are consistent with Grade I diastolic dysfunction (impaired relaxation).  2. Right ventricular systolic function is normal. The right ventricular size is normal.  3. The mitral valve is degenerative. Trivial mitral valve regurgitation.  4.  The aortic valve is tricuspid. There is mild calcification of the aortic valve. There is mild thickening of the aortic valve. Aortic valve regurgitation is not visualized. Mild aortic valve sclerosis is present, with no evidence of aortic valve stenosis.  5. The inferior vena cava is normal in size with greater than 50% respiratory variability, suggesting right atrial pressure of 3 mmHg. Conclusion(s)/Recommendation(s): No evidence of valvular vegetations on this transthoracic echocardiogram. Would recommend a transesophageal echocardiogram to exclude infective endocarditis if clinically indicated. FINDINGS  Left Ventricle: Left ventricular ejection fraction, by estimation, is 55 to 60%. The left ventricle has normal function. The left ventricle has no regional wall motion abnormalities. The left ventricular internal cavity size was normal in size. There is  no left ventricular hypertrophy. Left ventricular diastolic parameters are consistent with Grade I diastolic dysfunction (impaired relaxation). Right Ventricle: The right ventricular size is normal.  No increase in right ventricular wall thickness. Right ventricular systolic function is normal. Left Atrium: Left atrial size was normal in size. Right Atrium: Right atrial size was normal in size. Pericardium: There is no evidence of pericardial effusion. Mitral Valve: The mitral valve is degenerative in appearance. There is mild thickening of the mitral valve leaflet(s). There is mild calcification of the mitral valve leaflet(s). Trivial mitral valve regurgitation. Tricuspid Valve: The tricuspid valve is normal in structure. Tricuspid valve regurgitation is trivial. Aortic Valve: The aortic valve is tricuspid. There is mild calcification of the aortic valve. There is mild thickening of the aortic valve. Aortic valve regurgitation is not visualized. Mild aortic valve sclerosis is present, with no evidence of aortic valve stenosis. Pulmonic Valve: The pulmonic valve  was normal in structure. Pulmonic valve regurgitation is not visualized. Aorta: The aortic root is normal in size and structure. Venous: The inferior vena cava is normal in size with greater than 50% respiratory variability, suggesting right atrial pressure of 3 mmHg. IAS/Shunts: The interatrial septum was not assessed.  LEFT VENTRICLE PLAX 2D LVIDd:         4.30 cm     Diastology LVIDs:         3.00 cm     LV e' medial:    6.85 cm/s LV PW:         1.00 cm     LV E/e' medial:  15.5 LV IVS:        1.00 cm     LV e' lateral:   6.96 cm/s LVOT diam:     2.00 cm     LV E/e' lateral: 15.2 LV SV:         64 LVOT Area:     3.14 cm  LV Volumes (MOD) LV vol d, MOD A2C: 76.3 ml LV vol d, MOD A4C: 78.5 ml LV vol s, MOD A2C: 33.4 ml LV vol s, MOD A4C: 45.2 ml LV SV MOD A2C:     42.9 ml LV SV MOD A4C:     78.5 ml LV SV MOD BP:      40.5 ml RIGHT VENTRICLE RV S prime:     15.10 cm/s TAPSE (M-mode): 1.8 cm LEFT ATRIUM             RIGHT ATRIUM LA diam:        4.20 cm RA Area:     11.60 cm LA Vol (A2C):   26.6 ml RA Volume:   21.60 ml LA Vol (A4C):   34.2 ml LA Biplane Vol: 30.3 ml  AORTIC VALVE LVOT Vmax:   108.00 cm/s LVOT Vmean:  78.400 cm/s LVOT VTI:    0.203 m  AORTA Ao Root diam: 3.00 cm MITRAL VALVE MV Area (PHT): 3.65 cm     SHUNTS MV Decel Time: 208 msec     Systemic VTI:  0.20 m MV E velocity: 106.00 cm/s  Systemic Diam: 2.00 cm MV A velocity: 115.00 cm/s MV E/A ratio:  0.92 Dixie Dials MD Electronically signed by Dixie Dials MD Signature Date/Time: 11/17/2019/12:03:47 AM    Final    CT CHEST ABDOMEN PELVIS WO CONTRAST  Result Date: 11/17/2019 CLINICAL DATA:  Pneumonia and sepsis.  Evaluate source of infection. EXAM: CT CHEST, ABDOMEN AND PELVIS WITHOUT CONTRAST TECHNIQUE: Multidetector CT imaging of the chest, abdomen and pelvis was performed following the standard protocol without IV contrast. COMPARISON:  None. FINDINGS: CT CHEST FINDINGS Cardiovascular: Heart size upper limits of normal. No pericardial effusion.  Aortic atherosclerosis. Mediastinum/Nodes: Normal appearance of the thyroid gland. Tracheostomy tube in place with tip above carina. Normal appearance of the esophagus. No supraclavicular, axillary, mediastinal, or hilar adenopathy. Lungs/Pleura: No pleural effusion. There is diffuse ground-glass attenuation identified throughout both lungs. Bilateral, upper and lower lung zone areas of peripheral predominant interstitial reticulation and airspace opacification is identified. Mild diffuse bronchiectasis noted within the upper and lower lung zones. Imaging findings are compatible with the clinical history COVID pneumonia. No lobar consolidation identified or evidence of pulmonary abscess. Musculoskeletal: No chest wall mass or suspicious bone lesions identified. There is subcutaneous soft tissue stranding noted within the ventral abdominal wall at the level of the distal body of sternum and xiphoid process. CT ABDOMEN PELVIS FINDINGS Hepatobiliary: No focal liver abnormality is seen. No gallstones, gallbladder wall thickening, or biliary dilatation. Pancreas: Unremarkable. No pancreatic ductal dilatation or surrounding inflammatory changes. Spleen: Normal in size without focal abnormality. Adrenals/Urinary Tract: Normal appearance of the adrenal glands. No kidney mass or hydronephrosis identified. The urinary bladder is collapsed around a Foley catheter balloon. Stomach/Bowel: Gastrostomy tube is noted within the stomach. The stomach is nondistended. The appendix is not visualized. No bowel wall thickening, inflammation or distension identified. Rectal tube in place. Vascular/Lymphatic: Aortic atherosclerosis. No aneurysm. No abdominopelvic adenopathy identified. Reproductive: Status post hysterectomy. No adnexal masses. Other: There is no free fluid or fluid collection. Musculoskeletal: Previous posterior hardware fixation of L3 and L4. Solid fusion of L3-4 and L4-5 disc space noted. Degenerative disc disease noted  at T12-L1 and L1-2. No signs of discitis or osteomyelitis IMPRESSION: 1. Imaging findings within the chest are compatible with the clinical history of COVID pneumonia. No lobar consolidation, signs of empyema or evidence of pulmonary abscess. 2. No acute findings identified within the abdomen or pelvis. 3. Aortic atherosclerosis. 4. Soft tissue stranding within the ventral abdominal wall at the level of the distal body of sternum and xiphoid process. This is nonspecific and may be related to patient lying prone for extended periods of time. Alternatively focal area of cellulitis may have this appearance. Recommend correlation with physical exam findings. Aortic Atherosclerosis (ICD10-I70.0). Electronically Signed   By: Kerby Moors M.D.   On: 11/17/2019 10:38    Assessment/Plan Active Problems:   Acute on chronic respiratory failure with hypoxia (HCC)   End stage renal disease on dialysis ([place])   COVID-19 virus infection   Pneumonia due to COVID-19 virus   1. Acute on chronic respiratory failure hypoxia we will continue with pressure assist control currently on 40% FiO2 will titrate oxygen as tolerated as tolerated 2. End-stage renal failure on hemodialysis being seen by nephrology may need more fluid removal 3. COVID-19 virus infection in recovery 4. Pneumonia due to COVID-19 has been treated continue to monitor.   I have personally seen and evaluated the patient, evaluated laboratory and imaging results, formulated the assessment and plan and placed orders. The Patient requires high complexity decision making with multiple systems involvement.  Rounds were done with the Respiratory Therapy Director and Staff therapists and discussed with nursing staff also.  Allyne Gee, MD [facility] Pulmonary Critical Care Medicine Sleep Medicine

## 2019-11-19 ENCOUNTER — Other Ambulatory Visit (HOSPITAL_COMMUNITY): Payer: Medicare Other

## 2019-11-20 ENCOUNTER — Other Ambulatory Visit (HOSPITAL_COMMUNITY): Payer: Medicare Other

## 2019-11-20 DIAGNOSIS — J9621 Acute and chronic respiratory failure with hypoxia: Secondary | ICD-10-CM | POA: Diagnosis not present

## 2019-11-20 DIAGNOSIS — N186 End stage renal disease: Secondary | ICD-10-CM | POA: Diagnosis not present

## 2019-11-20 DIAGNOSIS — U071 COVID-19: Secondary | ICD-10-CM | POA: Diagnosis not present

## 2019-11-20 DIAGNOSIS — Z9911 Dependence on respirator [ventilator] status: Secondary | ICD-10-CM | POA: Diagnosis not present

## 2019-11-20 LAB — URINALYSIS, ROUTINE W REFLEX MICROSCOPIC
Bilirubin Urine: NEGATIVE
Glucose, UA: NEGATIVE mg/dL
Ketones, ur: NEGATIVE mg/dL
Nitrite: NEGATIVE
Protein, ur: 30 mg/dL — AB
Specific Gravity, Urine: 1.011 (ref 1.005–1.030)
pH: 6 (ref 5.0–8.0)

## 2019-11-20 LAB — RENAL FUNCTION PANEL
Albumin: 3.3 g/dL — ABNORMAL LOW (ref 3.5–5.0)
Anion gap: 21 — ABNORMAL HIGH (ref 5–15)
BUN: 136 mg/dL — ABNORMAL HIGH (ref 8–23)
CO2: 30 mmol/L (ref 22–32)
Calcium: 9.3 mg/dL (ref 8.9–10.3)
Chloride: 95 mmol/L — ABNORMAL LOW (ref 98–111)
Creatinine, Ser: 2.84 mg/dL — ABNORMAL HIGH (ref 0.44–1.00)
GFR, Estimated: 17 mL/min — ABNORMAL LOW (ref >60)
Glucose, Bld: 179 mg/dL — ABNORMAL HIGH (ref 70–99)
Phosphorus: 6.4 mg/dL — ABNORMAL HIGH (ref 2.5–4.6)
Potassium: 4.3 mmol/L (ref 3.5–5.1)
Sodium: 146 mmol/L — ABNORMAL HIGH (ref 135–145)

## 2019-11-20 LAB — CULTURE, BLOOD (ROUTINE X 2)
Culture: NO GROWTH
Culture: NO GROWTH
Special Requests: ADEQUATE
Special Requests: ADEQUATE

## 2019-11-20 LAB — CBC
HCT: 27.5 % — ABNORMAL LOW (ref 36.0–46.0)
Hemoglobin: 8.5 g/dL — ABNORMAL LOW (ref 12.0–15.0)
MCH: 27.8 pg (ref 26.0–34.0)
MCHC: 30.9 g/dL (ref 30.0–36.0)
MCV: 89.9 fL (ref 80.0–100.0)
Platelets: 179 10*3/uL (ref 150–400)
RBC: 3.06 MIL/uL — ABNORMAL LOW (ref 3.87–5.11)
RDW: 19.1 % — ABNORMAL HIGH (ref 11.5–15.5)
WBC: 8.3 10*3/uL (ref 4.0–10.5)
nRBC: 0 % (ref 0.0–0.2)

## 2019-11-20 LAB — HEPARIN INDUCED PLATELET AB (HIT ANTIBODY): Heparin Induced Plt Ab: 0.215 OD (ref 0.000–0.400)

## 2019-11-20 NOTE — Progress Notes (Signed)
Pulmonary Critical Care Medicine [place]   PULMONARY CRITICAL CARE SERVICE  PROGRESS NOTE  Date of Service: 11/20/2019  Veronica Melton  [id number]:[id number]  DOB: 10-09-1950   DOA: 11/05/2019  Referring Physician: Merton Border, MD  HPI: Veronica Melton is a 70 y.o. female seen for follow up of Acute on Chronic Respiratory Failure.  She remains on the ventilator and full support.  Has been on 40% FiO2.  Medications: Reviewed on Rounds  Physical Exam:  Vitals: Temperature is 97.6 pulse 106 respiratory rate 27 blood pressure is 134/69 saturations 97%  Ventilator Settings on pressure assist control FiO2 is 40% tidal volume 361 PEEP 6 IP 21  . General: Comfortable at this time . Eyes: Grossly normal lids, irises & conjunctiva . ENT: grossly tongue is normal . Neck: no obvious mass . Cardiovascular: S1 S2 normal no gallop . Respiratory: No rhonchi very coarse breath sounds . Abdomen: soft . Skin: no rash seen on limited exam . Musculoskeletal: not rigid . Psychiatric:unable to assess . Neurologic: no seizure no involuntary movements         Lab Data:   Basic Metabolic Panel: Recent Labs  Lab 11/14/19 0551 11/16/19 0431 11/17/19 0422 11/17/19 1211 11/18/19 0508  NA 140 141 142  --  140  K 3.5 3.3* 4.3  --  4.6  CL 96* 96* 98  --  96*  CO2 27 28 28   --  28  GLUCOSE 185* 215* 189*  --  191*  BUN 139* 176* 170*  --  175*  CREATININE 3.55* 3.42* 3.33*  --  3.31*  CALCIUM 10.3 10.0 9.6  --  9.1  MG 2.3 2.2  --  2.5* 2.5*  PHOS 7.8* 7.8*  --   --  6.6*    ABG: Recent Labs  Lab 11/16/19 0137 11/17/19 0800 11/17/19 1530 11/18/19 0458  PHART 7.334* 7.397 7.306* 7.324*  PCO2ART 54.3* 49.9* 58.6* 58.0*  PO2ART 86.0 57.8* 170* 86.6  HCO3 28.1* 30.1* 28.0 29.4*  O2SAT 96.0 89.3 99.0 96.0    Liver Function Tests: Recent Labs  Lab 11/14/19 0551 11/16/19 0431 11/18/19 0508  ALBUMIN 3.3* 3.1* 3.0*   No results for input(s): LIPASE, AMYLASE in the  last 168 hours. No results for input(s): AMMONIA in the last 168 hours.  CBC: Recent Labs  Lab 11/14/19 0551 11/16/19 0431 11/17/19 1211 11/18/19 0508  WBC 7.2 9.0 4.9 3.8*  HGB 9.6* 9.6* 8.9* 8.2*  HCT 31.9* 32.6* 30.2* 27.3*  MCV 89.6 90.1 90.4 91.6  PLT 250 175 144* 133*    Cardiac Enzymes: No results for input(s): CKTOTAL, CKMB, CKMBINDEX, TROPONINI in the last 168 hours.  BNP (last 3 results) No results for input(s): BNP in the last 8760 hours.  ProBNP (last 3 results) No results for input(s): PROBNP in the last 8760 hours.  Radiological Exams: DG CHEST PORT 1 VIEW  Result Date: 11/20/2019 CLINICAL DATA:  Pneumonia and ARDS. EXAM: PORTABLE CHEST 1 VIEW COMPARISON:  11/17/2019 FINDINGS: Tracheostomy tube tip is above the carina. There is a right IJ catheter with tip in the inferior cavoatrial junction. Diffuse bilateral interstitial and airspace densities. When compared with the previous exam there is worsening opacification of the left lower lobe. No pneumothorax identified IMPRESSION: Worsening opacification of the left lower lobe with persistent diffuse bilateral interstitial and airspace densities. Electronically Signed   By: Kerby Moors M.D.   On: 11/20/2019 07:25    Assessment/Plan Active Problems:   Acute on chronic respiratory failure  with hypoxia ([place])   End stage renal disease on dialysis ([place])   COVID-19 virus infection   Pneumonia due to COVID-19 virus   1. Acute on chronic respiratory failure with hypoxia we will continue with full support on pressure control at this time right now is requiring 40% FiO2 2. End-stage renal failure on hemodialysis we will continue to monitor 3. COVID-19 virus infection in recovery 4. Pneumonia due to COVID-19 has been treated we will continue to follow along.   I have personally seen and evaluated the patient, evaluated laboratory and imaging results, formulated the assessment and plan and placed orders. The Patient  requires high complexity decision making with multiple systems involvement.  Rounds were done with the Respiratory Therapy Director and Staff therapists and discussed with nursing staff also.  Allyne Gee, MD [facility] Pulmonary Critical Care Medicine Sleep Medicine

## 2019-11-21 DIAGNOSIS — N186 End stage renal disease: Secondary | ICD-10-CM | POA: Diagnosis not present

## 2019-11-21 DIAGNOSIS — J9621 Acute and chronic respiratory failure with hypoxia: Secondary | ICD-10-CM | POA: Diagnosis not present

## 2019-11-21 DIAGNOSIS — Z9911 Dependence on respirator [ventilator] status: Secondary | ICD-10-CM | POA: Diagnosis not present

## 2019-11-21 DIAGNOSIS — U071 COVID-19: Secondary | ICD-10-CM | POA: Diagnosis not present

## 2019-11-21 LAB — RENAL FUNCTION PANEL
Albumin: 3.3 g/dL — ABNORMAL LOW (ref 3.5–5.0)
Anion gap: 16 — ABNORMAL HIGH (ref 5–15)
BUN: 119 mg/dL — ABNORMAL HIGH (ref 8–23)
CO2: 31 mmol/L (ref 22–32)
Calcium: 9.4 mg/dL (ref 8.9–10.3)
Chloride: 96 mmol/L — ABNORMAL LOW (ref 98–111)
Creatinine, Ser: 2.96 mg/dL — ABNORMAL HIGH (ref 0.44–1.00)
GFR, Estimated: 16 mL/min — ABNORMAL LOW (ref >60)
Glucose, Bld: 196 mg/dL — ABNORMAL HIGH (ref 70–99)
Phosphorus: 6.4 mg/dL — ABNORMAL HIGH (ref 2.5–4.6)
Potassium: 4.1 mmol/L (ref 3.5–5.1)
Sodium: 143 mmol/L (ref 135–145)

## 2019-11-21 LAB — CBC
HCT: 31.3 % — ABNORMAL LOW (ref 36.0–46.0)
Hemoglobin: 9.2 g/dL — ABNORMAL LOW (ref 12.0–15.0)
MCH: 26.6 pg (ref 26.0–34.0)
MCHC: 29.4 g/dL — ABNORMAL LOW (ref 30.0–36.0)
MCV: 90.5 fL (ref 80.0–100.0)
Platelets: 191 10*3/uL (ref 150–400)
RBC: 3.46 MIL/uL — ABNORMAL LOW (ref 3.87–5.11)
RDW: 19.3 % — ABNORMAL HIGH (ref 11.5–15.5)
WBC: 6.7 10*3/uL (ref 4.0–10.5)
nRBC: 0 % (ref 0.0–0.2)

## 2019-11-21 LAB — URINE CULTURE: Culture: NO GROWTH

## 2019-11-21 LAB — MAGNESIUM: Magnesium: 2.4 mg/dL (ref 1.7–2.4)

## 2019-11-21 LAB — TRIGLYCERIDES: Triglycerides: 548 mg/dL — ABNORMAL HIGH (ref <150)

## 2019-11-21 NOTE — Progress Notes (Signed)
[facility]  ROUNDING NOTE   Subjective:  Patient remains quite tachypneic at the moment. Continues to have good urine output of 2.1 L on Lasix drip. Azotemia has improved his BUN down to 119 with a creatinine of 2.96.   Objective:  Vital signs in last 24 hours:  Temperature nine 9.1 pulse 109 respirations 26 blood pressure 112/52 Physical Exam: General:  Critically ill-appearing  Head:  Normocephalic, atraumatic. Moist oral mucosal membranes  Eyes:  Anicteric  Neck:  Tracheostomy in place  Lungs:   Coarse breath sounds bilateral, vent assisted  Heart:  S1S2 no rubs  Abdomen:   Soft, nontender, bowel sounds present  Extremities:  Trace peripheral edema.  Neurologic:  Awake, not following commands  Skin:  No acute rashes  Access:  Removed dialysis catheter    Basic Metabolic Panel: Recent Labs  Lab 11/16/19 0431 11/16/19 0431 11/17/19 0422 11/17/19 0422 11/17/19 1211 11/18/19 0508 11/20/19 1038 11/21/19 0627  NA 141  --  142  --   --  140 146* 143  K 3.3*  --  4.3  --   --  4.6 4.3 4.1  CL 96*  --  98  --   --  96* 95* 96*  CO2 28  --  28  --   --  28 30 31   GLUCOSE 215*  --  189*  --   --  191* 179* 196*  BUN 176*  --  170*  --   --  175* 136* 119*  CREATININE 3.42*  --  3.33*  --   --  3.31* 2.84* 2.96*  CALCIUM 10.0   < > 9.6   < >  --  9.1 9.3 9.4  MG 2.2  --   --   --  2.5* 2.5*  --  2.4  PHOS 7.8*  --   --   --   --  6.6* 6.4* 6.4*   < > = values in this interval not displayed.    Liver Function Tests: Recent Labs  Lab 11/16/19 0431 11/18/19 0508 11/20/19 1038 11/21/19 0627  ALBUMIN 3.1* 3.0* 3.3* 3.3*   No results for input(s): LIPASE, AMYLASE in the last 168 hours. No results for input(s): AMMONIA in the last 168 hours.  CBC: Recent Labs  Lab 11/16/19 0431 11/17/19 1211 11/18/19 0508 11/20/19 1038 11/21/19 0627  WBC 9.0 4.9 3.8* 8.3 6.7  HGB 9.6* 8.9* 8.2* 8.5* 9.2*  HCT 32.6* 30.2* 27.3* 27.5* 31.3*  MCV 90.1 90.4 91.6  89.9 90.5  PLT 175 144* 133* 179 191    Cardiac Enzymes: No results for input(s): CKTOTAL, CKMB, CKMBINDEX, TROPONINI in the last 168 hours.  BNP: Invalid input(s): POCBNP  CBG: No results for input(s): GLUCAP in the last 168 hours.  Microbiology: Results for orders placed or performed during the hospital encounter of 11/05/19  Culture, respiratory (non-expectorated)     Status: None   Collection Time: 11/06/19  1:46 PM   Specimen: Tracheal Aspirate; Respiratory  Result Value Ref Range Status   Specimen Description TRACHEAL ASPIRATE  Final   Special Requests NONE  Final   Gram Stain   Final    RARE WBC PRESENT, PREDOMINANTLY PMN NO ORGANISMS SEEN    Culture   Final    RARE Normal respiratory flora-no Staph aureus or Pseudomonas seen Performed at [facility] [address] [place], [place] [address]    Report Status 11/08/2019 FINAL  Final  Urine Culture  Status: Abnormal   Collection Time: 11/10/19  1:36 PM   Specimen: Urine, Random  Result Value Ref Range Status   Specimen Description URINE, RANDOM  Final   Special Requests   Final    NONE Performed at [facility], [address] [place], [place] [address]    Culture 60,000 COLONIES/mL YEAST (A)  Final   Report Status 11/11/2019 FINAL  Final  Culture, blood (routine x 2)     Status: None   Collection Time: 11/12/19 10:00 PM   Specimen: BLOOD  Result Value Ref Range Status   Specimen Description BLOOD SITE NOT SPECIFIED  Final   Special Requests   Final    BOTTLES DRAWN AEROBIC AND ANAEROBIC Blood Culture results may not be optimal due to an excessive volume of blood received in culture bottles   Culture   Final    NO GROWTH 5 DAYS Performed at [facility], [place] [address], [place], [place] [address]    Report Status 11/17/2019 FINAL  Final  Culture, blood (routine x 2)     Status: Abnormal   Collection Time: 11/12/19 10:06 PM   Specimen: BLOOD  Result Value Ref Range Status    Specimen Description BLOOD SITE NOT SPECIFIED  Final   Special Requests   Final    BOTTLES DRAWN AEROBIC AND ANAEROBIC Blood Culture results may not be optimal due to an excessive volume of blood received in culture bottles   Culture  Setup Time   Final    GRAM POSITIVE COCCI ANAEROBIC BOTTLE ONLY CRITICAL RESULT CALLED TO, READ BACK BY AND VERIFIED WITH: RN J ALGHALI @0058  10/14/19 BY S GEZAHEGN  Performed at [facility], [place] [address], [place], [place] [address]    Culture STAPHYLOCOCCUS EPIDERMIDIS (A)  Final   Report Status 11/16/2019 FINAL  Final   Organism ID, Bacteria STAPHYLOCOCCUS EPIDERMIDIS  Final      Susceptibility   Staphylococcus epidermidis - MIC*    CIPROFLOXACIN >=8 RESISTANT Resistant     ERYTHROMYCIN >=8 RESISTANT Resistant     GENTAMICIN 8 INTERMEDIATE Intermediate     OXACILLIN >=4 RESISTANT Resistant     TETRACYCLINE 2 SENSITIVE Sensitive     VANCOMYCIN 1 SENSITIVE Sensitive     TRIMETH/SULFA 80 RESISTANT Resistant     CLINDAMYCIN >=8 RESISTANT Resistant     RIFAMPIN <=0.5 SENSITIVE Sensitive     Inducible Clindamycin NEGATIVE Sensitive     * STAPHYLOCOCCUS EPIDERMIDIS  Blood Culture ID Panel (Reflexed)     Status: Abnormal   Collection Time: 11/12/19 10:06 PM  Result Value Ref Range Status   Enterococcus faecalis NOT DETECTED NOT DETECTED Final   Enterococcus Faecium NOT DETECTED NOT DETECTED Final   Listeria monocytogenes NOT DETECTED NOT DETECTED Final   Staphylococcus species DETECTED (A) NOT DETECTED Final    Comment: CRITICAL RESULT CALLED TO, READ BACK BY AND VERIFIED WITH: RN J ALGHALI @0058  10/14/19 BY S GEZAHEGN     Staphylococcus aureus (BCID) NOT DETECTED NOT DETECTED Final   Staphylococcus epidermidis DETECTED (A) NOT DETECTED Final    Comment: Methicillin (oxacillin) resistant coagulase negative staphylococcus. Possible blood culture contaminant (unless isolated from more than one blood culture draw or clinical case suggests  pathogenicity). No antibiotic treatment is indicated for blood  culture contaminants. CRITICAL RESULT CALLED TO, READ BACK BY AND VERIFIED WITH: RN J ALGHALI @0058  10/14/19 BY S GEZAHEGN     Staphylococcus lugdunensis NOT DETECTED NOT DETECTED Final   Streptococcus  species NOT DETECTED NOT DETECTED Final   Streptococcus agalactiae NOT DETECTED NOT DETECTED Final   Streptococcus pneumoniae NOT DETECTED NOT DETECTED Final   Streptococcus pyogenes NOT DETECTED NOT DETECTED Final   A.calcoaceticus-baumannii NOT DETECTED NOT DETECTED Final   Bacteroides fragilis NOT DETECTED NOT DETECTED Final   Enterobacterales NOT DETECTED NOT DETECTED Final   Enterobacter cloacae complex NOT DETECTED NOT DETECTED Final   Escherichia coli NOT DETECTED NOT DETECTED Final   Klebsiella aerogenes NOT DETECTED NOT DETECTED Final   Klebsiella oxytoca NOT DETECTED NOT DETECTED Final   Klebsiella pneumoniae NOT DETECTED NOT DETECTED Final   Proteus species NOT DETECTED NOT DETECTED Final   Salmonella species NOT DETECTED NOT DETECTED Final   Serratia marcescens NOT DETECTED NOT DETECTED Final   Haemophilus influenzae NOT DETECTED NOT DETECTED Final   Neisseria meningitidis NOT DETECTED NOT DETECTED Final   Pseudomonas aeruginosa NOT DETECTED NOT DETECTED Final   Stenotrophomonas maltophilia NOT DETECTED NOT DETECTED Final   Candida albicans NOT DETECTED NOT DETECTED Final   Candida auris NOT DETECTED NOT DETECTED Final   Candida glabrata NOT DETECTED NOT DETECTED Final   Candida krusei NOT DETECTED NOT DETECTED Final   Candida parapsilosis NOT DETECTED NOT DETECTED Final   Candida tropicalis NOT DETECTED NOT DETECTED Final   Cryptococcus neoformans/gattii NOT DETECTED NOT DETECTED Final   Methicillin resistance mecA/C DETECTED (A) NOT DETECTED Final    Comment: CRITICAL RESULT CALLED TO, READ BACK BY AND VERIFIED WITH: RN J ALGHALI @0058  10/14/19 BY S GEZAHEGN  Performed at [facility], [address], [place], [place] [address]   Culture, respiratory (non-expectorated)     Status: None   Collection Time: 11/12/19 10:23 PM   Specimen: Tracheal Aspirate; Respiratory  Result Value Ref Range Status   Specimen Description TRACHEAL ASPIRATE  Final   Special Requests NONE  Final   Gram Stain   Final    RARE WBC PRESENT,BOTH PMN AND MONONUCLEAR NO ORGANISMS SEEN Performed at [facility], [address] [place], [place] [address]    Culture FEW CANDIDA ALBICANS  Final   Report Status 11/15/2019 FINAL  Final  Culture, blood (routine x 2)     Status: None   Collection Time: 11/15/19  3:24 PM   Specimen: BLOOD RIGHT HAND  Result Value Ref Range Status   Specimen Description BLOOD RIGHT HAND  Final   Special Requests   Final    BOTTLES DRAWN AEROBIC AND ANAEROBIC Blood Culture adequate volume   Culture   Final    NO GROWTH 5 DAYS Performed at [facility], [place] [address], [place], [place] [address]    Report Status 11/20/2019 FINAL  Final  Culture, blood (routine x 2)     Status: None   Collection Time: 11/15/19  3:27 PM   Specimen: BLOOD LEFT HAND  Result Value Ref Range Status   Specimen Description BLOOD LEFT HAND  Final   Special Requests   Final    BOTTLES DRAWN AEROBIC AND ANAEROBIC Blood Culture adequate volume   Culture   Final    NO GROWTH 5 DAYS Performed at [facility], [place] [address] [place], [place] [address]    Report Status 11/20/2019 FINAL  Final    Coagulation Studies: No results for input(s): LABPROT, INR in the last 72 hours.  Urinalysis: Recent Labs    11/20/19 1608  COLORURINE YELLOW  LABSPEC 1.011  PHURINE 6.0  GLUCOSEU NEGATIVE  HGBUR  SMALL*  BILIRUBINUR NEGATIVE  KETONESUR NEGATIVE  PROTEINUR 30*  NITRITE NEGATIVE  LEUKOCYTESUR TRACE*      Imaging: DG CHEST PORT 1 VIEW  Result Date: 11/20/2019 CLINICAL DATA:  Pneumonia and ARDS. EXAM: PORTABLE CHEST 1 VIEW COMPARISON:  11/17/2019 FINDINGS: Tracheostomy tube tip is  above the carina. There is a right IJ catheter with tip in the inferior cavoatrial junction. Diffuse bilateral interstitial and airspace densities. When compared with the previous exam there is worsening opacification of the left lower lobe. No pneumothorax identified IMPRESSION: Worsening opacification of the left lower lobe with persistent diffuse bilateral interstitial and airspace densities. Electronically Signed   By: Kerby Moors M.D.   On: 11/20/2019 07:25     Medications:       Assessment/ Plan:  69 y.o. female with a PMHx of diabetes mellitus type 2, hyperlipidemia, plantar fasciitis, B12 deficiency, chronic low back pain, restless leg syndrome, GERD, nonalcoholic fatty liver disease, anxiety, nephrolithiasis, irritable bowel syndrome, hypertension, chronic kidney disease stage IIIa, who was admitted to [facility] on 11/05/2019 for ongoing treatment of post COVID-19 recovery.  1.  Acute kidney injury/chronic kidney disease stage IIIa.    Azotemia actually improved with BUN of 119 and creatinine 2.96.  Good urine output of 2.1 L.  Okay to continue Lasix drip.  No immediate need to resume dialysis.  2.  Acute respiratory failure secondary to COVID-19 infection.    Maintained on ventilatory support.  3.  Anemia of chronic kidney disease.    Hemoglobin currently 9.2.  Continue to monitor.  4.  Secondary hyperparathyroidism.  Phosphorus currently 6.4 and slightly better than before.  5.  Bacteremia.  1 or 2 culture shows Staph epidermidis.  Dialysis catheter was removed.    LOS: 0 Nasha Diss 11/8/20218:00 AM

## 2019-11-21 NOTE — Progress Notes (Signed)
Pulmonary Critical Care Medicine [place]   PULMONARY CRITICAL CARE SERVICE  PROGRESS NOTE  Date of Service: 11/21/2019  Veronica Melton  BWG:[id number]  DOB: 05/12/1950   DOA: 11/05/2019  Referring Physician: Merton Border, MD  HPI: Veronica Melton is a 69 y.o. female seen for follow up of Acute on Chronic Respiratory Failure.  Patient currently is on pressure control mode and is on multiple drips including fentanyl propofol and Lasix.  Nephrology did see the patient and may be considering dialysis again.  Medications: Reviewed on Rounds  Physical Exam:  Vitals: Temperature is 99.1 pulse 109 respiratory 36 blood pressure is 112/52 saturations 95%  Ventilator Settings on pressure assist control FiO2 40% tidal volume 410 PEEP 6  . General: Comfortable at this time . Eyes: Grossly normal lids, irises & conjunctiva . ENT: grossly tongue is normal . Neck: no obvious mass . Cardiovascular: S1 S2 normal no gallop . Respiratory: No rhonchi very coarse breath sounds . Abdomen: soft . Skin: no rash seen on limited exam . Musculoskeletal: not rigid . Psychiatric:unable to assess . Neurologic: no seizure no involuntary movements         Lab Data:   Basic Metabolic Panel: Recent Labs  Lab 11/16/19 0431 11/17/19 0422 11/17/19 1211 11/18/19 0508 11/20/19 1038 11/21/19 0627  NA 141 142  --  140 146* 143  K 3.3* 4.3  --  4.6 4.3 4.1  CL 96* 98  --  96* 95* 96*  CO2 28 28  --  28 30 31   GLUCOSE 215* 189*  --  191* 179* 196*  BUN 176* 170*  --  175* 136* 119*  CREATININE 3.42* 3.33*  --  3.31* 2.84* 2.96*  CALCIUM 10.0 9.6  --  9.1 9.3 9.4  MG 2.2  --  2.5* 2.5*  --  2.4  PHOS 7.8*  --   --  6.6* 6.4* 6.4*    ABG: Recent Labs  Lab 11/16/19 0137 11/17/19 0800 11/17/19 1530 11/18/19 0458  PHART 7.334* 7.397 7.306* 7.324*  PCO2ART 54.3* 49.9* 58.6* 58.0*  PO2ART 86.0 57.8* 170* 86.6  HCO3 28.1* 30.1* 28.0 29.4*  O2SAT 96.0 89.3 99.0 96.0    Liver  Function Tests: Recent Labs  Lab 11/16/19 0431 11/18/19 0508 11/20/19 1038 11/21/19 0627  ALBUMIN 3.1* 3.0* 3.3* 3.3*   No results for input(s): LIPASE, AMYLASE in the last 168 hours. No results for input(s): AMMONIA in the last 168 hours.  CBC: Recent Labs  Lab 11/16/19 0431 11/17/19 1211 11/18/19 0508 11/20/19 1038 11/21/19 0627  WBC 9.0 4.9 3.8* 8.3 6.7  HGB 9.6* 8.9* 8.2* 8.5* 9.2*  HCT 32.6* 30.2* 27.3* 27.5* 31.3*  MCV 90.1 90.4 91.6 89.9 90.5  PLT 175 144* 133* 179 191    Cardiac Enzymes: No results for input(s): CKTOTAL, CKMB, CKMBINDEX, TROPONINI in the last 168 hours.  BNP (last 3 results) No results for input(s): BNP in the last 8760 hours.  ProBNP (last 3 results) No results for input(s): PROBNP in the last 8760 hours.  Radiological Exams: DG CHEST PORT 1 VIEW  Result Date: 11/20/2019 CLINICAL DATA:  Pneumonia and ARDS. EXAM: PORTABLE CHEST 1 VIEW COMPARISON:  11/17/2019 FINDINGS: Tracheostomy tube tip is above the carina. There is a right IJ catheter with tip in the inferior cavoatrial junction. Diffuse bilateral interstitial and airspace densities. When compared with the previous exam there is worsening opacification of the left lower lobe. No pneumothorax identified IMPRESSION: Worsening opacification of the left lower lobe  with persistent diffuse bilateral interstitial and airspace densities. Electronically Signed   By: Kerby Moors M.D.   On: 11/20/2019 07:25    Assessment/Plan Active Problems:   Acute on chronic respiratory failure with hypoxia (HCC)   End stage renal disease on dialysis ([place])   COVID-19 virus infection   Pneumonia due to COVID-19 virus   1. Acute on chronic respiratory failure with hypoxia we will continue with full support on the ventilator right now patient is not a candidate for any type of weaning.  Still significant fluid overload noted on the chest films of worsening of the basilar areas. 2. End-stage renal failure has  been on dialysis right now is off dialysis nephrology is following along 3. COVID-19 virus infection in recovery 4. Pneumonia due to COVID-19 has had slow improvement she also has residual pulmonary damage from the infection itself   I have personally seen and evaluated the patient, evaluated laboratory and imaging results, formulated the assessment and plan and placed orders. The Patient requires high complexity decision making with multiple systems involvement.  Rounds were done with the Respiratory Therapy Director and Staff therapists and discussed with nursing staff also.  Allyne Gee, MD [facility] Pulmonary Critical Care Medicine Sleep Medicine

## 2019-11-22 ENCOUNTER — Other Ambulatory Visit (HOSPITAL_COMMUNITY): Payer: Medicare Other

## 2019-11-23 LAB — CBC
HCT: 29.1 % — ABNORMAL LOW (ref 36.0–46.0)
Hemoglobin: 8.6 g/dL — ABNORMAL LOW (ref 12.0–15.0)
MCH: 27 pg (ref 26.0–34.0)
MCHC: 29.6 g/dL — ABNORMAL LOW (ref 30.0–36.0)
MCV: 91.5 fL (ref 80.0–100.0)
Platelets: 228 10*3/uL (ref 150–400)
RBC: 3.18 MIL/uL — ABNORMAL LOW (ref 3.87–5.11)
RDW: 18.9 % — ABNORMAL HIGH (ref 11.5–15.5)
WBC: 9 10*3/uL (ref 4.0–10.5)
nRBC: 0 % (ref 0.0–0.2)

## 2019-11-23 LAB — RENAL FUNCTION PANEL
Albumin: 3.3 g/dL — ABNORMAL LOW (ref 3.5–5.0)
Anion gap: 17 — ABNORMAL HIGH (ref 5–15)
BUN: 77 mg/dL — ABNORMAL HIGH (ref 8–23)
CO2: 31 mmol/L (ref 22–32)
Calcium: 9.7 mg/dL (ref 8.9–10.3)
Chloride: 94 mmol/L — ABNORMAL LOW (ref 98–111)
Creatinine, Ser: 2.29 mg/dL — ABNORMAL HIGH (ref 0.44–1.00)
GFR, Estimated: 22 mL/min — ABNORMAL LOW (ref >60)
Glucose, Bld: 159 mg/dL — ABNORMAL HIGH (ref 70–99)
Phosphorus: 5.2 mg/dL — ABNORMAL HIGH (ref 2.5–4.6)
Potassium: 3.8 mmol/L (ref 3.5–5.1)
Sodium: 142 mmol/L (ref 135–145)

## 2019-11-23 LAB — FUNGITELL, SERUM: Fungitell Result: 54 pg/mL (ref <80)

## 2019-11-23 LAB — MAGNESIUM: Magnesium: 2.2 mg/dL (ref 1.7–2.4)

## 2019-11-23 NOTE — Progress Notes (Signed)
[facility]  ROUNDING NOTE   Subjective:  Patient remains on the ventilator and is sedated. Continues to have reasonable urine output. BUN down to 77 with a creatinine of 2.29.   Objective:  Vital signs in last 24 hours:  Temperature 98.5 pulse 105 respirations 31 blood pressure 132/54 Physical Exam: General:  Critically ill-appearing  Head:  Normocephalic, atraumatic. Moist oral mucosal membranes  Eyes:  Anicteric  Neck:  Tracheostomy in place  Lungs:   Coarse breath sounds bilateral, vent assisted  Heart:  S1S2 no rubs  Abdomen:   Soft, nontender, bowel sounds present  Extremities:  Trace peripheral edema.  Neurologic:  Sedated  Skin:  No acute rashes  Access:  Removed dialysis catheter    Basic Metabolic Panel: Recent Labs  Lab 11/17/19 0422 11/17/19 0422 11/17/19 1211 11/18/19 0508 11/18/19 0508 11/20/19 1038 11/21/19 0627 11/23/19 0446  NA 142  --   --  140  --  146* 143 142  K 4.3  --   --  4.6  --  4.3 4.1 3.8  CL 98  --   --  96*  --  95* 96* 94*  CO2 28  --   --  28  --  30 31 31   GLUCOSE 189*  --   --  191*  --  179* 196* 159*  BUN 170*  --   --  175*  --  136* 119* 77*  CREATININE 3.33*  --   --  3.31*  --  2.84* 2.96* 2.29*  CALCIUM 9.6   < >  --  9.1   < > 9.3 9.4 9.7  MG  --   --  2.5* 2.5*  --   --  2.4 2.2  PHOS  --   --   --  6.6*  --  6.4* 6.4* 5.2*   < > = values in this interval not displayed.    Liver Function Tests: Recent Labs  Lab 11/18/19 0508 11/20/19 1038 11/21/19 0627 11/23/19 0446  ALBUMIN 3.0* 3.3* 3.3* 3.3*   No results for input(s): LIPASE, AMYLASE in the last 168 hours. No results for input(s): AMMONIA in the last 168 hours.  CBC: Recent Labs  Lab 11/17/19 1211 11/18/19 0508 11/20/19 1038 11/21/19 0627 11/23/19 0446  WBC 4.9 3.8* 8.3 6.7 9.0  HGB 8.9* 8.2* 8.5* 9.2* 8.6*  HCT 30.2* 27.3* 27.5* 31.3* 29.1*  MCV 90.4 91.6 89.9 90.5 91.5  PLT 144* 133* 179 191 228    Cardiac Enzymes: No results  for input(s): CKTOTAL, CKMB, CKMBINDEX, TROPONINI in the last 168 hours.  BNP: Invalid input(s): POCBNP  CBG: No results for input(s): GLUCAP in the last 168 hours.  Microbiology: Results for orders placed or performed during the hospital encounter of 11/05/19  Culture, respiratory (non-expectorated)     Status: None   Collection Time: 11/06/19  1:46 PM   Specimen: Tracheal Aspirate; Respiratory  Result Value Ref Range Status   Specimen Description TRACHEAL ASPIRATE  Final   Special Requests NONE  Final   Gram Stain   Final    RARE WBC PRESENT, PREDOMINANTLY PMN NO ORGANISMS SEEN    Culture   Final    RARE Normal respiratory flora-no Staph aureus or Pseudomonas seen Performed at [facility] [address] [place], [place] [address]    Report Status 11/08/2019 FINAL  Final  Urine Culture     Status: Abnormal   Collection Time: 11/10/19  1:36 PM   Specimen:  Urine, Random  Result Value Ref Range Status   Specimen Description URINE, RANDOM  Final   Special Requests   Final    NONE Performed at [facility], [address] [place], [place] [address]    Culture 60,000 COLONIES/mL YEAST (A)  Final   Report Status 11/11/2019 FINAL  Final  Culture, blood (routine x 2)     Status: None   Collection Time: 11/12/19 10:00 PM   Specimen: BLOOD  Result Value Ref Range Status   Specimen Description BLOOD SITE NOT SPECIFIED  Final   Special Requests   Final    BOTTLES DRAWN AEROBIC AND ANAEROBIC Blood Culture results may not be optimal due to an excessive volume of blood received in culture bottles   Culture   Final    NO GROWTH 5 DAYS Performed at [facility], [place] [address] [place],  [address]    Report Status 11/17/2019 FINAL  Final  Culture, blood (routine x 2)     Status: Abnormal   Collection Time: 11/12/19 10:06 PM   Specimen: BLOOD  Result Value Ref Range Status   Specimen Description BLOOD SITE NOT SPECIFIED  Final   Special Requests   Final     BOTTLES DRAWN AEROBIC AND ANAEROBIC Blood Culture results may not be optimal due to an excessive volume of blood received in culture bottles   Culture  Setup Time   Final    GRAM POSITIVE COCCI ANAEROBIC BOTTLE ONLY CRITICAL RESULT CALLED TO, READ BACK BY AND VERIFIED WITH: RN J ALGHALI @0058  10/14/19 BY S GEZAHEGN  Performed at [facility], [place] [address], [place], [place] [address]    Culture STAPHYLOCOCCUS EPIDERMIDIS (A)  Final   Report Status 11/16/2019 FINAL  Final   Organism ID, Bacteria STAPHYLOCOCCUS EPIDERMIDIS  Final      Susceptibility   Staphylococcus epidermidis - MIC*    CIPROFLOXACIN >=8 RESISTANT Resistant     ERYTHROMYCIN >=8 RESISTANT Resistant     GENTAMICIN 8 INTERMEDIATE Intermediate     OXACILLIN >=4 RESISTANT Resistant     TETRACYCLINE 2 SENSITIVE Sensitive     VANCOMYCIN 1 SENSITIVE Sensitive     TRIMETH/SULFA 80 RESISTANT Resistant     CLINDAMYCIN >=8 RESISTANT Resistant     RIFAMPIN <=0.5 SENSITIVE Sensitive     Inducible Clindamycin NEGATIVE Sensitive     * STAPHYLOCOCCUS EPIDERMIDIS  Blood Culture ID Panel (Reflexed)     Status: Abnormal   Collection Time: 11/12/19 10:06 PM  Result Value Ref Range Status   Enterococcus faecalis NOT DETECTED NOT DETECTED Final   Enterococcus Faecium NOT DETECTED NOT DETECTED Final   Listeria monocytogenes NOT DETECTED NOT DETECTED Final   Staphylococcus species DETECTED (A) NOT DETECTED Final    Comment: CRITICAL RESULT CALLED TO, READ BACK BY AND VERIFIED WITH: RN J ALGHALI @0058  10/14/19 BY S GEZAHEGN     Staphylococcus aureus (BCID) NOT DETECTED NOT DETECTED Final   Staphylococcus epidermidis DETECTED (A) NOT DETECTED Final    Comment: Methicillin (oxacillin) resistant coagulase negative staphylococcus. Possible blood culture contaminant (unless isolated from more than one blood culture draw or clinical case suggests pathogenicity). No antibiotic treatment is indicated for blood  culture  contaminants. CRITICAL RESULT CALLED TO, READ BACK BY AND VERIFIED WITH: RN J ALGHALI @0058  10/14/19 BY S GEZAHEGN     Staphylococcus lugdunensis NOT DETECTED NOT DETECTED Final   Streptococcus species NOT DETECTED NOT DETECTED Final   Streptococcus agalactiae NOT DETECTED NOT  DETECTED Final   Streptococcus pneumoniae NOT DETECTED NOT DETECTED Final   Streptococcus pyogenes NOT DETECTED NOT DETECTED Final   A.calcoaceticus-baumannii NOT DETECTED NOT DETECTED Final   Bacteroides fragilis NOT DETECTED NOT DETECTED Final   Enterobacterales NOT DETECTED NOT DETECTED Final   Enterobacter cloacae complex NOT DETECTED NOT DETECTED Final   Escherichia coli NOT DETECTED NOT DETECTED Final   Klebsiella aerogenes NOT DETECTED NOT DETECTED Final   Klebsiella oxytoca NOT DETECTED NOT DETECTED Final   Klebsiella pneumoniae NOT DETECTED NOT DETECTED Final   Proteus species NOT DETECTED NOT DETECTED Final   Salmonella species NOT DETECTED NOT DETECTED Final   Serratia marcescens NOT DETECTED NOT DETECTED Final   Haemophilus influenzae NOT DETECTED NOT DETECTED Final   Neisseria meningitidis NOT DETECTED NOT DETECTED Final   Pseudomonas aeruginosa NOT DETECTED NOT DETECTED Final   Stenotrophomonas maltophilia NOT DETECTED NOT DETECTED Final   Candida albicans NOT DETECTED NOT DETECTED Final   Candida auris NOT DETECTED NOT DETECTED Final   Candida glabrata NOT DETECTED NOT DETECTED Final   Candida krusei NOT DETECTED NOT DETECTED Final   Candida parapsilosis NOT DETECTED NOT DETECTED Final   Candida tropicalis NOT DETECTED NOT DETECTED Final   Cryptococcus neoformans/gattii NOT DETECTED NOT DETECTED Final   Methicillin resistance mecA/C DETECTED (A) NOT DETECTED Final    Comment: CRITICAL RESULT CALLED TO, READ BACK BY AND VERIFIED WITH: RN J ALGHALI @0058  10/14/19 BY S GEZAHEGN  Performed at [facility], [address], [place], [place] [address]   Culture, respiratory (non-expectorated)      Status: None   Collection Time: 11/12/19 10:23 PM   Specimen: Tracheal Aspirate; Respiratory  Result Value Ref Range Status   Specimen Description TRACHEAL ASPIRATE  Final   Special Requests NONE  Final   Gram Stain   Final    RARE WBC PRESENT,BOTH PMN AND MONONUCLEAR NO ORGANISMS SEEN Performed at [facility], [address] [place], [place] [address]    Culture FEW CANDIDA ALBICANS  Final   Report Status 11/15/2019 FINAL  Final  Culture, blood (routine x 2)     Status: None   Collection Time: 11/15/19  3:24 PM   Specimen: BLOOD RIGHT HAND  Result Value Ref Range Status   Specimen Description BLOOD RIGHT HAND  Final   Special Requests   Final    BOTTLES DRAWN AEROBIC AND ANAEROBIC Blood Culture adequate volume   Culture   Final    NO GROWTH 5 DAYS Performed at [facility], [place] [address], [place], [place] [address]    Report Status 11/20/2019 FINAL  Final  Culture, blood (routine x 2)     Status: None   Collection Time: 11/15/19  3:27 PM   Specimen: BLOOD LEFT HAND  Result Value Ref Range Status   Specimen Description BLOOD LEFT HAND  Final   Special Requests   Final    BOTTLES DRAWN AEROBIC AND ANAEROBIC Blood Culture adequate volume   Culture   Final    NO GROWTH 5 DAYS Performed at [facility], [place] [address], [place], [place] [address]    Report Status 11/20/2019 FINAL  Final  Culture, Urine     Status: None   Collection Time: 11/20/19  4:05 PM   Specimen: Urine, Random  Result Value Ref Range Status   Specimen Description URINE, RANDOM  Final   Special Requests NONE  Final   Culture   Final  NO GROWTH Performed at [facility], [place] [address], [place], [place] [address]    Report Status 11/21/2019 FINAL  Final  Culture, blood (routine x 2)     Status: None (Preliminary result)   Collection Time: 11/20/19  4:08 PM   Specimen: BLOOD  Result Value Ref Range Status   Specimen Description BLOOD SITE NOT SPECIFIED  Final   Special  Requests   Final    BOTTLES DRAWN AEROBIC AND ANAEROBIC Blood Culture adequate volume   Culture   Final    NO GROWTH 3 DAYS Performed at [facility], [place] [address], [place], [place] [address]    Report Status PENDING  Incomplete  Culture, blood (routine x 2)     Status: None (Preliminary result)   Collection Time: 11/20/19  4:15 PM   Specimen: BLOOD  Result Value Ref Range Status   Specimen Description BLOOD SITE NOT SPECIFIED  Final   Special Requests   Final    BOTTLES DRAWN AEROBIC AND ANAEROBIC Blood Culture results may not be optimal due to an excessive volume of blood received in culture bottles   Culture   Final    NO GROWTH 3 DAYS Performed at [facility], [place] [address], [place], [place] [address]    Report Status PENDING  Incomplete  Culture, respiratory (non-expectorated)     Status: None (Preliminary result)   Collection Time: 11/20/19  6:43 PM   Specimen: Tracheal Aspirate; Respiratory  Result Value Ref Range Status   Specimen Description TRACHEAL ASPIRATE  Final   Special Requests NONE  Final   Gram Stain   Final    FEW WBC PRESENT,BOTH PMN AND MONONUCLEAR NO ORGANISMS SEEN    Culture   Final    CULTURE REINCUBATED FOR BETTER GROWTH Performed at [facility], [address] [place], [place] [address]    Report Status PENDING  Incomplete    Coagulation Studies: No results for input(s): LABPROT, INR in the last 72 hours.  Urinalysis: Recent Labs    11/20/19 1608  COLORURINE YELLOW  LABSPEC 1.011  PHURINE 6.0  GLUCOSEU NEGATIVE  HGBUR SMALL*  BILIRUBINUR NEGATIVE  KETONESUR NEGATIVE  PROTEINUR 30*  NITRITE NEGATIVE  LEUKOCYTESUR TRACE*      Imaging: DG Chest Port 1 View  Result Date: 11/22/2019 CLINICAL DATA:  Pneumonia EXAM: PORTABLE CHEST 1 VIEW COMPARISON:  Two days ago FINDINGS: Right IJ line with tip at the lower right atrium. The tracheostomy tube remains in place. Extensive bilateral airspace disease with mild increase  in lung volumes. There Is fibrosis by chest CT. Generous heart size. No visible effusion or air leak IMPRESSION: 1. Unchanged hardware including right IJ line with tip at the lower right atrium. 2. Mild improvement in lung volumes. Electronically Signed   By: Monte Fantasia M.D.   On: 11/22/2019 06:46     Medications:       Assessment/ Plan:  69 y.o. female with a PMHx of diabetes mellitus type 2, hyperlipidemia, plantar fasciitis, B12 deficiency, chronic low back pain, restless leg syndrome, GERD, nonalcoholic fatty liver disease, anxiety, nephrolithiasis, irritable bowel syndrome, hypertension, chronic kidney disease stage IIIa, who was admitted to [facility] on 11/05/2019 for ongoing treatment of post COVID-19 recovery.  1.  Acute kidney injury/chronic kidney disease stage IIIa.    Renal function continues to improve.  BUN down to 77 with a creatinine of 2.29.  No need for dialysis.  2.  Acute respiratory failure secondary  to COVID-19 infection.    Continue current ventilatory support.  Weaning as per pulmonary/critical care.  3.  Anemia of chronic kidney disease.    Hemoglobin currently 8.6.  4.  Secondary hyperparathyroidism.  Phosphorus has come down to 5.2.  Within acceptable range.  Continue to monitor.  5.  Bacteremia.  1 or 2 culture shows Staph epidermidis.  Dialysis catheter was removed.    LOS: 0 Veronica Melton 11/10/20219:48 AM

## 2019-11-23 NOTE — Progress Notes (Signed)
Pulmonary Critical Care Medicine [facility]   PULMONARY CRITICAL CARE SERVICE  PROGRESS NOTE  Date of Service: 11/23/2019  Irina Okelly  HEN:[id number]  DOB: [birth year]   DOA: 11/05/2019  Referring Physician: Merton Border, MD  HPI: Veronica Melton is a 69 y.o. female seen for follow up of Acute on Chronic Respiratory Failure.  Patient is on the ventilator on full support not doing very well remains on pressure control mode  Medications: Reviewed on Rounds  Physical Exam:  Vitals: Temperature is 98.5 pulse 105 respiratory rate is 30 blood pressure is 132/51 saturations 99%  Ventilator Settings on pressure assist control FiO2 is 40% IP 21 PEEP 6  . General: Comfortable at this time . Eyes: Grossly normal lids, irises & conjunctiva . ENT: grossly tongue is normal . Neck: no obvious mass . Cardiovascular: S1 S2 normal no gallop . Respiratory: No rhonchi very coarse breath sounds . Abdomen: soft . Skin: no rash seen on limited exam . Musculoskeletal: not rigid . Psychiatric:unable to assess . Neurologic: no seizure no involuntary movements         Lab Data:   Basic Metabolic Panel: Recent Labs  Lab 11/17/19 0422 11/17/19 1211 11/18/19 0508 11/20/19 1038 11/21/19 0627 11/23/19 0446  NA 142  --  140 146* 143 142  K 4.3  --  4.6 4.3 4.1 3.8  CL 98  --  96* 95* 96* 94*  CO2 28  --  28 30 31 31   GLUCOSE 189*  --  191* 179* 196* 159*  BUN 170*  --  175* 136* 119* 77*  CREATININE 3.33*  --  3.31* 2.84* 2.96* 2.29*  CALCIUM 9.6  --  9.1 9.3 9.4 9.7  MG  --  2.5* 2.5*  --  2.4 2.2  PHOS  --   --  6.6* 6.4* 6.4* 5.2*    ABG: Recent Labs  Lab 11/17/19 0800 11/17/19 1530 11/18/19 0458  PHART 7.397 7.306* 7.324*  PCO2ART 49.9* 58.6* 58.0*  PO2ART 57.8* 170* 86.6  HCO3 30.1* 28.0 29.4*  O2SAT 89.3 99.0 96.0    Liver Function Tests: Recent Labs  Lab 11/18/19 0508 11/20/19 1038 11/21/19 0627 11/23/19 0446  ALBUMIN 3.0* 3.3* 3.3* 3.3*   No  results for input(s): LIPASE, AMYLASE in the last 168 hours. No results for input(s): AMMONIA in the last 168 hours.  CBC: Recent Labs  Lab 11/17/19 1211 11/18/19 0508 11/20/19 1038 11/21/19 0627 11/23/19 0446  WBC 4.9 3.8* 8.3 6.7 9.0  HGB 8.9* 8.2* 8.5* 9.2* 8.6*  HCT 30.2* 27.3* 27.5* 31.3* 29.1*  MCV 90.4 91.6 89.9 90.5 91.5  PLT 144* 133* 179 191 228    Cardiac Enzymes: No results for input(s): CKTOTAL, CKMB, CKMBINDEX, TROPONINI in the last 168 hours.  BNP (last 3 results) No results for input(s): BNP in the last 8760 hours.  ProBNP (last 3 results) No results for input(s): PROBNP in the last 8760 hours.  Radiological Exams: DG Chest Port 1 View  Result Date: 11/22/2019 CLINICAL DATA:  Pneumonia EXAM: PORTABLE CHEST 1 VIEW COMPARISON:  Two days ago FINDINGS: Right IJ line with tip at the lower right atrium. The tracheostomy tube remains in place. Extensive bilateral airspace disease with mild increase in lung volumes. There Is fibrosis by chest CT. Generous heart size. No visible effusion or air leak IMPRESSION: 1. Unchanged hardware including right IJ line with tip at the lower right atrium. 2. Mild improvement in lung volumes. Electronically Signed   By: Angelica Chessman  Watts M.D.   On: 11/22/2019 06:46    Assessment/Plan Active Problems:   Acute on chronic respiratory failure with hypoxia (HCC)   End stage renal disease on dialysis ([place])   COVID-19 virus infection   Pneumonia due to COVID-19 virus   1. Acute on chronic respiratory failure hypoxia we will continue with pressure control mode titrate oxygen continue pulmonary toilet. 2. End-stage renal failure on hemodialysis we will continue to monitor 3. COVID-19 virus infection work-up 4. Pneumonia COVID-19 treated slow improvement   I have personally seen and evaluated the patient, evaluated laboratory and imaging results, formulated the assessment and plan and placed orders. The Patient requires high complexity  decision making with multiple systems involvement.  Rounds were done with the Respiratory Therapy Director and Staff therapists and discussed with nursing staff also.  Allyne Gee, MD [facility] Pulmonary Critical Care Medicine Sleep Medicine

## 2019-11-24 ENCOUNTER — Other Ambulatory Visit (HOSPITAL_COMMUNITY): Payer: Medicare Other

## 2019-11-24 DIAGNOSIS — Z9911 Dependence on respirator [ventilator] status: Secondary | ICD-10-CM | POA: Diagnosis not present

## 2019-11-24 DIAGNOSIS — J9621 Acute and chronic respiratory failure with hypoxia: Secondary | ICD-10-CM | POA: Diagnosis not present

## 2019-11-24 DIAGNOSIS — U071 COVID-19: Secondary | ICD-10-CM | POA: Diagnosis not present

## 2019-11-24 DIAGNOSIS — N186 End stage renal disease: Secondary | ICD-10-CM | POA: Diagnosis not present

## 2019-11-24 LAB — CULTURE, RESPIRATORY W GRAM STAIN

## 2019-11-24 LAB — CBC
HCT: 30.1 % — ABNORMAL LOW (ref 36.0–46.0)
Hemoglobin: 8.9 g/dL — ABNORMAL LOW (ref 12.0–15.0)
MCH: 27.1 pg (ref 26.0–34.0)
MCHC: 29.6 g/dL — ABNORMAL LOW (ref 30.0–36.0)
MCV: 91.5 fL (ref 80.0–100.0)
Platelets: 239 10*3/uL (ref 150–400)
RBC: 3.29 MIL/uL — ABNORMAL LOW (ref 3.87–5.11)
RDW: 18.7 % — ABNORMAL HIGH (ref 11.5–15.5)
WBC: 8.6 10*3/uL (ref 4.0–10.5)
nRBC: 0 % (ref 0.0–0.2)

## 2019-11-24 LAB — RENAL FUNCTION PANEL
Albumin: 3.2 g/dL — ABNORMAL LOW (ref 3.5–5.0)
Anion gap: 16 — ABNORMAL HIGH (ref 5–15)
BUN: 63 mg/dL — ABNORMAL HIGH (ref 8–23)
CO2: 32 mmol/L (ref 22–32)
Calcium: 9.8 mg/dL (ref 8.9–10.3)
Chloride: 94 mmol/L — ABNORMAL LOW (ref 98–111)
Creatinine, Ser: 2 mg/dL — ABNORMAL HIGH (ref 0.44–1.00)
GFR, Estimated: 26 mL/min — ABNORMAL LOW (ref >60)
Glucose, Bld: 163 mg/dL — ABNORMAL HIGH (ref 70–99)
Phosphorus: 4.8 mg/dL — ABNORMAL HIGH (ref 2.5–4.6)
Potassium: 3.4 mmol/L — ABNORMAL LOW (ref 3.5–5.1)
Sodium: 142 mmol/L (ref 135–145)

## 2019-11-24 LAB — TRIGLYCERIDES: Triglycerides: 475 mg/dL — ABNORMAL HIGH (ref <150)

## 2019-11-24 LAB — MAGNESIUM: Magnesium: 1.9 mg/dL (ref 1.7–2.4)

## 2019-11-24 NOTE — Progress Notes (Signed)
Pulmonary Critical Care Medicine [facility]   PULMONARY CRITICAL CARE SERVICE  PROGRESS NOTE  Date of Service: 11/24/2019  [name]  ZJQ:[id number]  DOB: October 03, 1950   DOA: 11/05/2019  Referring Physician: Merton Border, MD  HPI: Veronica Melton is a 69 y.o. female seen for follow up of Acute on Chronic Respiratory Failure.  Patient now is on pressure control has been on 40% FiO2 with good saturations however has not been tolerating any type of weaning  Medications: Reviewed on Rounds  Physical Exam:  Vitals: Temperature 99.2 pulse one oh six respiratory twenty-three blood pressure is 156/69 saturations 100%  Ventilator Settings on pressure control FiO2 40% tidal volume 468 PEEP six IP twenty-one  . General: Comfortable at this time . Eyes: Grossly normal lids, irises & conjunctiva . ENT: grossly tongue is normal . Neck: no obvious mass . Cardiovascular: S1 S2 normal no gallop . Respiratory: No rhonchi no rales are noted at this time . Abdomen: soft . Skin: no rash seen on limited exam . Musculoskeletal: not rigid . Psychiatric:unable to assess . Neurologic: no seizure no involuntary movements         Lab Data:   Basic Metabolic Panel: Recent Labs  Lab 11/17/19 1211 11/18/19 0508 11/20/19 1038 11/21/19 0627 11/23/19 0446 11/24/19 0440  NA  --  140 146* 143 142 142  K  --  4.6 4.3 4.1 3.8 3.4*  CL  --  96* 95* 96* 94* 94*  CO2  --  28 30 31 31  32  GLUCOSE  --  191* 179* 196* 159* 163*  BUN  --  175* 136* 119* 77* 63*  CREATININE  --  3.31* 2.84* 2.96* 2.29* 2.00*  CALCIUM  --  9.1 9.3 9.4 9.7 9.8  MG 2.5* 2.5*  --  2.4 2.2 1.9  PHOS  --  6.6* 6.4* 6.4* 5.2* 4.8*    ABG: Recent Labs  Lab 11/17/19 1530 11/18/19 0458  PHART 7.306* 7.324*  PCO2ART 58.6* 58.0*  PO2ART 170* 86.6  HCO3 28.0 29.4*  O2SAT 99.0 96.0    Liver Function Tests: Recent Labs  Lab 11/18/19 0508 11/20/19 1038 11/21/19 0627 11/23/19 0446 11/24/19 0440   ALBUMIN 3.0* 3.3* 3.3* 3.3* 3.2*   No results for input(s): LIPASE, AMYLASE in the last 168 hours. No results for input(s): AMMONIA in the last 168 hours.  CBC: Recent Labs  Lab 11/18/19 0508 11/20/19 1038 11/21/19 0627 11/23/19 0446 11/24/19 0440  WBC 3.8* 8.3 6.7 9.0 8.6  HGB 8.2* 8.5* 9.2* 8.6* 8.9*  HCT 27.3* 27.5* 31.3* 29.1* 30.1*  MCV 91.6 89.9 90.5 91.5 91.5  PLT 133* 179 191 228 239    Cardiac Enzymes: No results for input(s): CKTOTAL, CKMB, CKMBINDEX, TROPONINI in the last 168 hours.  BNP (last 3 results) No results for input(s): BNP in the last 8760 hours.  ProBNP (last 3 results) No results for input(s): PROBNP in the last 8760 hours.  Radiological Exams: DG Chest Port 1 View  Result Date: 11/24/2019 CLINICAL DATA:  Pneumonia. EXAM: PORTABLE CHEST 1 VIEW COMPARISON:  Two days ago FINDINGS: Tracheostomy tube in place. Right IJ line with tip at the lower right atrium. Unchanged diffuse coarse interstitial opacity with fibrotic features on most recent chest CT. No acute opacity, visible effusion, or air leak. Cardiomegaly. IMPRESSION: Stable hardware positioning and pneumonia/fibrosis. Electronically Signed   By: Monte Fantasia M.D.   On: 11/24/2019 05:52    Assessment/Plan Active Problems:   Acute on chronic respiratory  failure with hypoxia (HCC)   End stage renal disease on dialysis ([place])   COVID-19 virus infection   Pneumonia due to COVID-19 virus   1. Acute on chronic respiratory failure hypoxia we will continue with pressure control FiO2 40% seems to be doing well with it right now 2. End-stage renal failure on hemodialysis continue with supportive care 3. COVID-19 virus infection in recovery 4. Pneumonia due to COVID-19 treated still with residual changes noted   I have personally seen and evaluated the patient, evaluated laboratory and imaging results, formulated the assessment and plan and placed orders. The Patient requires high complexity  decision making with multiple systems involvement.  Rounds were done with the Respiratory Therapy Director and Staff therapists and discussed with nursing staff also.  Allyne Gee, MD [facility] Pulmonary Critical Care Medicine Sleep Medicine

## 2019-11-24 NOTE — Progress Notes (Signed)
PROGRESS NOTE    Veronica Melton  XVQ:[id number] DOB: 15-Jan-1950 DOA: 11/05/2019   Brief Narrative:  Veronica Melton is an 69 y.o. female with medical history significant for chronic kidney disease, diabetes mellitus type 2 who was admitted to the outside hospital with worsening shortness of breath.  She was diagnosed with COVID-19 infection and pneumonia.  She received treatment with IV remdesivir, dexamethasone.  Patient also was found to have elevated procalcitonin with leukocytosis at the time of admission.  She was treated with empiric IV antibiotics.  She initially required continuous BiPAP due to respiratory decompensation.  Unfortunately she had worsening respiratory failure and had to be intubated.  Patient's hospital course complicated by worsening renal failure.  Nephrology was consulted.  She required hemodialysis, also required Levophed for hypotension.  She had episodes of fever.  She was treated with Diflucan because the cultures were growing yeast.  She remained on mechanical ventilation.  She received trach and PEG tube placement on 2019-10-27.  She started having fevers again and she was started on vancomycin, Zosyn for possible pneumonia.  Infectious disease was consulted and antibiotics were deescalated to Zosyn.  Patient remained encephalopathic. CT head on 2019-11-01 showed chronic small vessel changes.  Due to her complex medical problems she was transferred to [facility].  She had blood cultures from 2019-11-12 showing staph epidermidis.  She currently has a trach in place, on the vent.  She is on propofol and fentanyl.  She is not tolerating weaning.  Remains on 40% FiO2, 6 of PEEP.  Assessment & Plan: Active Problems:   Acute on chronic respiratory failure with hypoxia, ventilator dependent Bacteremia with Staphylococcus    COVID-19 virus infection   Pneumonia ARDS Acute on chronic renal failure  Pancytopenia, resolved UTI Encephalopathy Diabetes mellitus  type 2 Dysphagia/protein calorie malnutrition  Acute on chronic respiratory failure: Patient remains on the ventilator.  She is on 40% FiO2 with PEEP of 6.  Likely multifactorial etiology.  She had COVID-19 infection for which she was treated with remdesivir, Decadron.  She also developed secondary pneumonia and was treated with broad-spectrum antimicrobials.  Previous respiratory cultures from here showed yeast.  She received treatment with fluconazole as well.  There is also suspicion for ARDS secondary to COVID-19 infection.  She is not tolerating weaning. Pulmonary following.  Current supportive management.  She is currently on treatment with Zyvox for the gram-positive bacteremia with Staphylococcus.   She also has been started on empiric cefepime for systemic inflammatory response syndrome.  Tentative stop date 11/27/2019.  Fortunately repeat blood cultures from 11/20/2019 did not show any growth.  She also had repeat respiratory cultures collected on 11/20/2019 which do not show any organisms.  Bacteremia: Patient had blood cultures collected on 11/12/2019 that showed staph epidermidis in 1 of 4.  She has a dialysis catheter and a CVL.  She was previously on dialysis. Unfortunately we are unable to use IV Vancomycin here due to lack of resources to check the Vanco trough in a timely manner.  Because of high risk for Vanco toxicity she was switched to Zyvox.  Fortunately repeat blood cultures from 11/15/2019 and 11/20/2019 did not show any growth.  Tentative plan is for the Zyvox to be continued on 11/29/2019.    Previous lab work she was noted to have leukopenia and thrombocytopenia.  Exact etiology unclear at this time.  However, now counts improving.  COVID-19 infection: Patient was treated for COVID-19 infection at the outside facility.  Here she received  hydroxyurea.  Unfortunately remains on the ventilator.  There is also concern for ARDS from the COVID-19 infection.  Pulmonary following.   Antibiotics as mentioned above.  Continue to monitor closely.  Pneumonia: Patient was found to have healthcare associated pneumonia.  She had respiratory cultures from here that showed a few Candida albicans.  She was initially treated with broad-spectrum antimicrobial treatment with IV vancomycin, Zosyn and also treated with fluconazole.  Now on Zyvox for above-mentioned reasons.  She also has dysphagia therefore high risk for aspiration and recurrent aspiration pneumonia.  Acute on chronic renal failure: Patient was on dialysis.  Nephrology following.  Antibiotics renally dosed.  Pancytopenia: On lab work patient noted to be pancytopenic with leukopenia, thrombocytopenia, only since 11/16/2019 with an acute drop from 250-175 which is unusual to attribute to the antibiotics.  Exact etiology unclear at this time.  Counts improving.  UTI: She had urine cultures that showed yeast.  She already received treatment with fluconazole.  Diabetes mellitus type 2: Continue to monitor Accu-Cheks, medications and management of diabetes per primary team.  Encephalopathy: She has been started on acyclovir for probable viral encephalitis with a tentative stop date of 11/25/2019. Continue supportive management per primary team.    Dysphagia/protein calorie malnutrition: Unfortunately due to her dysphagia she is at risk for aspiration and aspiration pneumonia.  Management per primary team.  Unfortunately due to her complex medical problems she is very high risk for worsening and decompensation.  Plan of care discussed with the primary team and pharmacy.    Subjective: She remains encephalopathic.  She is not following any commands at this time.  Remains on propofol and fentanyl.  Not tolerating weaning.  Objective: Vitals: Temperature 100.1, pulse 98, respiratory rate 27, blood pressure 139/79, pulse oximetry 100% on 40% FiO2.  Examination: Constitutional: Ill-appearing female, not following  commands. Head: Atraumatic, normocephalic Eyes: PERLA ENMT: external ears and nose appear normal, Lips appears normal, moist oral mucosa  Neck: Has trach in place CVS: S1-S2 Respiratory: Coarse breath sounds, rhonchi Abdomen: Mildly distended, positive bowel sounds, PEG tube in place Musculoskeletal: Edema Neuro:  Remains encephalopathic, not following commands. Unable to do a neurologic exam. Psych: Unable to assess    Data Reviewed: I have personally reviewed following labs and imaging studies  CBC: Recent Labs  Lab 11/18/19 0508 11/20/19 1038 11/21/19 0627 11/23/19 0446 11/24/19 0440  WBC 3.8* 8.3 6.7 9.0 8.6  HGB 8.2* 8.5* 9.2* 8.6* 8.9*  HCT 27.3* 27.5* 31.3* 29.1* 30.1*  MCV 91.6 89.9 90.5 91.5 91.5  PLT 133* 179 191 228 710    Basic Metabolic Panel: Recent Labs  Lab 11/18/19 0508 11/20/19 1038 11/21/19 0627 11/23/19 0446 11/24/19 0440  NA 140 146* 143 142 142  K 4.6 4.3 4.1 3.8 3.4*  CL 96* 95* 96* 94* 94*  CO2 28 30 31 31  32  GLUCOSE 191* 179* 196* 159* 163*  BUN 175* 136* 119* 77* 63*  CREATININE 3.31* 2.84* 2.96* 2.29* 2.00*  CALCIUM 9.1 9.3 9.4 9.7 9.8  MG 2.5*  --  2.4 2.2 1.9  PHOS 6.6* 6.4* 6.4* 5.2* 4.8*    GFR: CrCl cannot be calculated (Unknown ideal weight.).  Liver Function Tests: Recent Labs  Lab 11/18/19 0508 11/20/19 1038 11/21/19 0627 11/23/19 0446 11/24/19 0440  ALBUMIN 3.0* 3.3* 3.3* 3.3* 3.2*    CBG: No results for input(s): GLUCAP in the last 168 hours.   Recent Results (from the past 240 hour(s))  Culture, blood (routine x 2)  Status: None   Collection Time: 11/15/19  3:24 PM   Specimen: BLOOD RIGHT HAND  Result Value Ref Range Status   Specimen Description BLOOD RIGHT HAND  Final   Special Requests   Final    BOTTLES DRAWN AEROBIC AND ANAEROBIC Blood Culture adequate volume   Culture   Final    NO GROWTH 5 DAYS Performed at [facility], [address], [place], [place] [address]    Report Status  11/20/2019 FINAL  Final  Culture, blood (routine x 2)     Status: None   Collection Time: 11/15/19  3:27 PM   Specimen: BLOOD LEFT HAND  Result Value Ref Range Status   Specimen Description BLOOD LEFT HAND  Final   Special Requests   Final    BOTTLES DRAWN AEROBIC AND ANAEROBIC Blood Culture adequate volume   Culture   Final    NO GROWTH 5 DAYS Performed at [facility], [place] [address] [place], [place] [address]    Report Status 11/20/2019 FINAL  Final  Culture, Urine     Status: None   Collection Time: 11/20/19  4:05 PM   Specimen: Urine, Random  Result Value Ref Range Status   Specimen Description URINE, RANDOM  Final   Special Requests NONE  Final   Culture   Final    NO GROWTH Performed at [facility], [place] [address], [place], [place] [address]    Report Status 11/21/2019 FINAL  Final  Culture, blood (routine x 2)     Status: None (Preliminary result)   Collection Time: 11/20/19  4:08 PM   Specimen: BLOOD  Result Value Ref Range Status   Specimen Description BLOOD SITE NOT SPECIFIED  Final   Special Requests   Final    BOTTLES DRAWN AEROBIC AND ANAEROBIC Blood Culture adequate volume   Culture   Final    NO GROWTH 4 DAYS Performed at [facility], [place] [address], [place], [place] [address]    Report Status PENDING  Incomplete  Culture, blood (routine x 2)     Status: None (Preliminary result)   Collection Time: 11/20/19  4:15 PM   Specimen: BLOOD  Result Value Ref Range Status   Specimen Description BLOOD SITE NOT SPECIFIED  Final   Special Requests   Final    BOTTLES DRAWN AEROBIC AND ANAEROBIC Blood Culture results may not be optimal due to an excessive volume of blood received in culture bottles   Culture   Final    NO GROWTH 4 DAYS Performed at [facility], [place] [address] [place], [place] [address]    Report Status PENDING  Incomplete  Culture, respiratory (non-expectorated)     Status: None   Collection Time: 11/20/19  6:43 PM    Specimen: Tracheal Aspirate; Respiratory  Result Value Ref Range Status   Specimen Description TRACHEAL ASPIRATE  Final   Special Requests NONE  Final   Gram Stain   Final    FEW WBC PRESENT,BOTH PMN AND MONONUCLEAR NO ORGANISMS SEEN Performed at [facility], [address], [place],  [address]    Culture RARE CANDIDA ALBICANS  Final   Report Status 11/24/2019 FINAL  Final      Radiology Studies: DG Chest Port 1 View  Result Date: 11/24/2019 CLINICAL DATA:  Pneumonia. EXAM: PORTABLE CHEST 1 VIEW COMPARISON:  Two days ago FINDINGS: Tracheostomy tube in place. Right IJ line with tip at the lower right  atrium. Unchanged diffuse coarse interstitial opacity with fibrotic features on most recent chest CT. No acute opacity, visible effusion, or air leak. Cardiomegaly. IMPRESSION: Stable hardware positioning and pneumonia/fibrosis. Electronically Signed   By: Monte Fantasia M.D.   On: 11/24/2019 05:52    Scheduled Meds: Please see MAR   Yaakov Guthrie, MD  11/24/2019, 4:06 PM

## 2019-11-25 DIAGNOSIS — J9621 Acute and chronic respiratory failure with hypoxia: Secondary | ICD-10-CM | POA: Diagnosis not present

## 2019-11-25 DIAGNOSIS — Z9911 Dependence on respirator [ventilator] status: Secondary | ICD-10-CM | POA: Diagnosis not present

## 2019-11-25 DIAGNOSIS — U071 COVID-19: Secondary | ICD-10-CM | POA: Diagnosis not present

## 2019-11-25 DIAGNOSIS — N186 End stage renal disease: Secondary | ICD-10-CM | POA: Diagnosis not present

## 2019-11-25 LAB — CBC
HCT: 28.2 % — ABNORMAL LOW (ref 36.0–46.0)
Hemoglobin: 8.4 g/dL — ABNORMAL LOW (ref 12.0–15.0)
MCH: 27.2 pg (ref 26.0–34.0)
MCHC: 29.8 g/dL — ABNORMAL LOW (ref 30.0–36.0)
MCV: 91.3 fL (ref 80.0–100.0)
Platelets: 209 10*3/uL (ref 150–400)
RBC: 3.09 MIL/uL — ABNORMAL LOW (ref 3.87–5.11)
RDW: 18.9 % — ABNORMAL HIGH (ref 11.5–15.5)
WBC: 8.6 10*3/uL (ref 4.0–10.5)
nRBC: 0 % (ref 0.0–0.2)

## 2019-11-25 LAB — RENAL FUNCTION PANEL
Albumin: 3.1 g/dL — ABNORMAL LOW (ref 3.5–5.0)
Anion gap: 15 (ref 5–15)
BUN: 54 mg/dL — ABNORMAL HIGH (ref 8–23)
CO2: 32 mmol/L (ref 22–32)
Calcium: 9.7 mg/dL (ref 8.9–10.3)
Chloride: 91 mmol/L — ABNORMAL LOW (ref 98–111)
Creatinine, Ser: 1.93 mg/dL — ABNORMAL HIGH (ref 0.44–1.00)
GFR, Estimated: 28 mL/min — ABNORMAL LOW (ref >60)
Glucose, Bld: 170 mg/dL — ABNORMAL HIGH (ref 70–99)
Phosphorus: 5 mg/dL — ABNORMAL HIGH (ref 2.5–4.6)
Potassium: 3.8 mmol/L (ref 3.5–5.1)
Sodium: 138 mmol/L (ref 135–145)

## 2019-11-25 LAB — CULTURE, BLOOD (ROUTINE X 2)
Culture: NO GROWTH
Culture: NO GROWTH
Special Requests: ADEQUATE

## 2019-11-25 LAB — MAGNESIUM: Magnesium: 1.9 mg/dL (ref 1.7–2.4)

## 2019-11-25 NOTE — Progress Notes (Signed)
[facility]  ROUNDING NOTE   Subjective:  Patient seen and evaluated at bedside. Renal function continues to improve. BUN down to 54 with a creatinine of 1.93.   Objective:  Vital signs in last 24 hours:  Temperature 98.2 pulse 92 respirations 30 blood pressure 112/81 Physical Exam: General:  Critically ill-appearing  Head:  Normocephalic, atraumatic. Moist oral mucosal membranes  Eyes:  Anicteric  Neck:  Tracheostomy in place  Lungs:   Scattered rhonchi, vent assisted  Heart:  S1S2 no rubs  Abdomen:   Soft, nontender, bowel sounds present  Extremities:  Trace peripheral edema.  Neurologic:  Sedated  Skin:  No acute rashes  Access:  No dialysis access    Basic Metabolic Panel: Recent Labs  Lab 11/20/19 1038 11/20/19 1038 11/21/19 0627 11/21/19 0627 11/23/19 0446 11/24/19 0440 11/25/19 0523  NA 146*  --  143  --  142 142 138  K 4.3  --  4.1  --  3.8 3.4* 3.8  CL 95*  --  96*  --  94* 94* 91*  CO2 30  --  31  --  31 32 32  GLUCOSE 179*  --  196*  --  159* 163* 170*  BUN 136*  --  119*  --  77* 63* 54*  CREATININE 2.84*  --  2.96*  --  2.29* 2.00* 1.93*  CALCIUM 9.3   < > 9.4   < > 9.7 9.8 9.7  MG  --   --  2.4  --  2.2 1.9 1.9  PHOS 6.4*  --  6.4*  --  5.2* 4.8* 5.0*   < > = values in this interval not displayed.    Liver Function Tests: Recent Labs  Lab 11/20/19 1038 11/21/19 0627 11/23/19 0446 11/24/19 0440 11/25/19 0523  ALBUMIN 3.3* 3.3* 3.3* 3.2* 3.1*   No results for input(s): LIPASE, AMYLASE in the last 168 hours. No results for input(s): AMMONIA in the last 168 hours.  CBC: Recent Labs  Lab 11/20/19 1038 11/21/19 0627 11/23/19 0446 11/24/19 0440 11/25/19 0523  WBC 8.3 6.7 9.0 8.6 8.6  HGB 8.5* 9.2* 8.6* 8.9* 8.4*  HCT 27.5* 31.3* 29.1* 30.1* 28.2*  MCV 89.9 90.5 91.5 91.5 91.3  PLT 179 191 228 239 209    Cardiac Enzymes: No results for input(s): CKTOTAL, CKMB, CKMBINDEX, TROPONINI in the last 168 hours.  BNP: Invalid  input(s): POCBNP  CBG: No results for input(s): GLUCAP in the last 168 hours.  Microbiology: Results for orders placed or performed during the hospital encounter of 11/05/19  Culture, respiratory (non-expectorated)     Status: None   Collection Time: 11/06/19  1:46 PM   Specimen: Tracheal Aspirate; Respiratory  Result Value Ref Range Status   Specimen Description TRACHEAL ASPIRATE  Final   Special Requests NONE  Final   Gram Stain   Final    RARE WBC PRESENT, PREDOMINANTLY PMN NO ORGANISMS SEEN    Culture   Final    RARE Normal respiratory flora-no Staph aureus or Pseudomonas seen Performed at [place] [address], [place], [place] [address]    Report Status 11/08/2019 FINAL  Final  Urine Culture     Status: Abnormal   Collection Time: 11/10/19  1:36 PM   Specimen: Urine, Random  Result Value Ref Range Status   Specimen Description URINE, RANDOM  Final   Special Requests   Final    NONE Performed at [facility], [place]  [address], [place], [place] [address]    Culture 60,000 COLONIES/mL YEAST (A)  Final   Report Status 11/11/2019 FINAL  Final  Culture, blood (routine x 2)     Status: None   Collection Time: 11/12/19 10:00 PM   Specimen: BLOOD  Result Value Ref Range Status   Specimen Description BLOOD SITE NOT SPECIFIED  Final   Special Requests   Final    BOTTLES DRAWN AEROBIC AND ANAEROBIC Blood Culture results may not be optimal due to an excessive volume of blood received in culture bottles   Culture   Final    NO GROWTH 5 DAYS Performed at [facility], [place] [address], [place], [place] [address]    Report Status 11/17/2019 FINAL  Final  Culture, blood (routine x 2)     Status: Abnormal   Collection Time: 11/12/19 10:06 PM   Specimen: BLOOD  Result Value Ref Range Status   Specimen Description BLOOD SITE NOT SPECIFIED  Final   Special Requests   Final    BOTTLES DRAWN AEROBIC AND ANAEROBIC Blood Culture results may not be optimal due to  an excessive volume of blood received in culture bottles   Culture  Setup Time   Final    GRAM POSITIVE COCCI ANAEROBIC BOTTLE ONLY CRITICAL RESULT CALLED TO, READ BACK BY AND VERIFIED WITH: RN J ALGHALI @0058  10/14/19 BY S GEZAHEGN  Performed at [facility], [place] [address], [place], [place] [address]    Culture STAPHYLOCOCCUS EPIDERMIDIS (A)  Final   Report Status 11/16/2019 FINAL  Final   Organism ID, Bacteria STAPHYLOCOCCUS EPIDERMIDIS  Final      Susceptibility   Staphylococcus epidermidis - MIC*    CIPROFLOXACIN >=8 RESISTANT Resistant     ERYTHROMYCIN >=8 RESISTANT Resistant     GENTAMICIN 8 INTERMEDIATE Intermediate     OXACILLIN >=4 RESISTANT Resistant     TETRACYCLINE 2 SENSITIVE Sensitive     VANCOMYCIN 1 SENSITIVE Sensitive     TRIMETH/SULFA 80 RESISTANT Resistant     CLINDAMYCIN >=8 RESISTANT Resistant     RIFAMPIN <=0.5 SENSITIVE Sensitive     Inducible Clindamycin NEGATIVE Sensitive     * STAPHYLOCOCCUS EPIDERMIDIS  Blood Culture ID Panel (Reflexed)     Status: Abnormal   Collection Time: 11/12/19 10:06 PM  Result Value Ref Range Status   Enterococcus faecalis NOT DETECTED NOT DETECTED Final   Enterococcus Faecium NOT DETECTED NOT DETECTED Final   Listeria monocytogenes NOT DETECTED NOT DETECTED Final   Staphylococcus species DETECTED (A) NOT DETECTED Final    Comment: CRITICAL RESULT CALLED TO, READ BACK BY AND VERIFIED WITH: RN J ALGHALI @0058  10/14/19 BY S GEZAHEGN     Staphylococcus aureus (BCID) NOT DETECTED NOT DETECTED Final   Staphylococcus epidermidis DETECTED (A) NOT DETECTED Final    Comment: Methicillin (oxacillin) resistant coagulase negative staphylococcus. Possible blood culture contaminant (unless isolated from more than one blood culture draw or clinical case suggests pathogenicity). No antibiotic treatment is indicated for blood  culture contaminants. CRITICAL RESULT CALLED TO, READ BACK BY AND VERIFIED WITH: RN J ALGHALI @0058  10/14/19 BY S  GEZAHEGN     Staphylococcus lugdunensis NOT DETECTED NOT DETECTED Final   Streptococcus species NOT DETECTED NOT DETECTED Final   Streptococcus agalactiae NOT DETECTED NOT DETECTED Final   Streptococcus pneumoniae NOT DETECTED NOT DETECTED Final   Streptococcus pyogenes NOT DETECTED NOT DETECTED Final   A.calcoaceticus-baumannii NOT DETECTED NOT DETECTED Final   Bacteroides fragilis NOT DETECTED NOT DETECTED  Final   Enterobacterales NOT DETECTED NOT DETECTED Final   Enterobacter cloacae complex NOT DETECTED NOT DETECTED Final   Escherichia coli NOT DETECTED NOT DETECTED Final   Klebsiella aerogenes NOT DETECTED NOT DETECTED Final   Klebsiella oxytoca NOT DETECTED NOT DETECTED Final   Klebsiella pneumoniae NOT DETECTED NOT DETECTED Final   Proteus species NOT DETECTED NOT DETECTED Final   Salmonella species NOT DETECTED NOT DETECTED Final   Serratia marcescens NOT DETECTED NOT DETECTED Final   Haemophilus influenzae NOT DETECTED NOT DETECTED Final   Neisseria meningitidis NOT DETECTED NOT DETECTED Final   Pseudomonas aeruginosa NOT DETECTED NOT DETECTED Final   Stenotrophomonas maltophilia NOT DETECTED NOT DETECTED Final   Candida albicans NOT DETECTED NOT DETECTED Final   Candida auris NOT DETECTED NOT DETECTED Final   Candida glabrata NOT DETECTED NOT DETECTED Final   Candida krusei NOT DETECTED NOT DETECTED Final   Candida parapsilosis NOT DETECTED NOT DETECTED Final   Candida tropicalis NOT DETECTED NOT DETECTED Final   Cryptococcus neoformans/gattii NOT DETECTED NOT DETECTED Final   Methicillin resistance mecA/C DETECTED (A) NOT DETECTED Final    Comment: CRITICAL RESULT CALLED TO, READ BACK BY AND VERIFIED WITH: RN J ALGHALI @0058  10/14/19 BY S GEZAHEGN  Performed at [facility], [address], [place], [place] [address]   Culture, respiratory (non-expectorated)     Status: None   Collection Time: 11/12/19 10:23 PM   Specimen: Tracheal Aspirate; Respiratory  Result  Value Ref Range Status   Specimen Description TRACHEAL ASPIRATE  Final   Special Requests NONE  Final   Gram Stain   Final    RARE WBC PRESENT,BOTH PMN AND MONONUCLEAR NO ORGANISMS SEEN Performed at [facility], [address], [place], [place] [address]    Culture FEW CANDIDA ALBICANS  Final   Report Status 11/15/2019 FINAL  Final  Culture, blood (routine x 2)     Status: None   Collection Time: 11/15/19  3:24 PM   Specimen: BLOOD RIGHT HAND  Result Value Ref Range Status   Specimen Description BLOOD RIGHT HAND  Final   Special Requests   Final    BOTTLES DRAWN AEROBIC AND ANAEROBIC Blood Culture adequate volume   Culture   Final    NO GROWTH 5 DAYS Performed at [facility], [place] [address], [place], [place] [address]    Report Status 11/20/2019 FINAL  Final  Culture, blood (routine x 2)     Status: None   Collection Time: 11/15/19  3:27 PM   Specimen: BLOOD LEFT HAND  Result Value Ref Range Status   Specimen Description BLOOD LEFT HAND  Final   Special Requests   Final    BOTTLES DRAWN AEROBIC AND ANAEROBIC Blood Culture adequate volume   Culture   Final    NO GROWTH 5 DAYS Performed at [facility], [place] [address], [place], [place] [address]    Report Status 11/20/2019 FINAL  Final  Culture, Urine     Status: None   Collection Time: 11/20/19  4:05 PM   Specimen: Urine, Random  Result Value Ref Range Status   Specimen Description URINE, RANDOM  Final   Special Requests NONE  Final   Culture   Final    NO GROWTH Performed at [facility], [place] [address] [place],  [address]    Report Status 11/21/2019 FINAL  Final  Culture, blood (routine x 2)     Status: None (  Preliminary result)   Collection Time: 11/20/19  4:08 PM   Specimen: BLOOD  Result Value Ref Range Status   Specimen Description BLOOD SITE NOT SPECIFIED  Final   Special Requests   Final    BOTTLES DRAWN AEROBIC AND ANAEROBIC Blood Culture adequate volume   Culture   Final     NO GROWTH 4 DAYS Performed at [facility], [address] [place], [place] [address]    Report Status PENDING  Incomplete  Culture, blood (routine x 2)     Status: None (Preliminary result)   Collection Time: 11/20/19  4:15 PM   Specimen: BLOOD  Result Value Ref Range Status   Specimen Description BLOOD SITE NOT SPECIFIED  Final   Special Requests   Final    BOTTLES DRAWN AEROBIC AND ANAEROBIC Blood Culture results may not be optimal due to an excessive volume of blood received in culture bottles   Culture   Final    NO GROWTH 4 DAYS Performed at [facility], [place] [address], [place], [place] [address]    Report Status PENDING  Incomplete  Culture, respiratory (non-expectorated)     Status: None   Collection Time: 11/20/19  6:43 PM   Specimen: Tracheal Aspirate; Respiratory  Result Value Ref Range Status   Specimen Description TRACHEAL ASPIRATE  Final   Special Requests NONE  Final   Gram Stain   Final    FEW WBC PRESENT,BOTH PMN AND MONONUCLEAR NO ORGANISMS SEEN Performed at [facility], [address], [place], [place] [address]    Culture RARE CANDIDA ALBICANS  Final   Report Status 11/24/2019 FINAL  Final    Coagulation Studies: No results for input(s): LABPROT, INR in the last 72 hours.  Urinalysis: No results for input(s): COLORURINE, LABSPEC, PHURINE, GLUCOSEU, HGBUR, BILIRUBINUR, KETONESUR, PROTEINUR, UROBILINOGEN, NITRITE, LEUKOCYTESUR in the last 72 hours.  Invalid input(s): APPERANCEUR    Imaging: DG Chest Port 1 View  Result Date: 11/24/2019 CLINICAL DATA:  Pneumonia. EXAM: PORTABLE CHEST 1 VIEW COMPARISON:  Two days ago FINDINGS: Tracheostomy tube in place. Right IJ line with tip at the lower right atrium. Unchanged diffuse coarse interstitial opacity with fibrotic features on most recent chest CT. No acute opacity, visible effusion, or air leak. Cardiomegaly. IMPRESSION: Stable hardware positioning and pneumonia/fibrosis.  Electronically Signed   By: Monte Fantasia M.D.   On: 11/24/2019 05:52     Medications:       Assessment/ Plan:  69 y.o. female with a PMHx of diabetes mellitus type 2, hyperlipidemia, plantar fasciitis, B12 deficiency, chronic low back pain, restless leg syndrome, GERD, nonalcoholic fatty liver disease, anxiety, nephrolithiasis, irritable bowel syndrome, hypertension, chronic kidney disease stage IIIa, who was admitted to [facility] on 11/05/2019 for ongoing treatment of post COVID-19 recovery.  1.  Acute kidney injury/chronic kidney disease stage IIIa.    Creatinine continues to improve.  Creatinine currently down to 1.93.  BUN also down to 54.  Avoid nephrotoxins as possible.  2.  Acute respiratory failure secondary to COVID-19 infection.    Still requiring ventilatory support.  Maintain current ventilatory support at this time.  3.  Anemia of chronic kidney disease.    Hemoglobin 8.4 at the moment.  Continue to monitor.  4.  Secondary hyperparathyroidism.  Phosphorus up a bit to 5.0 but within range.  Continue to monitor.  5.  Bacteremia.  1 or 2 culture shows Staph epidermidis.  Dialysis catheter was removed.    LOS:  0 Arpan Eskelson 11/12/20217:46 AM

## 2019-11-25 NOTE — Progress Notes (Signed)
Pulmonary Critical Care Medicine [place]   PULMONARY CRITICAL CARE SERVICE  PROGRESS NOTE  Date of Service: 11/25/2019  Veronica Melton  YKD:[id number]  DOB: October 10, 1950   DOA: 11/05/2019  Referring Physician: Merton Border, MD  HPI: Veronica Melton is a 69 y.o. female seen for follow up of Acute on Chronic Respiratory Failure.  Patient currently is on pressure assist control mode has been on 40% FiO2.  She is required increased sedation.  Patient continues to be seen by nephrology renal function overall does appear to be improving however she still has significant encephalopathy.  Medications: Reviewed on Rounds  Physical Exam:  Vitals: Temperature is 98.7 pulse 93 respiratory rate 33 blood pressure is 102/81 saturations 96%  Ventilator Settings on pressure assist control FiO2 is 40% tidal volume 477 IP 21 PEEP 6  . General: Comfortable at this time . Eyes: Grossly normal lids, irises & conjunctiva . ENT: grossly tongue is normal . Neck: no obvious mass . Cardiovascular: S1 S2 normal no gallop . Respiratory: Scattered rhonchi expansion is equal . Abdomen: soft . Skin: no rash seen on limited exam . Musculoskeletal: not rigid . Psychiatric:unable to assess . Neurologic: no seizure no involuntary movements         Lab Data:   Basic Metabolic Panel: Recent Labs  Lab 11/20/19 1038 11/21/19 0627 11/23/19 0446 11/24/19 0440 11/25/19 0523  NA 146* 143 142 142 138  K 4.3 4.1 3.8 3.4* 3.8  CL 95* 96* 94* 94* 91*  CO2 30 31 31  32 32  GLUCOSE 179* 196* 159* 163* 170*  BUN 136* 119* 77* 63* 54*  CREATININE 2.84* 2.96* 2.29* 2.00* 1.93*  CALCIUM 9.3 9.4 9.7 9.8 9.7  MG  --  2.4 2.2 1.9 1.9  PHOS 6.4* 6.4* 5.2* 4.8* 5.0*    ABG: No results for input(s): PHART, PCO2ART, PO2ART, HCO3, O2SAT in the last 168 hours.  Liver Function Tests: Recent Labs  Lab 11/20/19 1038 11/21/19 0627 11/23/19 0446 11/24/19 0440 11/25/19 0523  ALBUMIN 3.3* 3.3* 3.3*  3.2* 3.1*   No results for input(s): LIPASE, AMYLASE in the last 168 hours. No results for input(s): AMMONIA in the last 168 hours.  CBC: Recent Labs  Lab 11/20/19 1038 11/21/19 0627 11/23/19 0446 11/24/19 0440 11/25/19 0523  WBC 8.3 6.7 9.0 8.6 8.6  HGB 8.5* 9.2* 8.6* 8.9* 8.4*  HCT 27.5* 31.3* 29.1* 30.1* 28.2*  MCV 89.9 90.5 91.5 91.5 91.3  PLT 179 191 228 239 209    Cardiac Enzymes: No results for input(s): CKTOTAL, CKMB, CKMBINDEX, TROPONINI in the last 168 hours.  BNP (last 3 results) No results for input(s): BNP in the last 8760 hours.  ProBNP (last 3 results) No results for input(s): PROBNP in the last 8760 hours.  Radiological Exams: DG Chest Port 1 View  Result Date: 11/24/2019 CLINICAL DATA:  Pneumonia. EXAM: PORTABLE CHEST 1 VIEW COMPARISON:  Two days ago FINDINGS: Tracheostomy tube in place. Right IJ line with tip at the lower right atrium. Unchanged diffuse coarse interstitial opacity with fibrotic features on most recent chest CT. No acute opacity, visible effusion, or air leak. Cardiomegaly. IMPRESSION: Stable hardware positioning and pneumonia/fibrosis. Electronically Signed   By: Monte Fantasia M.D.   On: 11/24/2019 05:52    Assessment/Plan Active Problems:   Acute on chronic respiratory failure with hypoxia (HCC)   End stage renal disease on dialysis ([place])   COVID-19 virus infection   Pneumonia due to COVID-19 virus   1. Acute on  chronic respiratory failure with hypoxia we will continue with pressure control mode titrate oxygen continue pulmonary toilet.  Mechanics are not feasible for weaning and she is also on heavy sedation at this point because of encephalopathy 2. Acute renal failure renal function is improving nephrology following 3. COVID-19 virus infection in recovery 4. Pneumonia due to COVID-19 has been treated we will continue to follow.   I have personally seen and evaluated the patient, evaluated laboratory and imaging results,  formulated the assessment and plan and placed orders. The Patient requires high complexity decision making with multiple systems involvement.  Rounds were done with the Respiratory Therapy Director and Staff therapists and discussed with nursing staff also.  Allyne Gee, MD [facility] Pulmonary Critical Care Medicine Sleep Medicine

## 2019-11-26 ENCOUNTER — Other Ambulatory Visit (HOSPITAL_COMMUNITY): Payer: Medicare Other

## 2019-11-26 DIAGNOSIS — J9621 Acute and chronic respiratory failure with hypoxia: Secondary | ICD-10-CM | POA: Diagnosis not present

## 2019-11-26 DIAGNOSIS — N186 End stage renal disease: Secondary | ICD-10-CM | POA: Diagnosis not present

## 2019-11-26 DIAGNOSIS — U071 COVID-19: Secondary | ICD-10-CM | POA: Diagnosis not present

## 2019-11-26 DIAGNOSIS — Z9911 Dependence on respirator [ventilator] status: Secondary | ICD-10-CM | POA: Diagnosis not present

## 2019-11-26 LAB — CBC
HCT: 28.8 % — ABNORMAL LOW (ref 36.0–46.0)
Hemoglobin: 8.7 g/dL — ABNORMAL LOW (ref 12.0–15.0)
MCH: 27.2 pg (ref 26.0–34.0)
MCHC: 30.2 g/dL (ref 30.0–36.0)
MCV: 90 fL (ref 80.0–100.0)
Platelets: 209 10*3/uL (ref 150–400)
RBC: 3.2 MIL/uL — ABNORMAL LOW (ref 3.87–5.11)
RDW: 19.4 % — ABNORMAL HIGH (ref 11.5–15.5)
WBC: 7.8 10*3/uL (ref 4.0–10.5)
nRBC: 0 % (ref 0.0–0.2)

## 2019-11-26 LAB — RENAL FUNCTION PANEL
Albumin: 2.9 g/dL — ABNORMAL LOW (ref 3.5–5.0)
Anion gap: 16 — ABNORMAL HIGH (ref 5–15)
BUN: 56 mg/dL — ABNORMAL HIGH (ref 8–23)
CO2: 32 mmol/L (ref 22–32)
Calcium: 9.5 mg/dL (ref 8.9–10.3)
Chloride: 90 mmol/L — ABNORMAL LOW (ref 98–111)
Creatinine, Ser: 1.78 mg/dL — ABNORMAL HIGH (ref 0.44–1.00)
GFR, Estimated: 30 mL/min — ABNORMAL LOW (ref >60)
Glucose, Bld: 161 mg/dL — ABNORMAL HIGH (ref 70–99)
Phosphorus: 5.6 mg/dL — ABNORMAL HIGH (ref 2.5–4.6)
Potassium: 3 mmol/L — ABNORMAL LOW (ref 3.5–5.1)
Sodium: 138 mmol/L (ref 135–145)

## 2019-11-26 LAB — MAGNESIUM: Magnesium: 1.7 mg/dL (ref 1.7–2.4)

## 2019-11-26 NOTE — Progress Notes (Signed)
Pulmonary Critical Care Medicine [name]   PULMONARY CRITICAL CARE SERVICE  PROGRESS NOTE  Date of Service: 11/26/2019  Veronica Melton  OIZ:[id number]  DOB: 05/20/1950   DOA: 11/05/2019  Referring Physician: Merton Border, MD  HPI: Veronica Melton is a 69 y.o. female seen for follow up of Acute on Chronic Respiratory Failure.  Patient currently is on pressure assist control mode has been on 40% FiO2 currently tidal volumes of 527  Medications: Reviewed on Rounds  Physical Exam:  Vitals: Temperature 98.2 pulse 100 respiratory rate 20 blood pressure is 130/51 saturations 98%  Ventilator Settings on pressure assist control FiO2 40% tidal volume 06/09/1919 PEEP 6  . General: Comfortable at this time . Eyes: Grossly normal lids, irises & conjunctiva . ENT: grossly tongue is normal . Neck: no obvious mass . Cardiovascular: S1 S2 normal no gallop . Respiratory: No rhonchi no rales noted at this time . Abdomen: soft . Skin: no rash seen on limited exam . Musculoskeletal: not rigid . Psychiatric:unable to assess . Neurologic: no seizure no involuntary movements         Lab Data:   Basic Metabolic Panel: Recent Labs  Lab 11/21/19 0627 11/23/19 0446 11/24/19 0440 11/25/19 0523 11/26/19 0609  NA 143 142 142 138 138  K 4.1 3.8 3.4* 3.8 3.0*  CL 96* 94* 94* 91* 90*  CO2 31 31 32 32 32  GLUCOSE 196* 159* 163* 170* 161*  BUN 119* 77* 63* 54* 56*  CREATININE 2.96* 2.29* 2.00* 1.93* 1.78*  CALCIUM 9.4 9.7 9.8 9.7 9.5  MG 2.4 2.2 1.9 1.9 1.7  PHOS 6.4* 5.2* 4.8* 5.0* 5.6*    ABG: No results for input(s): PHART, PCO2ART, PO2ART, HCO3, O2SAT in the last 168 hours.  Liver Function Tests: Recent Labs  Lab 11/21/19 0627 11/23/19 0446 11/24/19 0440 11/25/19 0523 11/26/19 0609  ALBUMIN 3.3* 3.3* 3.2* 3.1* 2.9*   No results for input(s): LIPASE, AMYLASE in the last 168 hours. No results for input(s): AMMONIA in the last 168 hours.  CBC: Recent Labs   Lab 11/21/19 0627 11/23/19 0446 11/24/19 0440 11/25/19 0523 11/26/19 0609  WBC 6.7 9.0 8.6 8.6 7.8  HGB 9.2* 8.6* 8.9* 8.4* 8.7*  HCT 31.3* 29.1* 30.1* 28.2* 28.8*  MCV 90.5 91.5 91.5 91.3 90.0  PLT 191 228 239 209 209    Cardiac Enzymes: No results for input(s): CKTOTAL, CKMB, CKMBINDEX, TROPONINI in the last 168 hours.  BNP (last 3 results) No results for input(s): BNP in the last 8760 hours.  ProBNP (last 3 results) No results for input(s): PROBNP in the last 8760 hours.  Radiological Exams: DG CHEST PORT 1 VIEW  Result Date: 11/26/2019 CLINICAL DATA:  Pneumonia EXAM: PORTABLE CHEST 1 VIEW COMPARISON:  11/24/2019 FINDINGS: Tracheostomy tube tip is at the level of the clavicular heads. Right internal jugular vein approach central venous catheter tip is at the cavoatrial junction. Multifocal bilateral airspace opacities. IMPRESSION: Multifocal bilateral airspace opacities, consistent with multifocal pneumonia. Electronically Signed   By: Ulyses Jarred M.D.   On: 11/26/2019 05:43    Assessment/Plan Active Problems:   Acute on chronic respiratory failure with hypoxia (HCC)   End stage renal disease on dialysis ([place])   COVID-19 virus infection   Pneumonia due to COVID-19 virus   1. Acute on chronic respiratory failure hypoxia continues to fail attempts at weaning right now is on full support pressure control mode oxygen is 1040%. 2. End-stage renal failure patient has been seen by nephrology  currently making urine 3. COVID-19 virus infection being followed by infectious disease we will continue with their recommendations 4. Pneumonia due to COVID-19 treated continue to follow chest x-ray still showing multifocal disease   I have personally seen and evaluated the patient, evaluated laboratory and imaging results, formulated the assessment and plan and placed orders. The Patient requires high complexity decision making with multiple systems involvement.  Rounds were done  with the Respiratory Therapy Director and Staff therapists and discussed with nursing staff also.  Allyne Gee, MD [facility] Pulmonary Critical Care Medicine Sleep Medicine

## 2019-11-27 DIAGNOSIS — Z9911 Dependence on respirator [ventilator] status: Secondary | ICD-10-CM | POA: Diagnosis not present

## 2019-11-27 DIAGNOSIS — U071 COVID-19: Secondary | ICD-10-CM | POA: Diagnosis not present

## 2019-11-27 DIAGNOSIS — N186 End stage renal disease: Secondary | ICD-10-CM | POA: Diagnosis not present

## 2019-11-27 DIAGNOSIS — J9621 Acute and chronic respiratory failure with hypoxia: Secondary | ICD-10-CM | POA: Diagnosis not present

## 2019-11-27 LAB — TRIGLYCERIDES: Triglycerides: 615 mg/dL — ABNORMAL HIGH (ref <150)

## 2019-11-27 LAB — RENAL FUNCTION PANEL
Albumin: 3 g/dL — ABNORMAL LOW (ref 3.5–5.0)
Anion gap: 15 (ref 5–15)
BUN: 67 mg/dL — ABNORMAL HIGH (ref 8–23)
CO2: 31 mmol/L (ref 22–32)
Calcium: 9.5 mg/dL (ref 8.9–10.3)
Chloride: 90 mmol/L — ABNORMAL LOW (ref 98–111)
Creatinine, Ser: 1.66 mg/dL — ABNORMAL HIGH (ref 0.44–1.00)
GFR, Estimated: 33 mL/min — ABNORMAL LOW (ref >60)
Glucose, Bld: 153 mg/dL — ABNORMAL HIGH (ref 70–99)
Phosphorus: 4.7 mg/dL — ABNORMAL HIGH (ref 2.5–4.6)
Potassium: 4.1 mmol/L (ref 3.5–5.1)
Sodium: 136 mmol/L (ref 135–145)

## 2019-11-27 LAB — MAGNESIUM: Magnesium: 2.4 mg/dL (ref 1.7–2.4)

## 2019-11-27 NOTE — Progress Notes (Signed)
Pulmonary Critical Care Medicine [place]   PULMONARY CRITICAL CARE SERVICE  PROGRESS NOTE  Date of Service: 11/27/2019  Veronica Melton  NUU:[id number]  DOB: Dec 12, 1950   DOA: 11/05/2019  Referring Physician: Merton Border, MD  HPI: Takari Duncombe is a 69 y.o. female seen for follow up of Acute on Chronic Respiratory Failure. Patient currently is on pressure control mode has been on 40% FiO2  Medications: Reviewed on Rounds  Physical Exam:  Vitals: Temperature is 98.4 pulse 91 respiratory rate 30 blood pressure is 114/70 saturations 99%  Ventilator Settings on pressure assist control FiO2 40% IP 21 tidal volume 518 PEEP 6  . General: Comfortable at this time . Eyes: Grossly normal lids, irises & conjunctiva . ENT: grossly tongue is normal . Neck: no obvious mass . Cardiovascular: S1 S2 normal no gallop . Respiratory: No rhonchi no rales are noted at this time . Abdomen: soft . Skin: no rash seen on limited exam . Musculoskeletal: not rigid . Psychiatric:unable to assess . Neurologic: no seizure no involuntary movements         Lab Data:   Basic Metabolic Panel: Recent Labs  Lab 11/23/19 0446 11/24/19 0440 11/25/19 0523 11/26/19 0609 11/27/19 0459  NA 142 142 138 138 136  K 3.8 3.4* 3.8 3.0* 4.1  CL 94* 94* 91* 90* 90*  CO2 31 32 32 32 31  GLUCOSE 159* 163* 170* 161* 153*  BUN 77* 63* 54* 56* 67*  CREATININE 2.29* 2.00* 1.93* 1.78* 1.66*  CALCIUM 9.7 9.8 9.7 9.5 9.5  MG 2.2 1.9 1.9 1.7 2.4  PHOS 5.2* 4.8* 5.0* 5.6* 4.7*    ABG: No results for input(s): PHART, PCO2ART, PO2ART, HCO3, O2SAT in the last 168 hours.  Liver Function Tests: Recent Labs  Lab 11/23/19 0446 11/24/19 0440 11/25/19 0523 11/26/19 0609 11/27/19 0459  ALBUMIN 3.3* 3.2* 3.1* 2.9* 3.0*   No results for input(s): LIPASE, AMYLASE in the last 168 hours. No results for input(s): AMMONIA in the last 168 hours.  CBC: Recent Labs  Lab 11/21/19 0627 11/23/19 0446  11/24/19 0440 11/25/19 0523 11/26/19 0609  WBC 6.7 9.0 8.6 8.6 7.8  HGB 9.2* 8.6* 8.9* 8.4* 8.7*  HCT 31.3* 29.1* 30.1* 28.2* 28.8*  MCV 90.5 91.5 91.5 91.3 90.0  PLT 191 228 239 209 209    Cardiac Enzymes: No results for input(s): CKTOTAL, CKMB, CKMBINDEX, TROPONINI in the last 168 hours.  BNP (last 3 results) No results for input(s): BNP in the last 8760 hours.  ProBNP (last 3 results) No results for input(s): PROBNP in the last 8760 hours.  Radiological Exams: DG CHEST PORT 1 VIEW  Result Date: 11/26/2019 CLINICAL DATA:  Pneumonia EXAM: PORTABLE CHEST 1 VIEW COMPARISON:  11/24/2019 FINDINGS: Tracheostomy tube tip is at the level of the clavicular heads. Right internal jugular vein approach central venous catheter tip is at the cavoatrial junction. Multifocal bilateral airspace opacities. IMPRESSION: Multifocal bilateral airspace opacities, consistent with multifocal pneumonia. Electronically Signed   By: Ulyses Jarred M.D.   On: 11/26/2019 05:43    Assessment/Plan Active Problems:   Acute on chronic respiratory failure with hypoxia (HCC)   End stage renal disease on dialysis ([place])   COVID-19 virus infection   Pneumonia due to COVID-19 virus   1. Acute on chronic respiratory failure hypoxia we will continue with pressure control patient's oxygen requirements are still high she remains fully vent dependent. 2. End-stage renal failure on hemodialysis 3. COVID-19 virus infection in resolution 4. Pneumonia  due to COVID-19 being seen by infectious disease we will continue with supportive care chest x-ray still showing multifocal airspace disease   I have personally seen and evaluated the patient, evaluated laboratory and imaging results, formulated the assessment and plan and placed orders. The Patient requires high complexity decision making with multiple systems involvement.  Rounds were done with the Respiratory Therapy Director and Staff therapists and discussed with  nursing staff also.  Allyne Gee, MD [facility] Pulmonary Critical Care Medicine Sleep Medicine

## 2019-11-28 ENCOUNTER — Other Ambulatory Visit (HOSPITAL_COMMUNITY): Payer: Medicare Other

## 2019-11-28 DIAGNOSIS — J9621 Acute and chronic respiratory failure with hypoxia: Secondary | ICD-10-CM | POA: Diagnosis not present

## 2019-11-28 DIAGNOSIS — N186 End stage renal disease: Secondary | ICD-10-CM | POA: Diagnosis not present

## 2019-11-28 DIAGNOSIS — U071 COVID-19: Secondary | ICD-10-CM | POA: Diagnosis not present

## 2019-11-28 DIAGNOSIS — Z9911 Dependence on respirator [ventilator] status: Secondary | ICD-10-CM | POA: Diagnosis not present

## 2019-11-28 LAB — RENAL FUNCTION PANEL
Albumin: 3.1 g/dL — ABNORMAL LOW (ref 3.5–5.0)
Anion gap: 19 — ABNORMAL HIGH (ref 5–15)
BUN: 82 mg/dL — ABNORMAL HIGH (ref 8–23)
CO2: 29 mmol/L (ref 22–32)
Calcium: 9.8 mg/dL (ref 8.9–10.3)
Chloride: 84 mmol/L — ABNORMAL LOW (ref 98–111)
Creatinine, Ser: 1.84 mg/dL — ABNORMAL HIGH (ref 0.44–1.00)
GFR, Estimated: 29 mL/min — ABNORMAL LOW (ref >60)
Glucose, Bld: 166 mg/dL — ABNORMAL HIGH (ref 70–99)
Phosphorus: 5.2 mg/dL — ABNORMAL HIGH (ref 2.5–4.6)
Potassium: 4.2 mmol/L (ref 3.5–5.1)
Sodium: 132 mmol/L — ABNORMAL LOW (ref 135–145)

## 2019-11-28 LAB — CBC
HCT: 30.9 % — ABNORMAL LOW (ref 36.0–46.0)
Hemoglobin: 9.4 g/dL — ABNORMAL LOW (ref 12.0–15.0)
MCH: 27.2 pg (ref 26.0–34.0)
MCHC: 30.4 g/dL (ref 30.0–36.0)
MCV: 89.6 fL (ref 80.0–100.0)
Platelets: 261 10*3/uL (ref 150–400)
RBC: 3.45 MIL/uL — ABNORMAL LOW (ref 3.87–5.11)
RDW: 20.2 % — ABNORMAL HIGH (ref 11.5–15.5)
WBC: 9.8 10*3/uL (ref 4.0–10.5)
nRBC: 0 % (ref 0.0–0.2)

## 2019-11-28 LAB — MAGNESIUM: Magnesium: 2.2 mg/dL (ref 1.7–2.4)

## 2019-11-28 NOTE — Progress Notes (Signed)
Pulmonary Critical Care Medicine [place]   PULMONARY CRITICAL CARE SERVICE  PROGRESS NOTE  Date of Service: 11/28/2019  Veronica Melton  VEL:[id number]  DOB: 02-06-1950   DOA: 11/05/2019  Referring Physician: Merton Border, MD  HPI: Veronica Melton is a 69 y.o. female seen for follow up of Acute on Chronic Respiratory Failure.  Patient currently is on pressure control mode has been on 40% FiO2 has a lot of abnormal movements writhing in bed kicking legs.  Review of her medication shows she is on amantadine as well as Zoloft  Medications: Reviewed on Rounds  Physical Exam:  Vitals: Temperature is 96.8 pulse 102 respiratory rate 34 blood pressure is 104/73 saturations 95%  Ventilator Settings on pressure assist control FiO2 40% tidal volume 656 IP 21 PEEP 6  . General: Comfortable at this time . Eyes: Grossly normal lids, irises & conjunctiva . ENT: grossly tongue is normal . Neck: no obvious mass . Cardiovascular: S1 S2 normal no gallop . Respiratory: No rhonchi no rales noted at this time . Abdomen: soft . Skin: no rash seen on limited exam . Musculoskeletal: not rigid . Psychiatric:unable to assess . Neurologic: no seizure no involuntary movements         Lab Data:   Basic Metabolic Panel: Recent Labs  Lab 11/23/19 0446 11/24/19 0440 11/25/19 0523 11/26/19 0609 11/27/19 0459  NA 142 142 138 138 136  K 3.8 3.4* 3.8 3.0* 4.1  CL 94* 94* 91* 90* 90*  CO2 31 32 32 32 31  GLUCOSE 159* 163* 170* 161* 153*  BUN 77* 63* 54* 56* 67*  CREATININE 2.29* 2.00* 1.93* 1.78* 1.66*  CALCIUM 9.7 9.8 9.7 9.5 9.5  MG 2.2 1.9 1.9 1.7 2.4  PHOS 5.2* 4.8* 5.0* 5.6* 4.7*    ABG: No results for input(s): PHART, PCO2ART, PO2ART, HCO3, O2SAT in the last 168 hours.  Liver Function Tests: Recent Labs  Lab 11/23/19 0446 11/24/19 0440 11/25/19 0523 11/26/19 0609 11/27/19 0459  ALBUMIN 3.3* 3.2* 3.1* 2.9* 3.0*   No results for input(s): LIPASE, AMYLASE in the  last 168 hours. No results for input(s): AMMONIA in the last 168 hours.  CBC: Recent Labs  Lab 11/23/19 0446 11/24/19 0440 11/25/19 0523 11/26/19 0609 11/28/19 0647  WBC 9.0 8.6 8.6 7.8 9.8  HGB 8.6* 8.9* 8.4* 8.7* 9.4*  HCT 29.1* 30.1* 28.2* 28.8* 30.9*  MCV 91.5 91.5 91.3 90.0 89.6  PLT 228 239 209 209 261    Cardiac Enzymes: No results for input(s): CKTOTAL, CKMB, CKMBINDEX, TROPONINI in the last 168 hours.  BNP (last 3 results) No results for input(s): BNP in the last 8760 hours.  ProBNP (last 3 results) No results for input(s): PROBNP in the last 8760 hours.  Radiological Exams: DG CHEST PORT 1 VIEW  Result Date: 11/28/2019 CLINICAL DATA:  Respiratory failure EXAM: PORTABLE CHEST 1 VIEW COMPARISON:  11/26/2019 FINDINGS: Tracheostomy and right internal jugular central venous catheter with its tip within the deep right atrium are unchanged. Lung volumes are small, but are stable since prior examination. Superimposed asymmetric pulmonary infiltrate diffusely through the left lung and within the right mid lung zone appears stable since prior examination, likely infectious or inflammatory. No pneumothorax or pleural effusion. Cardiac size within normal limits. Pulmonary vascularity is normal. IMPRESSION: Stable pulmonary insufflation. Stable superimposed asymmetric pulmonary infiltrate, likely infectious or inflammatory. Stable support lines and tubes. Electronically Signed   By: Fidela Salisbury MD   On: 11/28/2019 06:13    Assessment/Plan  Active Problems:   Acute on chronic respiratory failure with hypoxia (HCC)   End stage renal disease on dialysis ([place])   COVID-19 virus infection   Pneumonia due to COVID-19 virus   1. Acute on chronic respiratory failure hypoxia continues on the ventilator and full support on pressure control mode respiratory therapy is going to check the mechanics today see if she is able to wean. 2. End-stage renal failure on hemodialysis patient is on  Lasix drip been seen by nephrology 3. COVID-19 virus infection in recovery 4. Abnormal trunk and leg movements consider stopping the Zoloft and amantadine 5. Pneumonia due to COVID-19 treated we will continue with supportive care   I have personally seen and evaluated the patient, evaluated laboratory and imaging results, formulated the assessment and plan and placed orders. The Patient requires high complexity decision making with multiple systems involvement.  Rounds were done with the Respiratory Therapy Director and Staff therapists and discussed with nursing staff also.  Allyne Gee, MD [facility] Pulmonary Critical Care Medicine Sleep Medicine

## 2019-11-28 NOTE — Progress Notes (Signed)
[facility]  ROUNDING NOTE   Subjective:  Patient resting comfortably at bedside. Remains on Lasix drip. Creatinine 1.6.   Objective:  Vital signs in last 24 hours:  Temperature 96.8 pulse 102 respiration 34 blood pressure 104/73  Physical Exam: General:  Critically ill-appearing  Head:  Normocephalic, atraumatic. Moist oral mucosal membranes  Eyes:  Anicteric  Neck:  Tracheostomy in place  Lungs:   Scattered rhonchi, vent assisted  Heart:  S1S2 no rubs  Abdomen:   Soft, nontender, bowel sounds present  Extremities:  Trace peripheral edema.  Neurologic:  Sedated  Skin:  No acute rashes  Access:  No dialysis access    Basic Metabolic Panel: Recent Labs  Lab 11/23/19 0446 11/23/19 0446 11/24/19 0440 11/24/19 0440 11/25/19 0523 11/26/19 0609 11/27/19 0459  NA 142  --  142  --  138 138 136  K 3.8  --  3.4*  --  3.8 3.0* 4.1  CL 94*  --  94*  --  91* 90* 90*  CO2 31  --  32  --  32 32 31  GLUCOSE 159*  --  163*  --  170* 161* 153*  BUN 77*  --  63*  --  54* 56* 67*  CREATININE 2.29*  --  2.00*  --  1.93* 1.78* 1.66*  CALCIUM 9.7   < > 9.8   < > 9.7 9.5 9.5  MG 2.2  --  1.9  --  1.9 1.7 2.4  PHOS 5.2*  --  4.8*  --  5.0* 5.6* 4.7*   < > = values in this interval not displayed.    Liver Function Tests: Recent Labs  Lab 11/23/19 0446 11/24/19 0440 11/25/19 0523 11/26/19 0609 11/27/19 0459  ALBUMIN 3.3* 3.2* 3.1* 2.9* 3.0*   No results for input(s): LIPASE, AMYLASE in the last 168 hours. No results for input(s): AMMONIA in the last 168 hours.  CBC: Recent Labs  Lab 11/23/19 0446 11/24/19 0440 11/25/19 0523 11/26/19 0609 11/28/19 0647  WBC 9.0 8.6 8.6 7.8 9.8  HGB 8.6* 8.9* 8.4* 8.7* 9.4*  HCT 29.1* 30.1* 28.2* 28.8* 30.9*  MCV 91.5 91.5 91.3 90.0 89.6  PLT 228 239 209 209 261    Cardiac Enzymes: No results for input(s): CKTOTAL, CKMB, CKMBINDEX, TROPONINI in the last 168 hours.  BNP: Invalid input(s): POCBNP  CBG: No results  for input(s): GLUCAP in the last 168 hours.  Microbiology: Results for orders placed or performed during the hospital encounter of 11/05/19  Culture, respiratory (non-expectorated)     Status: None   Collection Time: 11/06/19  1:46 PM   Specimen: Tracheal Aspirate; Respiratory  Result Value Ref Range Status   Specimen Description TRACHEAL ASPIRATE  Final   Special Requests NONE  Final   Gram Stain   Final    RARE WBC PRESENT, PREDOMINANTLY PMN NO ORGANISMS SEEN    Culture   Final    RARE Normal respiratory flora-no Staph aureus or Pseudomonas seen Performed at [facility] [address], [place], [place] [address]    Report Status 11/08/2019 FINAL  Final  Urine Culture     Status: Abnormal   Collection Time: 11/10/19  1:36 PM   Specimen: Urine, Random  Result Value Ref Range Status   Specimen Description URINE, RANDOM  Final   Special Requests   Final    NONE Performed at [facility], [place] [address], [place], [place] [address]    Culture 60,000 COLONIES/mL  YEAST (A)  Final   Report Status 11/11/2019 FINAL  Final  Culture, blood (routine x 2)     Status: None   Collection Time: 11/12/19 10:00 PM   Specimen: BLOOD  Result Value Ref Range Status   Specimen Description BLOOD SITE NOT SPECIFIED  Final   Special Requests   Final    BOTTLES DRAWN AEROBIC AND ANAEROBIC Blood Culture results may not be optimal due to an excessive volume of blood received in culture bottles   Culture   Final    NO GROWTH 5 DAYS Performed at [facility], [place] [address], [place], [place] [address]    Report Status 11/17/2019 FINAL  Final  Culture, blood (routine x 2)     Status: Abnormal   Collection Time: 11/12/19 10:06 PM   Specimen: BLOOD  Result Value Ref Range Status   Specimen Description BLOOD SITE NOT SPECIFIED  Final   Special Requests   Final    BOTTLES DRAWN AEROBIC AND ANAEROBIC Blood Culture results may not be optimal due to an excessive volume of blood received  in culture bottles   Culture  Setup Time   Final    GRAM POSITIVE COCCI ANAEROBIC BOTTLE ONLY CRITICAL RESULT CALLED TO, READ BACK BY AND VERIFIED WITH: RN J ALGHALI @0058  10/14/19 BY S GEZAHEGN  Performed at [facility], [place] [address], [place], [place] [address]    Culture STAPHYLOCOCCUS EPIDERMIDIS (A)  Final   Report Status 11/16/2019 FINAL  Final   Organism ID, Bacteria STAPHYLOCOCCUS EPIDERMIDIS  Final      Susceptibility   Staphylococcus epidermidis - MIC*    CIPROFLOXACIN >=8 RESISTANT Resistant     ERYTHROMYCIN >=8 RESISTANT Resistant     GENTAMICIN 8 INTERMEDIATE Intermediate     OXACILLIN >=4 RESISTANT Resistant     TETRACYCLINE 2 SENSITIVE Sensitive     VANCOMYCIN 1 SENSITIVE Sensitive     TRIMETH/SULFA 80 RESISTANT Resistant     CLINDAMYCIN >=8 RESISTANT Resistant     RIFAMPIN <=0.5 SENSITIVE Sensitive     Inducible Clindamycin NEGATIVE Sensitive     * STAPHYLOCOCCUS EPIDERMIDIS  Blood Culture ID Panel (Reflexed)     Status: Abnormal   Collection Time: 11/12/19 10:06 PM  Result Value Ref Range Status   Enterococcus faecalis NOT DETECTED NOT DETECTED Final   Enterococcus Faecium NOT DETECTED NOT DETECTED Final   Listeria monocytogenes NOT DETECTED NOT DETECTED Final   Staphylococcus species DETECTED (A) NOT DETECTED Final    Comment: CRITICAL RESULT CALLED TO, READ BACK BY AND VERIFIED WITH: RN J ALGHALI @0058  10/14/19 BY S GEZAHEGN     Staphylococcus aureus (BCID) NOT DETECTED NOT DETECTED Final   Staphylococcus epidermidis DETECTED (A) NOT DETECTED Final    Comment: Methicillin (oxacillin) resistant coagulase negative staphylococcus. Possible blood culture contaminant (unless isolated from more than one blood culture draw or clinical case suggests pathogenicity). No antibiotic treatment is indicated for blood  culture contaminants. CRITICAL RESULT CALLED TO, READ BACK BY AND VERIFIED WITH: RN J ALGHALI @0058  10/14/19 BY S GEZAHEGN     Staphylococcus  lugdunensis NOT DETECTED NOT DETECTED Final   Streptococcus species NOT DETECTED NOT DETECTED Final   Streptococcus agalactiae NOT DETECTED NOT DETECTED Final   Streptococcus pneumoniae NOT DETECTED NOT DETECTED Final   Streptococcus pyogenes NOT DETECTED NOT DETECTED Final   A.calcoaceticus-baumannii NOT DETECTED NOT DETECTED Final   Bacteroides fragilis NOT DETECTED NOT DETECTED Final   Enterobacterales NOT DETECTED NOT DETECTED Final  Enterobacter cloacae complex NOT DETECTED NOT DETECTED Final   Escherichia coli NOT DETECTED NOT DETECTED Final   Klebsiella aerogenes NOT DETECTED NOT DETECTED Final   Klebsiella oxytoca NOT DETECTED NOT DETECTED Final   Klebsiella pneumoniae NOT DETECTED NOT DETECTED Final   Proteus species NOT DETECTED NOT DETECTED Final   Salmonella species NOT DETECTED NOT DETECTED Final   Serratia marcescens NOT DETECTED NOT DETECTED Final   Haemophilus influenzae NOT DETECTED NOT DETECTED Final   Neisseria meningitidis NOT DETECTED NOT DETECTED Final   Pseudomonas aeruginosa NOT DETECTED NOT DETECTED Final   Stenotrophomonas maltophilia NOT DETECTED NOT DETECTED Final   Candida albicans NOT DETECTED NOT DETECTED Final   Candida auris NOT DETECTED NOT DETECTED Final   Candida glabrata NOT DETECTED NOT DETECTED Final   Candida krusei NOT DETECTED NOT DETECTED Final   Candida parapsilosis NOT DETECTED NOT DETECTED Final   Candida tropicalis NOT DETECTED NOT DETECTED Final   Cryptococcus neoformans/gattii NOT DETECTED NOT DETECTED Final   Methicillin resistance mecA/C DETECTED (A) NOT DETECTED Final    Comment: CRITICAL RESULT CALLED TO, READ BACK BY AND VERIFIED WITH: RN J ALGHALI @0058  10/14/19 BY S GEZAHEGN  Performed at [facility], [address], [place], [place] [address]   Culture, respiratory (non-expectorated)     Status: None   Collection Time: 11/12/19 10:23 PM   Specimen: Tracheal Aspirate; Respiratory  Result Value Ref Range Status    Specimen Description TRACHEAL ASPIRATE  Final   Special Requests NONE  Final   Gram Stain   Final    RARE WBC PRESENT,BOTH PMN AND MONONUCLEAR NO ORGANISMS SEEN Performed at [facility], [address] [place], [place] [address]    Culture FEW CANDIDA ALBICANS  Final   Report Status 11/15/2019 FINAL  Final  Culture, blood (routine x 2)     Status: None   Collection Time: 11/15/19  3:24 PM   Specimen: BLOOD RIGHT HAND  Result Value Ref Range Status   Specimen Description BLOOD RIGHT HAND  Final   Special Requests   Final    BOTTLES DRAWN AEROBIC AND ANAEROBIC Blood Culture adequate volume   Culture   Final    NO GROWTH 5 DAYS Performed at [facility], [place] [address], [place], [place] [address]    Report Status 11/20/2019 FINAL  Final  Culture, blood (routine x 2)     Status: None   Collection Time: 11/15/19  3:27 PM   Specimen: BLOOD LEFT HAND  Result Value Ref Range Status   Specimen Description BLOOD LEFT HAND  Final   Special Requests   Final    BOTTLES DRAWN AEROBIC AND ANAEROBIC Blood Culture adequate volume   Culture   Final    NO GROWTH 5 DAYS Performed at [facility], [place] [address] [place], [place] [address]    Report Status 11/20/2019 FINAL  Final  Culture, Urine     Status: None   Collection Time: 11/20/19  4:05 PM   Specimen: Urine, Random  Result Value Ref Range Status   Specimen Description URINE, RANDOM  Final   Special Requests NONE  Final   Culture   Final    NO GROWTH Performed at [facility], [place] [address], [place], [place] [address]    Report Status 11/21/2019 FINAL  Final  Culture, blood (routine x 2)     Status: None   Collection Time: 11/20/19  4:08 PM   Specimen:  BLOOD  Result Value Ref Range Status   Specimen Description BLOOD SITE NOT SPECIFIED  Final   Special Requests   Final    BOTTLES DRAWN AEROBIC AND ANAEROBIC Blood Culture adequate volume   Culture   Final    NO GROWTH 5 DAYS Performed at [facility], [address], [place], [place] [address]    Report Status 11/25/2019 FINAL  Final  Culture, blood (routine x 2)     Status: None   Collection Time: 11/20/19  4:15 PM   Specimen: BLOOD  Result Value Ref Range Status   Specimen Description BLOOD SITE NOT SPECIFIED  Final   Special Requests   Final    BOTTLES DRAWN AEROBIC AND ANAEROBIC Blood Culture results may not be optimal due to an excessive volume of blood received in culture bottles   Culture   Final    NO GROWTH 5 DAYS Performed at [facility], [place] [address] [place], [place] [address]    Report Status 11/25/2019 FINAL  Final  Culture, respiratory (non-expectorated)     Status: None   Collection Time: 11/20/19  6:43 PM   Specimen: Tracheal Aspirate; Respiratory  Result Value Ref Range Status   Specimen Description TRACHEAL ASPIRATE  Final   Special Requests NONE  Final   Gram Stain   Final    FEW WBC PRESENT,BOTH PMN AND MONONUCLEAR NO ORGANISMS SEEN Performed at [facility], [address] [place], [place] [address]    Culture RARE CANDIDA ALBICANS  Final   Report Status 11/24/2019 FINAL  Final    Coagulation Studies: No results for input(s): LABPROT, INR in the last 72 hours.  Urinalysis: No results for input(s): COLORURINE, LABSPEC, PHURINE, GLUCOSEU, HGBUR, BILIRUBINUR, KETONESUR, PROTEINUR, UROBILINOGEN, NITRITE, LEUKOCYTESUR in the last 72 hours.  Invalid input(s): APPERANCEUR    Imaging: DG CHEST PORT 1 VIEW  Result Date: 11/28/2019 CLINICAL DATA:  Respiratory failure EXAM: PORTABLE CHEST 1 VIEW COMPARISON:  11/26/2019 FINDINGS: Tracheostomy and right internal jugular central venous catheter with its tip within the deep right atrium are unchanged. Lung volumes are small, but are stable since prior examination. Superimposed asymmetric pulmonary infiltrate diffusely through the left lung and within the right mid lung zone appears stable since prior examination, likely infectious or  inflammatory. No pneumothorax or pleural effusion. Cardiac size within normal limits. Pulmonary vascularity is normal. IMPRESSION: Stable pulmonary insufflation. Stable superimposed asymmetric pulmonary infiltrate, likely infectious or inflammatory. Stable support lines and tubes. Electronically Signed   By: Fidela Salisbury MD   On: 11/28/2019 06:13     Medications:       Assessment/ Plan:  69 y.o. female with a PMHx of diabetes mellitus type 2, hyperlipidemia, plantar fasciitis, B12 deficiency, chronic low back pain, restless leg syndrome, GERD, nonalcoholic fatty liver disease, anxiety, nephrolithiasis, irritable bowel syndrome, hypertension, chronic kidney disease stage IIIa, who was admitted to [facility] on 11/05/2019 for ongoing treatment of post COVID-19 recovery.  1.  Acute kidney injury/chronic kidney disease stage IIIa.    Renal function slowly improving.  Creatinine down to 1.6.  BUN slightly high at 67.  Okay to maintain the patient on Lasix drip for now.  2.  Acute respiratory failure secondary to COVID-19 infection.    Pulmonary/critical care monitoring.  Has been difficult to wean from the ventilator.  3.  Anemia of chronic kidney disease.    Hemoglobin is 9.4.  Continue to monitor CBC periodically.  4.  Secondary hyperparathyroidism.  Phosphorus down to 4.7 with improved renal function.  5.  Bacteremia.  1 or 2 culture shows Staph epidermidis.  Dialysis catheter was removed.    LOS: 0 Veronica Melton 11/15/20218:14 AM

## 2019-11-29 DIAGNOSIS — N186 End stage renal disease: Secondary | ICD-10-CM | POA: Diagnosis not present

## 2019-11-29 DIAGNOSIS — Z9911 Dependence on respirator [ventilator] status: Secondary | ICD-10-CM | POA: Diagnosis not present

## 2019-11-29 DIAGNOSIS — U071 COVID-19: Secondary | ICD-10-CM | POA: Diagnosis not present

## 2019-11-29 DIAGNOSIS — J9621 Acute and chronic respiratory failure with hypoxia: Secondary | ICD-10-CM | POA: Diagnosis not present

## 2019-11-29 NOTE — Progress Notes (Signed)
Pulmonary Critical Care Medicine [place]   PULMONARY CRITICAL CARE SERVICE  PROGRESS NOTE  Date of Service: 11/29/2019  Veronica Melton  WCB:[id number]  DOB: [birth year]   DOA: 11/05/2019  Referring Physician: Merton Border, MD  HPI: Veronica Melton is a 69 y.o. female seen for follow up of Acute on Chronic Respiratory Failure.  Patient currently is on full support on the ventilator.  She is not been tolerating weaning.  Remains on 40% FiO2  Medications: Reviewed on Rounds  Physical Exam:  Vitals: Temperature 98.1 pulse 94 respiratory rate 29 blood pressure is 96/61 saturations 100%  Ventilator Settings on pressure assist control FiO2 is 40% tidal volume 700 IP 21 PEEP of 5  . General: Comfortable at this time . Eyes: Grossly normal lids, irises & conjunctiva . ENT: grossly tongue is normal . Neck: no obvious mass . Cardiovascular: S1 S2 normal no gallop . Respiratory: No rhonchi very coarse breath sounds . Abdomen: soft . Skin: no rash seen on limited exam . Musculoskeletal: not rigid . Psychiatric:unable to assess . Neurologic: no seizure no involuntary movements         Lab Data:   Basic Metabolic Panel: Recent Labs  Lab 11/24/19 0440 11/25/19 0523 11/26/19 0609 11/27/19 0459 11/28/19 0647  NA 142 138 138 136 132*  K 3.4* 3.8 3.0* 4.1 4.2  CL 94* 91* 90* 90* 84*  CO2 32 32 32 31 29  GLUCOSE 163* 170* 161* 153* 166*  BUN 63* 54* 56* 67* 82*  CREATININE 2.00* 1.93* 1.78* 1.66* 1.84*  CALCIUM 9.8 9.7 9.5 9.5 9.8  MG 1.9 1.9 1.7 2.4 2.2  PHOS 4.8* 5.0* 5.6* 4.7* 5.2*    ABG: No results for input(s): PHART, PCO2ART, PO2ART, HCO3, O2SAT in the last 168 hours.  Liver Function Tests: Recent Labs  Lab 11/24/19 0440 11/25/19 0523 11/26/19 0609 11/27/19 0459 11/28/19 0647  ALBUMIN 3.2* 3.1* 2.9* 3.0* 3.1*   No results for input(s): LIPASE, AMYLASE in the last 168 hours. No results for input(s): AMMONIA in the last 168  hours.  CBC: Recent Labs  Lab 11/23/19 0446 11/24/19 0440 11/25/19 0523 11/26/19 0609 11/28/19 0647  WBC 9.0 8.6 8.6 7.8 9.8  HGB 8.6* 8.9* 8.4* 8.7* 9.4*  HCT 29.1* 30.1* 28.2* 28.8* 30.9*  MCV 91.5 91.5 91.3 90.0 89.6  PLT 228 239 209 209 261    Cardiac Enzymes: No results for input(s): CKTOTAL, CKMB, CKMBINDEX, TROPONINI in the last 168 hours.  BNP (last 3 results) No results for input(s): BNP in the last 8760 hours.  ProBNP (last 3 results) No results for input(s): PROBNP in the last 8760 hours.  Radiological Exams: DG CHEST PORT 1 VIEW  Result Date: 11/28/2019 CLINICAL DATA:  Respiratory failure EXAM: PORTABLE CHEST 1 VIEW COMPARISON:  11/26/2019 FINDINGS: Tracheostomy and right internal jugular central venous catheter with its tip within the deep right atrium are unchanged. Lung volumes are small, but are stable since prior examination. Superimposed asymmetric pulmonary infiltrate diffusely through the left lung and within the right mid lung zone appears stable since prior examination, likely infectious or inflammatory. No pneumothorax or pleural effusion. Cardiac size within normal limits. Pulmonary vascularity is normal. IMPRESSION: Stable pulmonary insufflation. Stable superimposed asymmetric pulmonary infiltrate, likely infectious or inflammatory. Stable support lines and tubes. Electronically Signed   By: Fidela Salisbury MD   On: 11/28/2019 06:13    Assessment/Plan Active Problems:   Acute on chronic respiratory failure with hypoxia (HCC)   End stage renal  disease on dialysis ([place])   COVID-19 virus infection   Pneumonia due to COVID-19 virus   1. Acute on chronic respiratory failure with hypoxia we will continue with full support on the ventilator. 2. End-stage renal failure on dialysis being seen by nephrology good urine output 3. COVID-19 virus infection in recovery 4. Pneumonia due to COVID-19 treated   I have personally seen and evaluated the patient,  evaluated laboratory and imaging results, formulated the assessment and plan and placed orders. The Patient requires high complexity decision making with multiple systems involvement.  Rounds were done with the Respiratory Therapy Director and Staff therapists and discussed with nursing staff also.  Allyne Gee, MD [facility] Pulmonary Critical Care Medicine Sleep Medicine

## 2019-11-30 DIAGNOSIS — J9621 Acute and chronic respiratory failure with hypoxia: Secondary | ICD-10-CM | POA: Diagnosis not present

## 2019-11-30 DIAGNOSIS — Z9911 Dependence on respirator [ventilator] status: Secondary | ICD-10-CM | POA: Diagnosis not present

## 2019-11-30 DIAGNOSIS — U071 COVID-19: Secondary | ICD-10-CM | POA: Diagnosis not present

## 2019-11-30 DIAGNOSIS — N186 End stage renal disease: Secondary | ICD-10-CM | POA: Diagnosis not present

## 2019-11-30 LAB — RENAL FUNCTION PANEL
Albumin: 2.9 g/dL — ABNORMAL LOW (ref 3.5–5.0)
Anion gap: 16 — ABNORMAL HIGH (ref 5–15)
BUN: 119 mg/dL — ABNORMAL HIGH (ref 8–23)
CO2: 29 mmol/L (ref 22–32)
Calcium: 10.1 mg/dL (ref 8.9–10.3)
Chloride: 87 mmol/L — ABNORMAL LOW (ref 98–111)
Creatinine, Ser: 2.52 mg/dL — ABNORMAL HIGH (ref 0.44–1.00)
GFR, Estimated: 20 mL/min — ABNORMAL LOW (ref >60)
Glucose, Bld: 183 mg/dL — ABNORMAL HIGH (ref 70–99)
Phosphorus: 7.1 mg/dL — ABNORMAL HIGH (ref 2.5–4.6)
Potassium: 4.4 mmol/L (ref 3.5–5.1)
Sodium: 132 mmol/L — ABNORMAL LOW (ref 135–145)

## 2019-11-30 LAB — CBC
HCT: 28.3 % — ABNORMAL LOW (ref 36.0–46.0)
Hemoglobin: 8.7 g/dL — ABNORMAL LOW (ref 12.0–15.0)
MCH: 28 pg (ref 26.0–34.0)
MCHC: 30.7 g/dL (ref 30.0–36.0)
MCV: 91 fL (ref 80.0–100.0)
Platelets: 263 10*3/uL (ref 150–400)
RBC: 3.11 MIL/uL — ABNORMAL LOW (ref 3.87–5.11)
RDW: 21.2 % — ABNORMAL HIGH (ref 11.5–15.5)
WBC: 9.7 10*3/uL (ref 4.0–10.5)
nRBC: 0 % (ref 0.0–0.2)

## 2019-11-30 LAB — MAGNESIUM: Magnesium: 2.4 mg/dL (ref 1.7–2.4)

## 2019-11-30 LAB — TRIGLYCERIDES: Triglycerides: 649 mg/dL — ABNORMAL HIGH (ref <150)

## 2019-11-30 NOTE — Progress Notes (Signed)
Pulmonary Critical Care Medicine [place]   PULMONARY CRITICAL CARE SERVICE  PROGRESS NOTE  Date of Service: 11/30/2019  Veronica Melton  KGM:[id number]  DOB: 1950-05-09   DOA: 11/05/2019  Referring Physician: Merton Border, MD  HPI: Veronica Melton is a 69 y.o. female seen for follow up of Acute on Chronic Respiratory Failure.  She is on now full support on the ventilator remains on pressure control mode has on the sedation has been turned off  Medications: Reviewed on Rounds  Physical Exam:  Vitals: Temperature is 97.3 pulse 101 respiratory rate is 39 blood pressure is 137/55 saturations 96%  Ventilator Settings on pressure assist control FiO2 is 40% IP 21 tidal volume 512 PEEP  . General: Comfortable at this time . Eyes: Grossly normal lids, irises & conjunctiva . ENT: grossly tongue is normal . Neck: no obvious mass . Cardiovascular: S1 S2 normal no gallop . Respiratory: No rhonchi and no rales are noted at this time . Abdomen: soft . Skin: no rash seen on limited exam . Musculoskeletal: not rigid . Psychiatric:unable to assess . Neurologic: no seizure no involuntary movements         Lab Data:   Basic Metabolic Panel: Recent Labs  Lab 11/25/19 0523 11/26/19 0609 11/27/19 0459 11/28/19 0647 11/30/19 0457  NA 138 138 136 132* 132*  K 3.8 3.0* 4.1 4.2 4.4  CL 91* 90* 90* 84* 87*  CO2 32 32 31 29 29   GLUCOSE 170* 161* 153* 166* 183*  BUN 54* 56* 67* 82* 119*  CREATININE 1.93* 1.78* 1.66* 1.84* 2.52*  CALCIUM 9.7 9.5 9.5 9.8 10.1  MG 1.9 1.7 2.4 2.2 2.4  PHOS 5.0* 5.6* 4.7* 5.2* 7.1*    ABG: No results for input(s): PHART, PCO2ART, PO2ART, HCO3, O2SAT in the last 168 hours.  Liver Function Tests: Recent Labs  Lab 11/25/19 0523 11/26/19 0609 11/27/19 0459 11/28/19 0647 11/30/19 0457  ALBUMIN 3.1* 2.9* 3.0* 3.1* 2.9*   No results for input(s): LIPASE, AMYLASE in the last 168 hours. No results for input(s): AMMONIA in the last 168  hours.  CBC: Recent Labs  Lab 11/24/19 0440 11/25/19 0523 11/26/19 0609 11/28/19 0647 11/30/19 0457  WBC 8.6 8.6 7.8 9.8 9.7  HGB 8.9* 8.4* 8.7* 9.4* 8.7*  HCT 30.1* 28.2* 28.8* 30.9* 28.3*  MCV 91.5 91.3 90.0 89.6 91.0  PLT 239 209 209 261 263    Cardiac Enzymes: No results for input(s): CKTOTAL, CKMB, CKMBINDEX, TROPONINI in the last 168 hours.  BNP (last 3 results) No results for input(s): BNP in the last 8760 hours.  ProBNP (last 3 results) No results for input(s): PROBNP in the last 8760 hours.  Radiological Exams: No results found.  Assessment/Plan Active Problems:   Acute on chronic respiratory failure with hypoxia (HCC)   End stage renal disease on dialysis ([place])   COVID-19 virus infection   Pneumonia due to COVID-19 virus   1. Acute on chronic respiratory failure with hypoxia we'll continue with on full support on the ventilator.  Patient prognosis is guarded. 2. End-stage renal failure being followed by nephrology 3. COVID-19 virus infection in recovery 4. Pneumonia due to COVID-19 treated we'll continue to follow   I have personally seen and evaluated the patient, evaluated laboratory and imaging results, formulated the assessment and plan and placed orders. The Patient requires high complexity decision making with multiple systems involvement.  Rounds were done with the Respiratory Therapy Director and Staff therapists and discussed with nursing staff also.  Allyne Gee, MD [facility] Pulmonary Critical Care Medicine Sleep Medicine

## 2019-11-30 NOTE — Progress Notes (Signed)
[facility]  ROUNDING NOTE   Subjective:  Patient seen and evaluated at bedside. Still on the ventilator. Requiring 40% FiO2.   Objective:  Vital signs in last 24 hours:  Temperature 97.3 pulse 101 respirations 39 blood pressure 137/55  Physical Exam: General:  Critically ill-appearing  Head:  Normocephalic, atraumatic. Moist oral mucosal membranes  Eyes:  Anicteric  Neck:  Tracheostomy in place  Lungs:   Scattered rhonchi, vent assisted  Heart:  S1S2 no rubs  Abdomen:   Soft, nontender, bowel sounds present  Extremities:  Trace peripheral edema.  Neurologic:  Sedated  Skin:  No acute rashes  Access:  No dialysis access    Basic Metabolic Panel: Recent Labs  Lab 11/25/19 0523 11/25/19 0523 11/26/19 0609 11/26/19 0609 11/27/19 0459 11/28/19 0647 11/30/19 0457  NA 138  --  138  --  136 132* 132*  K 3.8  --  3.0*  --  4.1 4.2 4.4  CL 91*  --  90*  --  90* 84* 87*  CO2 32  --  32  --  31 29 29   GLUCOSE 170*  --  161*  --  153* 166* 183*  BUN 54*  --  56*  --  67* 82* 119*  CREATININE 1.93*  --  1.78*  --  1.66* 1.84* 2.52*  CALCIUM 9.7   < > 9.5   < > 9.5 9.8 10.1  MG 1.9  --  1.7  --  2.4 2.2 2.4  PHOS 5.0*  --  5.6*  --  4.7* 5.2* 7.1*   < > = values in this interval not displayed.    Liver Function Tests: Recent Labs  Lab 11/25/19 0523 11/26/19 0609 11/27/19 0459 11/28/19 0647 11/30/19 0457  ALBUMIN 3.1* 2.9* 3.0* 3.1* 2.9*   No results for input(s): LIPASE, AMYLASE in the last 168 hours. No results for input(s): AMMONIA in the last 168 hours.  CBC: Recent Labs  Lab 11/24/19 0440 11/25/19 0523 11/26/19 0609 11/28/19 0647 11/30/19 0457  WBC 8.6 8.6 7.8 9.8 9.7  HGB 8.9* 8.4* 8.7* 9.4* 8.7*  HCT 30.1* 28.2* 28.8* 30.9* 28.3*  MCV 91.5 91.3 90.0 89.6 91.0  PLT 239 209 209 261 263    Cardiac Enzymes: No results for input(s): CKTOTAL, CKMB, CKMBINDEX, TROPONINI in the last 168 hours.  BNP: Invalid input(s): POCBNP  CBG: No  results for input(s): GLUCAP in the last 168 hours.  Microbiology: Results for orders placed or performed during the hospital encounter of 11/05/19  Culture, respiratory (non-expectorated)     Status: None   Collection Time: 11/06/19  1:46 PM   Specimen: Tracheal Aspirate; Respiratory  Result Value Ref Range Status   Specimen Description TRACHEAL ASPIRATE  Final   Special Requests NONE  Final   Gram Stain   Final    RARE WBC PRESENT, PREDOMINANTLY PMN NO ORGANISMS SEEN    Culture   Final    RARE Normal respiratory flora-no Staph aureus or Pseudomonas seen Performed at [facility] [address], [place], [place] [address]    Report Status 11/08/2019 FINAL  Final  Urine Culture     Status: Abnormal   Collection Time: 11/10/19  1:36 PM   Specimen: Urine, Random  Result Value Ref Range Status   Specimen Description URINE, RANDOM  Final   Special Requests   Final    NONE Performed at [facility], [place] [address], [place], [place] [address]    Culture  60,000 COLONIES/mL YEAST (A)  Final   Report Status 11/11/2019 FINAL  Final  Culture, blood (routine x 2)     Status: None   Collection Time: 11/12/19 10:00 PM   Specimen: BLOOD  Result Value Ref Range Status   Specimen Description BLOOD SITE NOT SPECIFIED  Final   Special Requests   Final    BOTTLES DRAWN AEROBIC AND ANAEROBIC Blood Culture results may not be optimal due to an excessive volume of blood received in culture bottles   Culture   Final    NO GROWTH 5 DAYS Performed at [facility], [place] [address], [place], [place] [address]    Report Status 11/17/2019 FINAL  Final  Culture, blood (routine x 2)     Status: Abnormal   Collection Time: 11/12/19 10:06 PM   Specimen: BLOOD  Result Value Ref Range Status   Specimen Description BLOOD SITE NOT SPECIFIED  Final   Special Requests   Final    BOTTLES DRAWN AEROBIC AND ANAEROBIC Blood Culture results may not be optimal due to an excessive volume of blood  received in culture bottles   Culture  Setup Time   Final    GRAM POSITIVE COCCI ANAEROBIC BOTTLE ONLY CRITICAL RESULT CALLED TO, READ BACK BY AND VERIFIED WITH: RN J ALGHALI @0058  10/14/19 BY S GEZAHEGN  Performed at [facility], [place] [address], [place], [place] [address]    Culture STAPHYLOCOCCUS EPIDERMIDIS (A)  Final   Report Status 11/16/2019 FINAL  Final   Organism ID, Bacteria STAPHYLOCOCCUS EPIDERMIDIS  Final      Susceptibility   Staphylococcus epidermidis - MIC*    CIPROFLOXACIN >=8 RESISTANT Resistant     ERYTHROMYCIN >=8 RESISTANT Resistant     GENTAMICIN 8 INTERMEDIATE Intermediate     OXACILLIN >=4 RESISTANT Resistant     TETRACYCLINE 2 SENSITIVE Sensitive     VANCOMYCIN 1 SENSITIVE Sensitive     TRIMETH/SULFA 80 RESISTANT Resistant     CLINDAMYCIN >=8 RESISTANT Resistant     RIFAMPIN <=0.5 SENSITIVE Sensitive     Inducible Clindamycin NEGATIVE Sensitive     * STAPHYLOCOCCUS EPIDERMIDIS  Blood Culture ID Panel (Reflexed)     Status: Abnormal   Collection Time: 11/12/19 10:06 PM  Result Value Ref Range Status   Enterococcus faecalis NOT DETECTED NOT DETECTED Final   Enterococcus Faecium NOT DETECTED NOT DETECTED Final   Listeria monocytogenes NOT DETECTED NOT DETECTED Final   Staphylococcus species DETECTED (A) NOT DETECTED Final    Comment: CRITICAL RESULT CALLED TO, READ BACK BY AND VERIFIED WITH: RN J ALGHALI @0058  10/14/19 BY S GEZAHEGN     Staphylococcus aureus (BCID) NOT DETECTED NOT DETECTED Final   Staphylococcus epidermidis DETECTED (A) NOT DETECTED Final    Comment: Methicillin (oxacillin) resistant coagulase negative staphylococcus. Possible blood culture contaminant (unless isolated from more than one blood culture draw or clinical case suggests pathogenicity). No antibiotic treatment is indicated for blood  culture contaminants. CRITICAL RESULT CALLED TO, READ BACK BY AND VERIFIED WITH: RN J ALGHALI @0058  10/14/19 BY S GEZAHEGN     Staphylococcus  lugdunensis NOT DETECTED NOT DETECTED Final   Streptococcus species NOT DETECTED NOT DETECTED Final   Streptococcus agalactiae NOT DETECTED NOT DETECTED Final   Streptococcus pneumoniae NOT DETECTED NOT DETECTED Final   Streptococcus pyogenes NOT DETECTED NOT DETECTED Final   A.calcoaceticus-baumannii NOT DETECTED NOT DETECTED Final   Bacteroides fragilis NOT DETECTED NOT DETECTED Final   Enterobacterales NOT DETECTED NOT DETECTED  Final   Enterobacter cloacae complex NOT DETECTED NOT DETECTED Final   Escherichia coli NOT DETECTED NOT DETECTED Final   Klebsiella aerogenes NOT DETECTED NOT DETECTED Final   Klebsiella oxytoca NOT DETECTED NOT DETECTED Final   Klebsiella pneumoniae NOT DETECTED NOT DETECTED Final   Proteus species NOT DETECTED NOT DETECTED Final   Salmonella species NOT DETECTED NOT DETECTED Final   Serratia marcescens NOT DETECTED NOT DETECTED Final   Haemophilus influenzae NOT DETECTED NOT DETECTED Final   Neisseria meningitidis NOT DETECTED NOT DETECTED Final   Pseudomonas aeruginosa NOT DETECTED NOT DETECTED Final   Stenotrophomonas maltophilia NOT DETECTED NOT DETECTED Final   Candida albicans NOT DETECTED NOT DETECTED Final   Candida auris NOT DETECTED NOT DETECTED Final   Candida glabrata NOT DETECTED NOT DETECTED Final   Candida krusei NOT DETECTED NOT DETECTED Final   Candida parapsilosis NOT DETECTED NOT DETECTED Final   Candida tropicalis NOT DETECTED NOT DETECTED Final   Cryptococcus neoformans/gattii NOT DETECTED NOT DETECTED Final   Methicillin resistance mecA/C DETECTED (A) NOT DETECTED Final    Comment: CRITICAL RESULT CALLED TO, READ BACK BY AND VERIFIED WITH: RN J ALGHALI @0058  10/14/19 BY S GEZAHEGN  Performed at [facility], [address], [place], [place] [address]   Culture, respiratory (non-expectorated)     Status: None   Collection Time: 11/12/19 10:23 PM   Specimen: Tracheal Aspirate; Respiratory  Result Value Ref Range Status    Specimen Description TRACHEAL ASPIRATE  Final   Special Requests NONE  Final   Gram Stain   Final    RARE WBC PRESENT,BOTH PMN AND MONONUCLEAR NO ORGANISMS SEEN Performed at [facility], [address], [place], [place] [address]    Culture FEW CANDIDA ALBICANS  Final   Report Status 11/15/2019 FINAL  Final  Culture, blood (routine x 2)     Status: None   Collection Time: 11/15/19  3:24 PM   Specimen: BLOOD RIGHT HAND  Result Value Ref Range Status   Specimen Description BLOOD RIGHT HAND  Final   Special Requests   Final    BOTTLES DRAWN AEROBIC AND ANAEROBIC Blood Culture adequate volume   Culture   Final    NO GROWTH 5 DAYS Performed at [facility], [place] [address], [place], [place] [address]    Report Status 11/20/2019 FINAL  Final  Culture, blood (routine x 2)     Status: None   Collection Time: 11/15/19  3:27 PM   Specimen: BLOOD LEFT HAND  Result Value Ref Range Status   Specimen Description BLOOD LEFT HAND  Final   Special Requests   Final    BOTTLES DRAWN AEROBIC AND ANAEROBIC Blood Culture adequate volume   Culture   Final    NO GROWTH 5 DAYS Performed at [facility], [place] [address], [place], [place] [address]    Report Status 11/20/2019 FINAL  Final  Culture, Urine     Status: None   Collection Time: 11/20/19  4:05 PM   Specimen: Urine, Random  Result Value Ref Range Status   Specimen Description URINE, RANDOM  Final   Special Requests NONE  Final   Culture   Final    NO GROWTH Performed at [facility], [place] [address] [place],  [address]    Report Status 11/21/2019 FINAL  Final  Culture, blood (routine x 2)     Status: None   Collection Time: 11/20/19  4:08 PM  Specimen: BLOOD  Result Value Ref Range Status   Specimen Description BLOOD SITE NOT SPECIFIED  Final   Special Requests   Final    BOTTLES DRAWN AEROBIC AND ANAEROBIC Blood Culture adequate volume   Culture   Final    NO GROWTH 5 DAYS Performed at [facility], [address], [place], [place] [address]    Report Status 11/25/2019 FINAL  Final  Culture, blood (routine x 2)     Status: None   Collection Time: 11/20/19  4:15 PM   Specimen: BLOOD  Result Value Ref Range Status   Specimen Description BLOOD SITE NOT SPECIFIED  Final   Special Requests   Final    BOTTLES DRAWN AEROBIC AND ANAEROBIC Blood Culture results may not be optimal due to an excessive volume of blood received in culture bottles   Culture   Final    NO GROWTH 5 DAYS Performed at [facility], [place] [address], [place], [place] [address]    Report Status 11/25/2019 FINAL  Final  Culture, respiratory (non-expectorated)     Status: None   Collection Time: 11/20/19  6:43 PM   Specimen: Tracheal Aspirate; Respiratory  Result Value Ref Range Status   Specimen Description TRACHEAL ASPIRATE  Final   Special Requests NONE  Final   Gram Stain   Final    FEW WBC PRESENT,BOTH PMN AND MONONUCLEAR NO ORGANISMS SEEN Performed at [facility], [address], [place], [place] [address]    Culture RARE CANDIDA ALBICANS  Final   Report Status 11/24/2019 FINAL  Final    Coagulation Studies: No results for input(s): LABPROT, INR in the last 72 hours.  Urinalysis: No results for input(s): COLORURINE, LABSPEC, PHURINE, GLUCOSEU, HGBUR, BILIRUBINUR, KETONESUR, PROTEINUR, UROBILINOGEN, NITRITE, LEUKOCYTESUR in the last 72 hours.  Invalid input(s): APPERANCEUR    Imaging: No results found.   Medications:       Assessment/ Plan:  69 y.o. female with a PMHx of diabetes mellitus type 2, hyperlipidemia, plantar fasciitis, B12 deficiency, chronic low back pain, restless leg syndrome, GERD, nonalcoholic fatty liver disease, anxiety, nephrolithiasis, irritable bowel syndrome, hypertension, chronic kidney disease stage IIIa, who was admitted to [facility] on 11/05/2019 for ongoing treatment of post COVID-19 recovery.  1.  Acute kidney injury/chronic kidney disease stage  IIIa.    BUN and creatinine both up now to 119 and 2.5 respectively.  Now off of Lasix drip.  Continue to monitor renal parameters.  2.  Acute respiratory failure secondary to COVID-19 infection.    Still on the ventilator.  Requiring 40% FiO2 with PEEP of 5.  Weaning as per pulmonary/critical care.  3.  Anemia of chronic kidney disease.    Hemoglobin 8.7.  Continue to monitor.  4.  Secondary hyperparathyroidism.  Phosphorus up to 7.1.  Likely due to worsening renal function.  Continue to monitor trend.  5.  Bacteremia.  1 or 2 culture shows Staph epidermidis.  Dialysis catheter was removed.    LOS: 0 Veronica Melton 11/17/20217:54 AM

## 2019-12-01 DIAGNOSIS — J9621 Acute and chronic respiratory failure with hypoxia: Secondary | ICD-10-CM | POA: Diagnosis not present

## 2019-12-01 DIAGNOSIS — U071 COVID-19: Secondary | ICD-10-CM | POA: Diagnosis not present

## 2019-12-01 DIAGNOSIS — Z9911 Dependence on respirator [ventilator] status: Secondary | ICD-10-CM | POA: Diagnosis not present

## 2019-12-01 DIAGNOSIS — N186 End stage renal disease: Secondary | ICD-10-CM | POA: Diagnosis not present

## 2019-12-01 NOTE — Progress Notes (Signed)
Pulmonary Critical Care Medicine [place]   PULMONARY CRITICAL CARE SERVICE  PROGRESS NOTE  Date of Service: 12/01/2019  Veronica Melton  MVH:[id number]  DOB: [birth year]   DOA: 11/05/2019  Referring Physician: Merton Border, MD  HPI: Veronica Melton is a 69 y.o. female seen for follow up of Acute on Chronic Respiratory Failure.  Patient currently is on pressure support started on a wean this morning and is tolerating it quite well  Medications: Reviewed on Rounds  Physical Exam:  Vitals: Temperature 97.3 pulse 103 respiratory rate 30 blood pressure is 123/63 saturations 99%  Ventilator Settings on pressure support FiO2 is 40% pressure 12/5 tidal line 520  . General: Comfortable at this time . Eyes: Grossly normal lids, irises & conjunctiva . ENT: grossly tongue is normal . Neck: no obvious mass . Cardiovascular: S1 S2 normal no gallop . Respiratory: Scattered rhonchi expansion is equal . Abdomen: soft . Skin: no rash seen on limited exam . Musculoskeletal: not rigid . Psychiatric:unable to assess . Neurologic: no seizure no involuntary movements         Lab Data:   Basic Metabolic Panel: Recent Labs  Lab 11/25/19 0523 11/26/19 0609 11/27/19 0459 11/28/19 0647 11/30/19 0457  NA 138 138 136 132* 132*  K 3.8 3.0* 4.1 4.2 4.4  CL 91* 90* 90* 84* 87*  CO2 32 32 31 29 29   GLUCOSE 170* 161* 153* 166* 183*  BUN 54* 56* 67* 82* 119*  CREATININE 1.93* 1.78* 1.66* 1.84* 2.52*  CALCIUM 9.7 9.5 9.5 9.8 10.1  MG 1.9 1.7 2.4 2.2 2.4  PHOS 5.0* 5.6* 4.7* 5.2* 7.1*    ABG: No results for input(s): PHART, PCO2ART, PO2ART, HCO3, O2SAT in the last 168 hours.  Liver Function Tests: Recent Labs  Lab 11/25/19 0523 11/26/19 0609 11/27/19 0459 11/28/19 0647 11/30/19 0457  ALBUMIN 3.1* 2.9* 3.0* 3.1* 2.9*   No results for input(s): LIPASE, AMYLASE in the last 168 hours. No results for input(s): AMMONIA in the last 168 hours.  CBC: Recent Labs  Lab  11/25/19 0523 11/26/19 0609 11/28/19 0647 11/30/19 0457  WBC 8.6 7.8 9.8 9.7  HGB 8.4* 8.7* 9.4* 8.7*  HCT 28.2* 28.8* 30.9* 28.3*  MCV 91.3 90.0 89.6 91.0  PLT 209 209 261 263    Cardiac Enzymes: No results for input(s): CKTOTAL, CKMB, CKMBINDEX, TROPONINI in the last 168 hours.  BNP (last 3 results) No results for input(s): BNP in the last 8760 hours.  ProBNP (last 3 results) No results for input(s): PROBNP in the last 8760 hours.  Radiological Exams: No results found.  Assessment/Plan Active Problems:   Acute on chronic respiratory failure with hypoxia (HCC)   End stage renal disease on dialysis ([place])   COVID-19 virus infection   Pneumonia due to COVID-19 virus   1. Acute on chronic respiratory failure hypoxia continue with pressure support 12/5 patient seems to be tolerating the weaning fairly well. 2. End-stage renal failure on hemodialysis 3. COVID-19 virus infection in recovery 4. Pneumonia due to COVID-19 treated we will continue to monitor   I have personally seen and evaluated the patient, evaluated laboratory and imaging results, formulated the assessment and plan and placed orders. The Patient requires high complexity decision making with multiple systems involvement.  Rounds were done with the Respiratory Therapy Director and Staff therapists and discussed with nursing staff also.  Allyne Gee, MD [facility] Pulmonary Critical Care Medicine Sleep Medicine

## 2019-12-02 DIAGNOSIS — U071 COVID-19: Secondary | ICD-10-CM | POA: Diagnosis not present

## 2019-12-02 DIAGNOSIS — N186 End stage renal disease: Secondary | ICD-10-CM | POA: Diagnosis not present

## 2019-12-02 DIAGNOSIS — J9621 Acute and chronic respiratory failure with hypoxia: Secondary | ICD-10-CM | POA: Diagnosis not present

## 2019-12-02 DIAGNOSIS — Z9911 Dependence on respirator [ventilator] status: Secondary | ICD-10-CM | POA: Diagnosis not present

## 2019-12-02 LAB — CBC
HCT: 31.6 % — ABNORMAL LOW (ref 36.0–46.0)
Hemoglobin: 9.5 g/dL — ABNORMAL LOW (ref 12.0–15.0)
MCH: 27.7 pg (ref 26.0–34.0)
MCHC: 30.1 g/dL (ref 30.0–36.0)
MCV: 92.1 fL (ref 80.0–100.0)
Platelets: 321 10*3/uL (ref 150–400)
RBC: 3.43 MIL/uL — ABNORMAL LOW (ref 3.87–5.11)
RDW: 21.5 % — ABNORMAL HIGH (ref 11.5–15.5)
WBC: 10.3 10*3/uL (ref 4.0–10.5)
nRBC: 0.2 % (ref 0.0–0.2)

## 2019-12-02 LAB — RENAL FUNCTION PANEL
Albumin: 3.2 g/dL — ABNORMAL LOW (ref 3.5–5.0)
Anion gap: 18 — ABNORMAL HIGH (ref 5–15)
BUN: 150 mg/dL — ABNORMAL HIGH (ref 8–23)
CO2: 34 mmol/L — ABNORMAL HIGH (ref 22–32)
Calcium: 10.9 mg/dL — ABNORMAL HIGH (ref 8.9–10.3)
Chloride: 87 mmol/L — ABNORMAL LOW (ref 98–111)
Creatinine, Ser: 2.91 mg/dL — ABNORMAL HIGH (ref 0.44–1.00)
GFR, Estimated: 17 mL/min — ABNORMAL LOW (ref >60)
Glucose, Bld: 200 mg/dL — ABNORMAL HIGH (ref 70–99)
Phosphorus: 6.8 mg/dL — ABNORMAL HIGH (ref 2.5–4.6)
Potassium: 4.4 mmol/L (ref 3.5–5.1)
Sodium: 139 mmol/L (ref 135–145)

## 2019-12-02 LAB — MAGNESIUM: Magnesium: 2.7 mg/dL — ABNORMAL HIGH (ref 1.7–2.4)

## 2019-12-02 NOTE — Progress Notes (Signed)
Pulmonary Critical Care Medicine [place]   PULMONARY CRITICAL CARE SERVICE  PROGRESS NOTE  Date of Service: 12/02/2019  Veronica Melton  ZTI:[id number]  DOB: 1950/06/21   DOA: 11/05/2019  Referring Physician: Merton Border, MD  HPI: Adisen Bennion is a 69 y.o. female seen for follow up of Acute on Chronic Respiratory Failure.  Patient currently is on pressure control mode has been on 40% FiO2 with good saturations.  Today will be attempting pressure support at 8 hours  Medications: Reviewed on Rounds  Physical Exam:  Vitals: Temperature is 96.6 pulse 106 respiratory rate 20 blood pressure is 106/62 saturations 98%  Ventilator Settings on pressure control FiO2 is 40% IP 21 PEEP 5  . General: Comfortable at this time . Eyes: Grossly normal lids, irises & conjunctiva . ENT: grossly tongue is normal . Neck: no obvious mass . Cardiovascular: S1 S2 normal no gallop . Respiratory: No rhonchi no rales . Abdomen: soft . Skin: no rash seen on limited exam . Musculoskeletal: not rigid . Psychiatric:unable to assess . Neurologic: no seizure no involuntary movements         Lab Data:   Basic Metabolic Panel: Recent Labs  Lab 11/26/19 0609 11/27/19 0459 11/28/19 0647 11/30/19 0457 12/02/19 0353  NA 138 136 132* 132* 139  K 3.0* 4.1 4.2 4.4 4.4  CL 90* 90* 84* 87* 87*  CO2 32 31 29 29  34*  GLUCOSE 161* 153* 166* 183* 200*  BUN 56* 67* 82* 119* 150*  CREATININE 1.78* 1.66* 1.84* 2.52* 2.91*  CALCIUM 9.5 9.5 9.8 10.1 10.9*  MG 1.7 2.4 2.2 2.4 2.7*  PHOS 5.6* 4.7* 5.2* 7.1* 6.8*    ABG: No results for input(s): PHART, PCO2ART, PO2ART, HCO3, O2SAT in the last 168 hours.  Liver Function Tests: Recent Labs  Lab 11/26/19 0609 11/27/19 0459 11/28/19 0647 11/30/19 0457 12/02/19 0353  ALBUMIN 2.9* 3.0* 3.1* 2.9* 3.2*   No results for input(s): LIPASE, AMYLASE in the last 168 hours. No results for input(s): AMMONIA in the last 168  hours.  CBC: Recent Labs  Lab 11/26/19 0609 11/28/19 0647 11/30/19 0457 12/02/19 0353  WBC 7.8 9.8 9.7 10.3  HGB 8.7* 9.4* 8.7* 9.5*  HCT 28.8* 30.9* 28.3* 31.6*  MCV 90.0 89.6 91.0 92.1  PLT 209 261 263 321    Cardiac Enzymes: No results for input(s): CKTOTAL, CKMB, CKMBINDEX, TROPONINI in the last 168 hours.  BNP (last 3 results) No results for input(s): BNP in the last 8760 hours.  ProBNP (last 3 results) No results for input(s): PROBNP in the last 8760 hours.  Radiological Exams: No results found.  Assessment/Plan Active Problems:   Acute on chronic respiratory failure with hypoxia (HCC)   End stage renal disease on dialysis ([place])   COVID-19 virus infection   Pneumonia due to COVID-19 virus   1. Acute on chronic respiratory failure hypoxia we will continue with pressure control resting mode wean on pressure support as tolerated 2. End-stage renal failure on hemodialysis 3. COVID-19 virus infection treated 4. Pneumonia due to COVID-19 slow improvement   I have personally seen and evaluated the patient, evaluated laboratory and imaging results, formulated the assessment and plan and placed orders. The Patient requires high complexity decision making with multiple systems involvement.  Rounds were done with the Respiratory Therapy Director and Staff therapists and discussed with nursing staff also.  Allyne Gee, MD [facility] Pulmonary Critical Care Medicine Sleep Medicine

## 2019-12-02 NOTE — Progress Notes (Signed)
[facility]  ROUNDING NOTE   Subjective:  BUN and creatinine rising. BUN currently 150 with a creatinine of 2.91.   Objective:  Vital signs in last 24 hours:  Temperature 96.9 pulse 106 respirations 20 blood pressure 120/62  Physical Exam: General:  Critically ill-appearing  Head:  Normocephalic, atraumatic. Moist oral mucosal membranes  Eyes:  Anicteric  Neck:  Tracheostomy in place  Lungs:   Scattered rhonchi, vent assisted  Heart:  S1S2 no rubs  Abdomen:   Soft, nontender, bowel sounds present  Extremities:  Trace peripheral edema.  Neurologic:  Sedated  Skin:  No acute rashes  Access:  No dialysis access    Basic Metabolic Panel: Recent Labs  Lab 11/26/19 0609 11/26/19 0609 11/27/19 0459 11/27/19 0459 11/28/19 0647 11/30/19 0457 12/02/19 0353  NA 138  --  136  --  132* 132* 139  K 3.0*  --  4.1  --  4.2 4.4 4.4  CL 90*  --  90*  --  84* 87* 87*  CO2 32  --  31  --  29 29 34*  GLUCOSE 161*  --  153*  --  166* 183* 200*  BUN 56*  --  67*  --  82* 119* 150*  CREATININE 1.78*  --  1.66*  --  1.84* 2.52* 2.91*  CALCIUM 9.5   < > 9.5   < > 9.8 10.1 10.9*  MG 1.7  --  2.4  --  2.2 2.4 2.7*  PHOS 5.6*  --  4.7*  --  5.2* 7.1* 6.8*   < > = values in this interval not displayed.    Liver Function Tests: Recent Labs  Lab 11/26/19 0609 11/27/19 0459 11/28/19 0647 11/30/19 0457 12/02/19 0353  ALBUMIN 2.9* 3.0* 3.1* 2.9* 3.2*   No results for input(s): LIPASE, AMYLASE in the last 168 hours. No results for input(s): AMMONIA in the last 168 hours.  CBC: Recent Labs  Lab 11/26/19 0609 11/28/19 0647 11/30/19 0457 12/02/19 0353  WBC 7.8 9.8 9.7 10.3  HGB 8.7* 9.4* 8.7* 9.5*  HCT 28.8* 30.9* 28.3* 31.6*  MCV 90.0 89.6 91.0 92.1  PLT 209 261 263 321    Cardiac Enzymes: No results for input(s): CKTOTAL, CKMB, CKMBINDEX, TROPONINI in the last 168 hours.  BNP: Invalid input(s): POCBNP  CBG: No results for input(s): GLUCAP in the last 168  hours.  Microbiology: Results for orders placed or performed during the hospital encounter of 11/05/19  Culture, respiratory (non-expectorated)     Status: None   Collection Time: 11/06/19  1:46 PM   Specimen: Tracheal Aspirate; Respiratory  Result Value Ref Range Status   Specimen Description TRACHEAL ASPIRATE  Final   Special Requests NONE  Final   Gram Stain   Final    RARE WBC PRESENT, PREDOMINANTLY PMN NO ORGANISMS SEEN    Culture   Final    RARE Normal respiratory flora-no Staph aureus or Pseudomonas seen Performed at [facility] [address], [place], [place] [address]    Report Status 11/08/2019 FINAL  Final  Urine Culture     Status: Abnormal   Collection Time: 11/10/19  1:36 PM   Specimen: Urine, Random  Result Value Ref Range Status   Specimen Description URINE, RANDOM  Final   Special Requests   Final    NONE Performed at [facility], [place] [address], [place], [place] [address]    Culture 60,000 COLONIES/mL YEAST (A)  Final  Report Status 11/11/2019 FINAL  Final  Culture, blood (routine x 2)     Status: None   Collection Time: 11/12/19 10:00 PM   Specimen: BLOOD  Result Value Ref Range Status   Specimen Description BLOOD SITE NOT SPECIFIED  Final   Special Requests   Final    BOTTLES DRAWN AEROBIC AND ANAEROBIC Blood Culture results may not be optimal due to an excessive volume of blood received in culture bottles   Culture   Final    NO GROWTH 5 DAYS Performed at [facility], [place] [address] [place], [place] [address]    Report Status 11/17/2019 FINAL  Final  Culture, blood (routine x 2)     Status: Abnormal   Collection Time: 11/12/19 10:06 PM   Specimen: BLOOD  Result Value Ref Range Status   Specimen Description BLOOD SITE NOT SPECIFIED  Final   Special Requests   Final    BOTTLES DRAWN AEROBIC AND ANAEROBIC Blood Culture results may not be optimal due to an excessive volume of blood received in culture bottles   Culture  Setup  Time   Final    GRAM POSITIVE COCCI ANAEROBIC BOTTLE ONLY CRITICAL RESULT CALLED TO, READ BACK BY AND VERIFIED WITH: RN J ALGHALI @0058  10/14/19 BY S GEZAHEGN  Performed at [facility], [address] [place], [place] [address]    Culture STAPHYLOCOCCUS EPIDERMIDIS (A)  Final   Report Status 11/16/2019 FINAL  Final   Organism ID, Bacteria STAPHYLOCOCCUS EPIDERMIDIS  Final      Susceptibility   Staphylococcus epidermidis - MIC*    CIPROFLOXACIN >=8 RESISTANT Resistant     ERYTHROMYCIN >=8 RESISTANT Resistant     GENTAMICIN 8 INTERMEDIATE Intermediate     OXACILLIN >=4 RESISTANT Resistant     TETRACYCLINE 2 SENSITIVE Sensitive     VANCOMYCIN 1 SENSITIVE Sensitive     TRIMETH/SULFA 80 RESISTANT Resistant     CLINDAMYCIN >=8 RESISTANT Resistant     RIFAMPIN <=0.5 SENSITIVE Sensitive     Inducible Clindamycin NEGATIVE Sensitive     * STAPHYLOCOCCUS EPIDERMIDIS  Blood Culture ID Panel (Reflexed)     Status: Abnormal   Collection Time: 11/12/19 10:06 PM  Result Value Ref Range Status   Enterococcus faecalis NOT DETECTED NOT DETECTED Final   Enterococcus Faecium NOT DETECTED NOT DETECTED Final   Listeria monocytogenes NOT DETECTED NOT DETECTED Final   Staphylococcus species DETECTED (A) NOT DETECTED Final    Comment: CRITICAL RESULT CALLED TO, READ BACK BY AND VERIFIED WITH: RN J ALGHALI @0058  10/14/19 BY S GEZAHEGN     Staphylococcus aureus (BCID) NOT DETECTED NOT DETECTED Final   Staphylococcus epidermidis DETECTED (A) NOT DETECTED Final    Comment: Methicillin (oxacillin) resistant coagulase negative staphylococcus. Possible blood culture contaminant (unless isolated from more than one blood culture draw or clinical case suggests pathogenicity). No antibiotic treatment is indicated for blood  culture contaminants. CRITICAL RESULT CALLED TO, READ BACK BY AND VERIFIED WITH: RN J ALGHALI @0058  10/14/19 BY S GEZAHEGN     Staphylococcus lugdunensis NOT DETECTED NOT DETECTED Final    Streptococcus species NOT DETECTED NOT DETECTED Final   Streptococcus agalactiae NOT DETECTED NOT DETECTED Final   Streptococcus pneumoniae NOT DETECTED NOT DETECTED Final   Streptococcus pyogenes NOT DETECTED NOT DETECTED Final   A.calcoaceticus-baumannii NOT DETECTED NOT DETECTED Final   Bacteroides fragilis NOT DETECTED NOT DETECTED Final   Enterobacterales NOT DETECTED NOT DETECTED Final   Enterobacter cloacae complex NOT DETECTED  NOT DETECTED Final   Escherichia coli NOT DETECTED NOT DETECTED Final   Klebsiella aerogenes NOT DETECTED NOT DETECTED Final   Klebsiella oxytoca NOT DETECTED NOT DETECTED Final   Klebsiella pneumoniae NOT DETECTED NOT DETECTED Final   Proteus species NOT DETECTED NOT DETECTED Final   Salmonella species NOT DETECTED NOT DETECTED Final   Serratia marcescens NOT DETECTED NOT DETECTED Final   Haemophilus influenzae NOT DETECTED NOT DETECTED Final   Neisseria meningitidis NOT DETECTED NOT DETECTED Final   Pseudomonas aeruginosa NOT DETECTED NOT DETECTED Final   Stenotrophomonas maltophilia NOT DETECTED NOT DETECTED Final   Candida albicans NOT DETECTED NOT DETECTED Final   Candida auris NOT DETECTED NOT DETECTED Final   Candida glabrata NOT DETECTED NOT DETECTED Final   Candida krusei NOT DETECTED NOT DETECTED Final   Candida parapsilosis NOT DETECTED NOT DETECTED Final   Candida tropicalis NOT DETECTED NOT DETECTED Final   Cryptococcus neoformans/gattii NOT DETECTED NOT DETECTED Final   Methicillin resistance mecA/C DETECTED (A) NOT DETECTED Final    Comment: CRITICAL RESULT CALLED TO, READ BACK BY AND VERIFIED WITH: RN J ALGHALI @0058  10/14/19 BY S GEZAHEGN  Performed at [facility], [address], [place], [place] [address]   Culture, respiratory (non-expectorated)     Status: None   Collection Time: 11/12/19 10:23 PM   Specimen: Tracheal Aspirate; Respiratory  Result Value Ref Range Status   Specimen Description TRACHEAL ASPIRATE  Final    Special Requests NONE  Final   Gram Stain   Final    RARE WBC PRESENT,BOTH PMN AND MONONUCLEAR NO ORGANISMS SEEN Performed at [facility], [address] [place], [place] [address]    Culture FEW CANDIDA ALBICANS  Final   Report Status 11/15/2019 FINAL  Final  Culture, blood (routine x 2)     Status: None   Collection Time: 11/15/19  3:24 PM   Specimen: BLOOD RIGHT HAND  Result Value Ref Range Status   Specimen Description BLOOD RIGHT HAND  Final   Special Requests   Final    BOTTLES DRAWN AEROBIC AND ANAEROBIC Blood Culture adequate volume   Culture   Final    NO GROWTH 5 DAYS Performed at [facility], [place] [address] [place], [place] [address]    Report Status 11/20/2019 FINAL  Final  Culture, blood (routine x 2)     Status: None   Collection Time: 11/15/19  3:27 PM   Specimen: BLOOD LEFT HAND  Result Value Ref Range Status   Specimen Description BLOOD LEFT HAND  Final   Special Requests   Final    BOTTLES DRAWN AEROBIC AND ANAEROBIC Blood Culture adequate volume   Culture   Final    NO GROWTH 5 DAYS Performed at [facility], [place] [address] [place], [place] [address]    Report Status 11/20/2019 FINAL  Final  Culture, Urine     Status: None   Collection Time: 11/20/19  4:05 PM   Specimen: Urine, Random  Result Value Ref Range Status   Specimen Description URINE, RANDOM  Final   Special Requests NONE  Final   Culture   Final    NO GROWTH Performed at [facility], [place] [address] [place],  [address]    Report Status 11/21/2019 FINAL  Final  Culture, blood (routine x 2)     Status: None   Collection Time: 11/20/19  4:08 PM   Specimen: BLOOD  Result Value Ref  Range Status   Specimen Description BLOOD SITE NOT SPECIFIED  Final   Special Requests   Final    BOTTLES DRAWN AEROBIC AND ANAEROBIC Blood Culture adequate volume   Culture   Final    NO GROWTH 5 DAYS Performed at [facility], [address], [place], [place]  [address]    Report Status 11/25/2019 FINAL  Final  Culture, blood (routine x 2)     Status: None   Collection Time: 11/20/19  4:15 PM   Specimen: BLOOD  Result Value Ref Range Status   Specimen Description BLOOD SITE NOT SPECIFIED  Final   Special Requests   Final    BOTTLES DRAWN AEROBIC AND ANAEROBIC Blood Culture results may not be optimal due to an excessive volume of blood received in culture bottles   Culture   Final    NO GROWTH 5 DAYS Performed at [facility], [place] [address], [place], [place] [address]    Report Status 11/25/2019 FINAL  Final  Culture, respiratory (non-expectorated)     Status: None   Collection Time: 11/20/19  6:43 PM   Specimen: Tracheal Aspirate; Respiratory  Result Value Ref Range Status   Specimen Description TRACHEAL ASPIRATE  Final   Special Requests NONE  Final   Gram Stain   Final    FEW WBC PRESENT,BOTH PMN AND MONONUCLEAR NO ORGANISMS SEEN Performed at [facility], [address] [place], [place] [address]    Culture RARE CANDIDA ALBICANS  Final   Report Status 11/24/2019 FINAL  Final    Coagulation Studies: No results for input(s): LABPROT, INR in the last 72 hours.  Urinalysis: No results for input(s): COLORURINE, LABSPEC, PHURINE, GLUCOSEU, HGBUR, BILIRUBINUR, KETONESUR, PROTEINUR, UROBILINOGEN, NITRITE, LEUKOCYTESUR in the last 72 hours.  Invalid input(s): APPERANCEUR    Imaging: No results found.   Medications:       Assessment/ Plan:  69 y.o. female with a PMHx of diabetes mellitus type 2, hyperlipidemia, plantar fasciitis, B12 deficiency, chronic low back pain, restless leg syndrome, GERD, nonalcoholic fatty liver disease, anxiety, nephrolithiasis, irritable bowel syndrome, hypertension, chronic kidney disease stage IIIa, who was admitted to [facility] on 11/05/2019 for ongoing treatment of post COVID-19 recovery.  1.  Acute kidney injury/chronic kidney disease stage IIIa.    BUN and creatinine continue to trend  up.  Reduce Lasix to 40 mg daily.  Consider stopping if BUN and creatinine continue to rise.  2.  Acute respiratory failure secondary to COVID-19 infection.    Patient continues to require ventilatory support.  Weaning as per pulmonary/critical care.  3.  Anemia of chronic kidney disease.    Hemoglobin now up to 9.5.  4.  Secondary hyperparathyroidism.  Phosphorus currently down to 6.8.  Continue to monitor phosphorus trend.  5.  Bacteremia.  1 or 2 culture shows Staph epidermidis.  Dialysis catheter was removed.    LOS: 0 Tearra Ouk 11/19/20218:24 AM

## 2019-12-03 ENCOUNTER — Other Ambulatory Visit: Payer: Self-pay

## 2019-12-03 DIAGNOSIS — J9621 Acute and chronic respiratory failure with hypoxia: Secondary | ICD-10-CM | POA: Diagnosis not present

## 2019-12-03 DIAGNOSIS — U071 COVID-19: Secondary | ICD-10-CM | POA: Diagnosis not present

## 2019-12-03 DIAGNOSIS — Z9911 Dependence on respirator [ventilator] status: Secondary | ICD-10-CM | POA: Diagnosis not present

## 2019-12-03 DIAGNOSIS — N186 End stage renal disease: Secondary | ICD-10-CM | POA: Diagnosis not present

## 2019-12-03 LAB — BASIC METABOLIC PANEL WITH GFR
Anion gap: 19 — ABNORMAL HIGH (ref 5–15)
Anion gap: 17 — ABNORMAL HIGH (ref 5–15)
BUN: 177 mg/dL — ABNORMAL HIGH (ref 8–23)
BUN: 171 mg/dL — ABNORMAL HIGH (ref 8–23)
CO2: 37 mmol/L — ABNORMAL HIGH (ref 22–32)
CO2: 36 mmol/L — ABNORMAL HIGH (ref 22–32)
Calcium: 11.5 mg/dL — ABNORMAL HIGH (ref 8.9–10.3)
Calcium: 11.6 mg/dL — ABNORMAL HIGH (ref 8.9–10.3)
Chloride: 83 mmol/L — ABNORMAL LOW (ref 98–111)
Chloride: 86 mmol/L — ABNORMAL LOW (ref 98–111)
Creatinine, Ser: 2.99 mg/dL — ABNORMAL HIGH (ref 0.44–1.00)
Creatinine, Ser: 2.88 mg/dL — ABNORMAL HIGH (ref 0.44–1.00)
GFR, Estimated: 16 mL/min — ABNORMAL LOW
GFR, Estimated: 17 mL/min — ABNORMAL LOW
Glucose, Bld: 196 mg/dL — ABNORMAL HIGH (ref 70–99)
Glucose, Bld: 196 mg/dL — ABNORMAL HIGH (ref 70–99)
Potassium: 5 mmol/L (ref 3.5–5.1)
Potassium: 4.9 mmol/L (ref 3.5–5.1)
Sodium: 139 mmol/L (ref 135–145)
Sodium: 139 mmol/L (ref 135–145)

## 2019-12-03 LAB — CBC
HCT: 32.6 % — ABNORMAL LOW (ref 36.0–46.0)
Hemoglobin: 9.8 g/dL — ABNORMAL LOW (ref 12.0–15.0)
MCH: 27.9 pg (ref 26.0–34.0)
MCHC: 30.1 g/dL (ref 30.0–36.0)
MCV: 92.9 fL (ref 80.0–100.0)
Platelets: 374 K/uL (ref 150–400)
RBC: 3.51 MIL/uL — ABNORMAL LOW (ref 3.87–5.11)
RDW: 21.6 % — ABNORMAL HIGH (ref 11.5–15.5)
WBC: 10.5 K/uL (ref 4.0–10.5)
nRBC: 0 % (ref 0.0–0.2)

## 2019-12-03 LAB — TSH: TSH: 7.727 u[IU]/mL — ABNORMAL HIGH (ref 0.350–4.500)

## 2019-12-03 LAB — T4, FREE: Free T4: 0.5 ng/dL — ABNORMAL LOW (ref 0.61–1.12)

## 2019-12-03 LAB — MAGNESIUM
Magnesium: 3 mg/dL — ABNORMAL HIGH (ref 1.7–2.4)
Magnesium: 2.7 mg/dL — ABNORMAL HIGH (ref 1.7–2.4)

## 2019-12-03 LAB — TRIGLYCERIDES: Triglycerides: 517 mg/dL — ABNORMAL HIGH (ref <150)

## 2019-12-03 NOTE — Progress Notes (Signed)
Pulmonary Critical Care Medicine [place]   PULMONARY CRITICAL CARE SERVICE  PROGRESS NOTE  Date of Service: 12/03/2019  Veronica Melton  TAV:[id number]  DOB: 13-Apr-1950   DOA: 11/05/2019  Referring Physician: Merton Border, MD  HPI: Veronica Melton is a 69 y.o. female seen for follow up of Acute on Chronic Respiratory Failure.  Patient was able to do 6 hours of pressure support yesterday now is on pressure control will be resuming the wean today.  Medications: Reviewed on Rounds  Physical Exam:  Vitals: Temperature is 97.7 pulse 106 respiratory rate 30 blood pressures 145/75 saturations 97%  Ventilator Settings on pressure control FiO2 40% IP 21 PEEP 5  . General: Comfortable at this time . Eyes: Grossly normal lids, irises & conjunctiva . ENT: grossly tongue is normal . Neck: no obvious mass . Cardiovascular: S1 S2 normal no gallop . Respiratory: No rhonchi no rales are noted . Abdomen: soft . Skin: no rash seen on limited exam . Musculoskeletal: not rigid . Psychiatric:unable to assess . Neurologic: no seizure no involuntary movements         Lab Data:   Basic Metabolic Panel: Recent Labs  Lab 11/27/19 0459 11/28/19 0647 11/30/19 0457 12/02/19 0353  NA 136 132* 132* 139  K 4.1 4.2 4.4 4.4  CL 90* 84* 87* 87*  CO2 31 29 29  34*  GLUCOSE 153* 166* 183* 200*  BUN 67* 82* 119* 150*  CREATININE 1.66* 1.84* 2.52* 2.91*  CALCIUM 9.5 9.8 10.1 10.9*  MG 2.4 2.2 2.4 2.7*  PHOS 4.7* 5.2* 7.1* 6.8*    ABG: No results for input(s): PHART, PCO2ART, PO2ART, HCO3, O2SAT in the last 168 hours.  Liver Function Tests: Recent Labs  Lab 11/27/19 0459 11/28/19 0647 11/30/19 0457 12/02/19 0353  ALBUMIN 3.0* 3.1* 2.9* 3.2*   No results for input(s): LIPASE, AMYLASE in the last 168 hours. No results for input(s): AMMONIA in the last 168 hours.  CBC: Recent Labs  Lab 11/28/19 0647 11/30/19 0457 12/02/19 0353  WBC 9.8 9.7 10.3  HGB 9.4* 8.7* 9.5*   HCT 30.9* 28.3* 31.6*  MCV 89.6 91.0 92.1  PLT 261 263 321    Cardiac Enzymes: No results for input(s): CKTOTAL, CKMB, CKMBINDEX, TROPONINI in the last 168 hours.  BNP (last 3 results) No results for input(s): BNP in the last 8760 hours.  ProBNP (last 3 results) No results for input(s): PROBNP in the last 8760 hours.  Radiological Exams: No results found.  Assessment/Plan Active Problems:   Acute on chronic respiratory failure with hypoxia (HCC)   End stage renal disease on dialysis ([place])   COVID-19 virus infection   Pneumonia due to COVID-19 virus   1. Acute on chronic respiratory failure with hypoxia try pressure support weaning again today completed 6 hours yesterday able to 8 to 12 hours today 2. End-stage renal failure on hemodialysis continue to monitor 3. Covid virus infection in recovery 4. Pneumonia due to COVID-19 treated   I have personally seen and evaluated the patient, evaluated laboratory and imaging results, formulated the assessment and plan and placed orders. The Patient requires high complexity decision making with multiple systems involvement.  Rounds were done with the Respiratory Therapy Director and Staff therapists and discussed with nursing staff also.  Allyne Gee, MD [facility] Pulmonary Critical Care Medicine Sleep Medicine

## 2019-12-04 DIAGNOSIS — J9621 Acute and chronic respiratory failure with hypoxia: Secondary | ICD-10-CM | POA: Diagnosis not present

## 2019-12-04 DIAGNOSIS — U071 COVID-19: Secondary | ICD-10-CM | POA: Diagnosis not present

## 2019-12-04 DIAGNOSIS — N186 End stage renal disease: Secondary | ICD-10-CM | POA: Diagnosis not present

## 2019-12-04 DIAGNOSIS — Z9911 Dependence on respirator [ventilator] status: Secondary | ICD-10-CM | POA: Diagnosis not present

## 2019-12-04 LAB — COMPREHENSIVE METABOLIC PANEL
ALT: 25 U/L (ref 0–44)
AST: 22 U/L (ref 15–41)
Albumin: 3.6 g/dL (ref 3.5–5.0)
Alkaline Phosphatase: 81 U/L (ref 38–126)
Anion gap: 18 — ABNORMAL HIGH (ref 5–15)
BUN: 176 mg/dL — ABNORMAL HIGH (ref 8–23)
CO2: 36 mmol/L — ABNORMAL HIGH (ref 22–32)
Calcium: 12.2 mg/dL — ABNORMAL HIGH (ref 8.9–10.3)
Chloride: 87 mmol/L — ABNORMAL LOW (ref 98–111)
Creatinine, Ser: 3 mg/dL — ABNORMAL HIGH (ref 0.44–1.00)
GFR, Estimated: 16 mL/min — ABNORMAL LOW (ref >60)
Glucose, Bld: 173 mg/dL — ABNORMAL HIGH (ref 70–99)
Potassium: 5.1 mmol/L (ref 3.5–5.1)
Sodium: 141 mmol/L (ref 135–145)
Total Bilirubin: 0.9 mg/dL (ref 0.3–1.2)
Total Protein: 7.5 g/dL (ref 6.5–8.1)

## 2019-12-04 LAB — CBC
HCT: 35.2 % — ABNORMAL LOW (ref 36.0–46.0)
Hemoglobin: 10.6 g/dL — ABNORMAL LOW (ref 12.0–15.0)
MCH: 27.7 pg (ref 26.0–34.0)
MCHC: 30.1 g/dL (ref 30.0–36.0)
MCV: 91.9 fL (ref 80.0–100.0)
Platelets: 366 10*3/uL (ref 150–400)
RBC: 3.83 MIL/uL — ABNORMAL LOW (ref 3.87–5.11)
RDW: 21.3 % — ABNORMAL HIGH (ref 11.5–15.5)
WBC: 12.1 10*3/uL — ABNORMAL HIGH (ref 4.0–10.5)
nRBC: 0 % (ref 0.0–0.2)

## 2019-12-04 LAB — MAGNESIUM: Magnesium: 3 mg/dL — ABNORMAL HIGH (ref 1.7–2.4)

## 2019-12-04 LAB — PHOSPHORUS: Phosphorus: 6.7 mg/dL — ABNORMAL HIGH (ref 2.5–4.6)

## 2019-12-04 NOTE — Progress Notes (Signed)
Pulmonary Critical Care Medicine [place]   PULMONARY CRITICAL CARE SERVICE  PROGRESS NOTE  Date of Service: 12/04/2019  Veronica Melton  YKD:[id number]  DOB: 07/24/1950   DOA: 11/05/2019  Referring Physician: Merton Border, MD  HPI: Veronica Melton is a 69 y.o. female seen for follow up of Acute on Chronic Respiratory Failure.  Patient's been on the weaning protocol goal is for 12 hours currently on pressure support with an FiO2 of 40%  Medications: Reviewed on Rounds  Physical Exam:  Vitals: Temperature is 96.2 pulse 108 respiratory rate 26 blood pressure is 160/62 saturations 99%  Ventilator Settings on pressure support FiO2 40% pressure 12/5  . General: Comfortable at this time . Eyes: Grossly normal lids, irises & conjunctiva . ENT: grossly tongue is normal . Neck: no obvious mass . Cardiovascular: S1 S2 normal no gallop . Respiratory: No rhonchi no rales noted . Abdomen: soft . Skin: no rash seen on limited exam . Musculoskeletal: not rigid . Psychiatric:unable to assess . Neurologic: no seizure no involuntary movements         Lab Data:   Basic Metabolic Panel: Recent Labs  Lab 11/28/19 0647 11/28/19 0647 11/30/19 0457 12/02/19 0353 12/03/19 1422 12/03/19 1818 12/04/19 0720  NA 132*   < > 132* 139 139 139 141  K 4.2   < > 4.4 4.4 5.0 4.9 5.1  CL 84*   < > 87* 87* 83* 86* 87*  CO2 29   < > 29 34* 37* 36* 36*  GLUCOSE 166*   < > 183* 200* 196* 196* 173*  BUN 82*   < > 119* 150* 177* 171* 176*  CREATININE 1.84*   < > 2.52* 2.91* 2.99* 2.88* 3.00*  CALCIUM 9.8   < > 10.1 10.9* 11.5* 11.6* 12.2*  MG 2.2   < > 2.4 2.7* 3.0* 2.7* 3.0*  PHOS 5.2*  --  7.1* 6.8*  --   --   --    < > = values in this interval not displayed.    ABG: No results for input(s): PHART, PCO2ART, PO2ART, HCO3, O2SAT in the last 168 hours.  Liver Function Tests: Recent Labs  Lab 11/28/19 0647 11/30/19 0457 12/02/19 0353 12/04/19 0720  AST  --   --   --  22   ALT  --   --   --  25  ALKPHOS  --   --   --  81  BILITOT  --   --   --  0.9  PROT  --   --   --  7.5  ALBUMIN 3.1* 2.9* 3.2* 3.6   No results for input(s): LIPASE, AMYLASE in the last 168 hours. No results for input(s): AMMONIA in the last 168 hours.  CBC: Recent Labs  Lab 11/28/19 0647 11/30/19 0457 12/02/19 0353 12/03/19 1818 12/04/19 0720  WBC 9.8 9.7 10.3 10.5 12.1*  HGB 9.4* 8.7* 9.5* 9.8* 10.6*  HCT 30.9* 28.3* 31.6* 32.6* 35.2*  MCV 89.6 91.0 92.1 92.9 91.9  PLT 261 263 321 374 366    Cardiac Enzymes: No results for input(s): CKTOTAL, CKMB, CKMBINDEX, TROPONINI in the last 168 hours.  BNP (last 3 results) No results for input(s): BNP in the last 8760 hours.  ProBNP (last 3 results) No results for input(s): PROBNP in the last 8760 hours.  Radiological Exams: No results found.  Assessment/Plan Active Problems:   Acute on chronic respiratory failure with hypoxia (HCC)   End stage renal disease on dialysis (  [place])   COVID-19 virus infection   Pneumonia due to COVID-19 virus   1. Acute on chronic respiratory failure hypoxia goal of 12 hours pressure support 2. End-stage renal failure on hemodialysis 3. COVID-19 virus infection in recovery 4. Pneumonia due to COVID-19 treated we will continue to monitor   I have personally seen and evaluated the patient, evaluated laboratory and imaging results, formulated the assessment and plan and placed orders. The Patient requires high complexity decision making with multiple systems involvement.  Rounds were done with the Respiratory Therapy Director and Staff therapists and discussed with nursing staff also.  Allyne Gee, MD [facility] Pulmonary Critical Care Medicine Sleep Medicine

## 2019-12-05 DIAGNOSIS — Z9911 Dependence on respirator [ventilator] status: Secondary | ICD-10-CM | POA: Diagnosis not present

## 2019-12-05 DIAGNOSIS — U071 COVID-19: Secondary | ICD-10-CM | POA: Diagnosis not present

## 2019-12-05 DIAGNOSIS — N186 End stage renal disease: Secondary | ICD-10-CM | POA: Diagnosis not present

## 2019-12-05 DIAGNOSIS — J9621 Acute and chronic respiratory failure with hypoxia: Secondary | ICD-10-CM | POA: Diagnosis not present

## 2019-12-05 LAB — RENAL FUNCTION PANEL
Albumin: 3.3 g/dL — ABNORMAL LOW (ref 3.5–5.0)
Anion gap: 18 — ABNORMAL HIGH (ref 5–15)
BUN: 170 mg/dL — ABNORMAL HIGH (ref 8–23)
CO2: 37 mmol/L — ABNORMAL HIGH (ref 22–32)
Calcium: 11.5 mg/dL — ABNORMAL HIGH (ref 8.9–10.3)
Chloride: 89 mmol/L — ABNORMAL LOW (ref 98–111)
Creatinine, Ser: 2.56 mg/dL — ABNORMAL HIGH (ref 0.44–1.00)
GFR, Estimated: 20 mL/min — ABNORMAL LOW (ref >60)
Glucose, Bld: 157 mg/dL — ABNORMAL HIGH (ref 70–99)
Phosphorus: 5.8 mg/dL — ABNORMAL HIGH (ref 2.5–4.6)
Potassium: 4.8 mmol/L (ref 3.5–5.1)
Sodium: 144 mmol/L (ref 135–145)

## 2019-12-05 LAB — MAGNESIUM: Magnesium: 3 mg/dL — ABNORMAL HIGH (ref 1.7–2.4)

## 2019-12-05 NOTE — Progress Notes (Signed)
[facility]  ROUNDING NOTE   Subjective:  BUN and creatinine still quite high at 176 and 3.0 respectively. Still making good urine however.   Objective:  Vital signs in last 24 hours:  Temperature 98.5 pulse 117 respirations 30 blood pressure 166/69  Physical Exam: General:  Critically ill-appearing  Head:  Normocephalic, atraumatic. Moist oral mucosal membranes  Eyes:  Anicteric  Neck:  Tracheostomy in place  Lungs:   Scattered rhonchi, vent assisted  Heart:  S1S2 no rubs  Abdomen:   Soft, nontender, bowel sounds present  Extremities:  Trace peripheral edema.  Neurologic:  Awake, alert  Skin:  No acute rashes  Access:  No dialysis access    Basic Metabolic Panel: Recent Labs  Lab 11/30/19 0457 11/30/19 0457 12/02/19 0353 12/02/19 0353 12/03/19 1422 12/03/19 1818 12/04/19 0720 12/05/19 0638  NA 132*  --  139  --  139 139 141  --   K 4.4  --  4.4  --  5.0 4.9 5.1  --   CL 87*  --  87*  --  83* 86* 87*  --   CO2 29  --  34*  --  37* 36* 36*  --   GLUCOSE 183*  --  200*  --  196* 196* 173*  --   BUN 119*  --  150*  --  177* 171* 176*  --   CREATININE 2.52*  --  2.91*  --  2.99* 2.88* 3.00*  --   CALCIUM 10.1   < > 10.9*   < > 11.5* 11.6* 12.2*  --   MG 2.4   < > 2.7*  --  3.0* 2.7* 3.0* 3.0*  PHOS 7.1*  --  6.8*  --   --   --  6.7*  --    < > = values in this interval not displayed.    Liver Function Tests: Recent Labs  Lab 11/30/19 0457 12/02/19 0353 12/04/19 0720  AST  --   --  22  ALT  --   --  25  ALKPHOS  --   --  81  BILITOT  --   --  0.9  PROT  --   --  7.5  ALBUMIN 2.9* 3.2* 3.6   No results for input(s): LIPASE, AMYLASE in the last 168 hours. No results for input(s): AMMONIA in the last 168 hours.  CBC: Recent Labs  Lab 11/30/19 0457 12/02/19 0353 12/03/19 1818 12/04/19 0720  WBC 9.7 10.3 10.5 12.1*  HGB 8.7* 9.5* 9.8* 10.6*  HCT 28.3* 31.6* 32.6* 35.2*  MCV 91.0 92.1 92.9 91.9  PLT 263 321 374 366    Cardiac  Enzymes: No results for input(s): CKTOTAL, CKMB, CKMBINDEX, TROPONINI in the last 168 hours.  BNP: Invalid input(s): POCBNP  CBG: No results for input(s): GLUCAP in the last 168 hours.  Microbiology: Results for orders placed or performed during the hospital encounter of 11/05/19  Culture, respiratory (non-expectorated)     Status: None   Collection Time: 11/06/19  1:46 PM   Specimen: Tracheal Aspirate; Respiratory  Result Value Ref Range Status   Specimen Description TRACHEAL ASPIRATE  Final   Special Requests NONE  Final   Gram Stain   Final    RARE WBC PRESENT, PREDOMINANTLY PMN NO ORGANISMS SEEN    Culture   Final    RARE Normal respiratory flora-no Staph aureus or Pseudomonas seen Performed at [facility] [address], [place], [place] [address]  Report Status 11/08/2019 FINAL  Final  Urine Culture     Status: Abnormal   Collection Time: 11/10/19  1:36 PM   Specimen: Urine, Random  Result Value Ref Range Status   Specimen Description URINE, RANDOM  Final   Special Requests   Final    NONE Performed at [facility], [address], [place], [place] [address]    Culture 60,000 COLONIES/mL YEAST (A)  Final   Report Status 11/11/2019 FINAL  Final  Culture, blood (routine x 2)     Status: None   Collection Time: 11/12/19 10:00 PM   Specimen: BLOOD  Result Value Ref Range Status   Specimen Description BLOOD SITE NOT SPECIFIED  Final   Special Requests   Final    BOTTLES DRAWN AEROBIC AND ANAEROBIC Blood Culture results may not be optimal due to an excessive volume of blood received in culture bottles   Culture   Final    NO GROWTH 5 DAYS Performed at [facility], [place] [address] [place], [place] [address]    Report Status 11/17/2019 FINAL  Final  Culture, blood (routine x 2)     Status: Abnormal   Collection Time: 11/12/19 10:06 PM   Specimen: BLOOD  Result Value Ref Range Status   Specimen Description BLOOD SITE NOT SPECIFIED  Final    Special Requests   Final    BOTTLES DRAWN AEROBIC AND ANAEROBIC Blood Culture results may not be optimal due to an excessive volume of blood received in culture bottles   Culture  Setup Time   Final    GRAM POSITIVE COCCI ANAEROBIC BOTTLE ONLY CRITICAL RESULT CALLED TO, READ BACK BY AND VERIFIED WITH: RN J ALGHALI @0058  10/14/19 BY S GEZAHEGN  Performed at [facility], [place] [address], [place], [place] [address]    Culture STAPHYLOCOCCUS EPIDERMIDIS (A)  Final   Report Status 11/16/2019 FINAL  Final   Organism ID, Bacteria STAPHYLOCOCCUS EPIDERMIDIS  Final      Susceptibility   Staphylococcus epidermidis - MIC*    CIPROFLOXACIN >=8 RESISTANT Resistant     ERYTHROMYCIN >=8 RESISTANT Resistant     GENTAMICIN 8 INTERMEDIATE Intermediate     OXACILLIN >=4 RESISTANT Resistant     TETRACYCLINE 2 SENSITIVE Sensitive     VANCOMYCIN 1 SENSITIVE Sensitive     TRIMETH/SULFA 80 RESISTANT Resistant     CLINDAMYCIN >=8 RESISTANT Resistant     RIFAMPIN <=0.5 SENSITIVE Sensitive     Inducible Clindamycin NEGATIVE Sensitive     * STAPHYLOCOCCUS EPIDERMIDIS  Blood Culture ID Panel (Reflexed)     Status: Abnormal   Collection Time: 11/12/19 10:06 PM  Result Value Ref Range Status   Enterococcus faecalis NOT DETECTED NOT DETECTED Final   Enterococcus Faecium NOT DETECTED NOT DETECTED Final   Listeria monocytogenes NOT DETECTED NOT DETECTED Final   Staphylococcus species DETECTED (A) NOT DETECTED Final    Comment: CRITICAL RESULT CALLED TO, READ BACK BY AND VERIFIED WITH: RN J ALGHALI @0058  10/14/19 BY S GEZAHEGN     Staphylococcus aureus (BCID) NOT DETECTED NOT DETECTED Final   Staphylococcus epidermidis DETECTED (A) NOT DETECTED Final    Comment: Methicillin (oxacillin) resistant coagulase negative staphylococcus. Possible blood culture contaminant (unless isolated from more than one blood culture draw or clinical case suggests pathogenicity). No antibiotic treatment is indicated for blood   culture contaminants. CRITICAL RESULT CALLED TO, READ BACK BY AND VERIFIED WITH: RN J ALGHALI @0058  10/14/19 BY S GEZAHEGN  Staphylococcus lugdunensis NOT DETECTED NOT DETECTED Final   Streptococcus species NOT DETECTED NOT DETECTED Final   Streptococcus agalactiae NOT DETECTED NOT DETECTED Final   Streptococcus pneumoniae NOT DETECTED NOT DETECTED Final   Streptococcus pyogenes NOT DETECTED NOT DETECTED Final   A.calcoaceticus-baumannii NOT DETECTED NOT DETECTED Final   Bacteroides fragilis NOT DETECTED NOT DETECTED Final   Enterobacterales NOT DETECTED NOT DETECTED Final   Enterobacter cloacae complex NOT DETECTED NOT DETECTED Final   Escherichia coli NOT DETECTED NOT DETECTED Final   Klebsiella aerogenes NOT DETECTED NOT DETECTED Final   Klebsiella oxytoca NOT DETECTED NOT DETECTED Final   Klebsiella pneumoniae NOT DETECTED NOT DETECTED Final   Proteus species NOT DETECTED NOT DETECTED Final   Salmonella species NOT DETECTED NOT DETECTED Final   Serratia marcescens NOT DETECTED NOT DETECTED Final   Haemophilus influenzae NOT DETECTED NOT DETECTED Final   Neisseria meningitidis NOT DETECTED NOT DETECTED Final   Pseudomonas aeruginosa NOT DETECTED NOT DETECTED Final   Stenotrophomonas maltophilia NOT DETECTED NOT DETECTED Final   Candida albicans NOT DETECTED NOT DETECTED Final   Candida auris NOT DETECTED NOT DETECTED Final   Candida glabrata NOT DETECTED NOT DETECTED Final   Candida krusei NOT DETECTED NOT DETECTED Final   Candida parapsilosis NOT DETECTED NOT DETECTED Final   Candida tropicalis NOT DETECTED NOT DETECTED Final   Cryptococcus neoformans/gattii NOT DETECTED NOT DETECTED Final   Methicillin resistance mecA/C DETECTED (A) NOT DETECTED Final    Comment: CRITICAL RESULT CALLED TO, READ BACK BY AND VERIFIED WITH: RN J ALGHALI @0058  10/14/19 BY S GEZAHEGN  Performed at [facility], [address] [place], [place] [address]   Culture, respiratory  (non-expectorated)     Status: None   Collection Time: 11/12/19 10:23 PM   Specimen: Tracheal Aspirate; Respiratory  Result Value Ref Range Status   Specimen Description TRACHEAL ASPIRATE  Final   Special Requests NONE  Final   Gram Stain   Final    RARE WBC PRESENT,BOTH PMN AND MONONUCLEAR NO ORGANISMS SEEN Performed at [facility], [address], [place], [place] [address]    Culture FEW CANDIDA ALBICANS  Final   Report Status 11/15/2019 FINAL  Final  Culture, blood (routine x 2)     Status: None   Collection Time: 11/15/19  3:24 PM   Specimen: BLOOD RIGHT HAND  Result Value Ref Range Status   Specimen Description BLOOD RIGHT HAND  Final   Special Requests   Final    BOTTLES DRAWN AEROBIC AND ANAEROBIC Blood Culture adequate volume   Culture   Final    NO GROWTH 5 DAYS Performed at [facility], [place] [address] [place], [place] [address]    Report Status 11/20/2019 FINAL  Final  Culture, blood (routine x 2)     Status: None   Collection Time: 11/15/19  3:27 PM   Specimen: BLOOD LEFT HAND  Result Value Ref Range Status   Specimen Description BLOOD LEFT HAND  Final   Special Requests   Final    BOTTLES DRAWN AEROBIC AND ANAEROBIC Blood Culture adequate volume   Culture   Final    NO GROWTH 5 DAYS Performed at [facility], [place] [address] [place], [place] [address]    Report Status 11/20/2019 FINAL  Final  Culture, Urine     Status: None   Collection Time: 11/20/19  4:05 PM   Specimen: Urine, Random  Result Value Ref Range Status  Specimen Description URINE, RANDOM  Final   Special Requests NONE  Final   Culture   Final    NO GROWTH Performed at [facility], [place] [address], [place], [place] [address]    Report Status 11/21/2019 FINAL  Final  Culture, blood (routine x 2)     Status: None   Collection Time: 11/20/19  4:08 PM   Specimen: BLOOD  Result Value Ref Range Status   Specimen Description BLOOD SITE NOT SPECIFIED  Final   Special  Requests   Final    BOTTLES DRAWN AEROBIC AND ANAEROBIC Blood Culture adequate volume   Culture   Final    NO GROWTH 5 DAYS Performed at [facility], [place] [address] [place], [place] [address]    Report Status 11/25/2019 FINAL  Final  Culture, blood (routine x 2)     Status: None   Collection Time: 11/20/19  4:15 PM   Specimen: BLOOD  Result Value Ref Range Status   Specimen Description BLOOD SITE NOT SPECIFIED  Final   Special Requests   Final    BOTTLES DRAWN AEROBIC AND ANAEROBIC Blood Culture results may not be optimal due to an excessive volume of blood received in culture bottles   Culture   Final    NO GROWTH 5 DAYS Performed at [facility], [place] [address], [place], [place] [address]    Report Status 11/25/2019 FINAL  Final  Culture, respiratory (non-expectorated)     Status: None   Collection Time: 11/20/19  6:43 PM   Specimen: Tracheal Aspirate; Respiratory  Result Value Ref Range Status   Specimen Description TRACHEAL ASPIRATE  Final   Special Requests NONE  Final   Gram Stain   Final    FEW WBC PRESENT,BOTH PMN AND MONONUCLEAR NO ORGANISMS SEEN Performed at [facility], [address], [place], [place] [address]    Culture RARE CANDIDA ALBICANS  Final   Report Status 11/24/2019 FINAL  Final    Coagulation Studies: No results for input(s): LABPROT, INR in the last 72 hours.  Urinalysis: No results for input(s): COLORURINE, LABSPEC, PHURINE, GLUCOSEU, HGBUR, BILIRUBINUR, KETONESUR, PROTEINUR, UROBILINOGEN, NITRITE, LEUKOCYTESUR in the last 72 hours.  Invalid input(s): APPERANCEUR    Imaging: No results found.   Medications:       Assessment/ Plan:  69 y.o. female with a PMHx of diabetes mellitus type 2, hyperlipidemia, plantar fasciitis, B12 deficiency, chronic low back pain, restless leg syndrome, GERD, nonalcoholic fatty liver disease, anxiety, nephrolithiasis, irritable bowel syndrome, hypertension, chronic kidney disease stage  IIIa, who was admitted to [facility] on 11/05/2019 for ongoing treatment of post COVID-19 recovery.  1.  Acute kidney injury/chronic kidney disease stage IIIa.    BUN and creatinine higher over the weekend at 176 and 3.0 respectively.  Recommend holding diuretics.  Provide IV fluid hydration with 0.9 normal saline at 50 cc/h for at least 48 hours.  2.  Acute respiratory failure secondary to COVID-19 infection.    Still requiring vent support.  Difficult to wean from the ventilator.  3.  Anemia of chronic kidney disease.    Hemoglobin up to 10.6.  Continue to monitor.  4.  Secondary hyperparathyroidism.  Phosphorus high at 6.7.  Likely due to acute renal failure.  5.  Bacteremia.  1 or 2 culture shows Staph epidermidis.  Dialysis catheter was removed.    LOS: 0 Veronica Melton 11/22/20217:57 AM

## 2019-12-05 NOTE — Progress Notes (Signed)
Pulmonary Critical Care Medicine [place]   PULMONARY CRITICAL CARE SERVICE  PROGRESS NOTE  Date of Service: 12/05/2019  Debbe Crumble  LSL:[id number]  DOB: July 17, 1950   DOA: 11/05/2019  Referring Physician: Merton Border, MD  HPI: Veronica Melton is a 69 y.o. female seen for follow up of Acute on Chronic Respiratory Failure.  Patient is on pressure support currently on 12/5 with a goal of 16 hours  Medications: Reviewed on Rounds  Physical Exam:  Vitals: Temperature is 98.5 pulse 117 respiratory rate 30 blood pressure is 160/69 saturations 98%  Ventilator Settings on pressure support FiO2 is 30% pressure 12/5  . General: Comfortable at this time . Eyes: Grossly normal lids, irises & conjunctiva . ENT: grossly tongue is normal . Neck: no obvious mass . Cardiovascular: S1 S2 normal no gallop . Respiratory: No rhonchi coarse breath sounds . Abdomen: soft . Skin: no rash seen on limited exam . Musculoskeletal: not rigid . Psychiatric:unable to assess . Neurologic: no seizure no involuntary movements         Lab Data:   Basic Metabolic Panel: Recent Labs  Lab 11/30/19 0457 11/30/19 0457 12/02/19 0353 12/03/19 1422 12/03/19 1818 12/04/19 0720 12/05/19 0638  NA 132*   < > 139 139 139 141 144  K 4.4   < > 4.4 5.0 4.9 5.1 4.8  CL 87*   < > 87* 83* 86* 87* 89*  CO2 29   < > 34* 37* 36* 36* 37*  GLUCOSE 183*   < > 200* 196* 196* 173* 157*  BUN 119*   < > 150* 177* 171* 176* 170*  CREATININE 2.52*   < > 2.91* 2.99* 2.88* 3.00* 2.56*  CALCIUM 10.1   < > 10.9* 11.5* 11.6* 12.2* 11.5*  MG 2.4   < > 2.7* 3.0* 2.7* 3.0* 3.0*  PHOS 7.1*  --  6.8*  --   --  6.7* 5.8*   < > = values in this interval not displayed.    ABG: No results for input(s): PHART, PCO2ART, PO2ART, HCO3, O2SAT in the last 168 hours.  Liver Function Tests: Recent Labs  Lab 11/30/19 0457 12/02/19 0353 12/04/19 0720 12/05/19 0638  AST  --   --  22  --   ALT  --   --  25  --    ALKPHOS  --   --  81  --   BILITOT  --   --  0.9  --   PROT  --   --  7.5  --   ALBUMIN 2.9* 3.2* 3.6 3.3*   No results for input(s): LIPASE, AMYLASE in the last 168 hours. No results for input(s): AMMONIA in the last 168 hours.  CBC: Recent Labs  Lab 11/30/19 0457 12/02/19 0353 12/03/19 1818 12/04/19 0720  WBC 9.7 10.3 10.5 12.1*  HGB 8.7* 9.5* 9.8* 10.6*  HCT 28.3* 31.6* 32.6* 35.2*  MCV 91.0 92.1 92.9 91.9  PLT 263 321 374 366    Cardiac Enzymes: No results for input(s): CKTOTAL, CKMB, CKMBINDEX, TROPONINI in the last 168 hours.  BNP (last 3 results) No results for input(s): BNP in the last 8760 hours.  ProBNP (last 3 results) No results for input(s): PROBNP in the last 8760 hours.  Radiological Exams: No results found.  Assessment/Plan Active Problems:   Acute on chronic respiratory failure with hypoxia (HCC)   End stage renal disease on dialysis ([place])   COVID-19 virus infection   Pneumonia due to COVID-19 virus  1. Acute on chronic respiratory failure with hypoxia we will continue with pressure support currently on 30% FiO2 as already noted the goal is for 16 hours 2. End-stage renal failure on hemodialysis 3. COVID-19 virus infection recovery 4. Pneumonia due to COVID-19 treated   I have personally seen and evaluated the patient, evaluated laboratory and imaging results, formulated the assessment and plan and placed orders. The Patient requires high complexity decision making with multiple systems involvement.  Rounds were done with the Respiratory Therapy Director and Staff therapists and discussed with nursing staff also.  Allyne Gee, MD [facility] Pulmonary Critical Care Medicine Sleep Medicine

## 2019-12-06 DIAGNOSIS — Z9911 Dependence on respirator [ventilator] status: Secondary | ICD-10-CM | POA: Diagnosis not present

## 2019-12-06 DIAGNOSIS — J9621 Acute and chronic respiratory failure with hypoxia: Secondary | ICD-10-CM | POA: Diagnosis not present

## 2019-12-06 DIAGNOSIS — N186 End stage renal disease: Secondary | ICD-10-CM | POA: Diagnosis not present

## 2019-12-06 DIAGNOSIS — U071 COVID-19: Secondary | ICD-10-CM | POA: Diagnosis not present

## 2019-12-06 LAB — TRIGLYCERIDES: Triglycerides: 730 mg/dL — ABNORMAL HIGH (ref <150)

## 2019-12-06 LAB — CBC
HCT: 36.4 % (ref 36.0–46.0)
Hemoglobin: 10.8 g/dL — ABNORMAL LOW (ref 12.0–15.0)
MCH: 27.8 pg (ref 26.0–34.0)
MCHC: 29.7 g/dL — ABNORMAL LOW (ref 30.0–36.0)
MCV: 93.8 fL (ref 80.0–100.0)
Platelets: 355 10*3/uL (ref 150–400)
RBC: 3.88 MIL/uL (ref 3.87–5.11)
RDW: 21.7 % — ABNORMAL HIGH (ref 11.5–15.5)
WBC: 14.5 10*3/uL — ABNORMAL HIGH (ref 4.0–10.5)
nRBC: 0.4 % — ABNORMAL HIGH (ref 0.0–0.2)

## 2019-12-06 LAB — RENAL FUNCTION PANEL
Albumin: 3.7 g/dL (ref 3.5–5.0)
Anion gap: 16 — ABNORMAL HIGH (ref 5–15)
BUN: 150 mg/dL — ABNORMAL HIGH (ref 8–23)
CO2: 35 mmol/L — ABNORMAL HIGH (ref 22–32)
Calcium: 11.9 mg/dL — ABNORMAL HIGH (ref 8.9–10.3)
Chloride: 96 mmol/L — ABNORMAL LOW (ref 98–111)
Creatinine, Ser: 2.39 mg/dL — ABNORMAL HIGH (ref 0.44–1.00)
GFR, Estimated: 21 mL/min — ABNORMAL LOW (ref >60)
Glucose, Bld: 193 mg/dL — ABNORMAL HIGH (ref 70–99)
Phosphorus: 5 mg/dL — ABNORMAL HIGH (ref 2.5–4.6)
Potassium: 4.3 mmol/L (ref 3.5–5.1)
Sodium: 147 mmol/L — ABNORMAL HIGH (ref 135–145)

## 2019-12-06 LAB — C DIFFICILE (CDIFF) QUICK SCRN (NO PCR REFLEX)
C Diff antigen: NEGATIVE
C Diff interpretation: NOT DETECTED
C Diff toxin: NEGATIVE

## 2019-12-06 LAB — MAGNESIUM: Magnesium: 2.8 mg/dL — ABNORMAL HIGH (ref 1.7–2.4)

## 2019-12-06 NOTE — Progress Notes (Signed)
Pulmonary Critical Care Medicine [place]   PULMONARY CRITICAL CARE SERVICE  PROGRESS NOTE  Date of Service: 12/06/2019  Veronica Melton  XAJ:[id number]  DOB: 06/20/1950   DOA: 11/05/2019  Referring Physician: Merton Border, MD  HPI: Veronica Melton is a 69 y.o. female seen for follow up of Acute on Chronic Respiratory Failure.  Patient currently is on pressure support has been on 28% FiO2 good saturations are noted at this time.  Medications: Reviewed on Rounds  Physical Exam:  Vitals: Temperature 98.1 pulse 98 respiratory rate 30 blood pressure is 150/74 saturations 97%  Ventilator Settings on pressure support FiO2 28% pressure 12/5  . General: Comfortable at this time . Eyes: Grossly normal lids, irises & conjunctiva . ENT: grossly tongue is normal . Neck: no obvious mass . Cardiovascular: S1 S2 normal no gallop . Respiratory: No rhonchi very coarse breath sounds . Abdomen: soft . Skin: no rash seen on limited exam . Musculoskeletal: not rigid . Psychiatric:unable to assess . Neurologic: no seizure no involuntary movements         Lab Data:   Basic Metabolic Panel: Recent Labs  Lab 11/30/19 0457 11/30/19 0457 12/02/19 0353 12/02/19 0353 12/03/19 1422 12/03/19 1818 12/04/19 0720 12/05/19 0638 12/06/19 0515  NA 132*   < > 139   < > 139 139 141 144 147*  K 4.4   < > 4.4   < > 5.0 4.9 5.1 4.8 4.3  CL 87*   < > 87*   < > 83* 86* 87* 89* 96*  CO2 29   < > 34*   < > 37* 36* 36* 37* 35*  GLUCOSE 183*   < > 200*   < > 196* 196* 173* 157* 193*  BUN 119*   < > 150*   < > 177* 171* 176* 170* 150*  CREATININE 2.52*   < > 2.91*   < > 2.99* 2.88* 3.00* 2.56* 2.39*  CALCIUM 10.1   < > 10.9*   < > 11.5* 11.6* 12.2* 11.5* 11.9*  MG 2.4   < > 2.7*   < > 3.0* 2.7* 3.0* 3.0* 2.8*  PHOS 7.1*  --  6.8*  --   --   --  6.7* 5.8* 5.0*   < > = values in this interval not displayed.    ABG: No results for input(s): PHART, PCO2ART, PO2ART, HCO3, O2SAT in the  last 168 hours.  Liver Function Tests: Recent Labs  Lab 11/30/19 0457 12/02/19 0353 12/04/19 0720 12/05/19 0947 12/06/19 0515  AST  --   --  22  --   --   ALT  --   --  25  --   --   ALKPHOS  --   --  81  --   --   BILITOT  --   --  0.9  --   --   PROT  --   --  7.5  --   --   ALBUMIN 2.9* 3.2* 3.6 3.3* 3.7   No results for input(s): LIPASE, AMYLASE in the last 168 hours. No results for input(s): AMMONIA in the last 168 hours.  CBC: Recent Labs  Lab 11/30/19 0457 12/02/19 0353 12/03/19 1818 12/04/19 0720 12/06/19 0515  WBC 9.7 10.3 10.5 12.1* 14.5*  HGB 8.7* 9.5* 9.8* 10.6* 10.8*  HCT 28.3* 31.6* 32.6* 35.2* 36.4  MCV 91.0 92.1 92.9 91.9 93.8  PLT 263 321 374 366 355    Cardiac Enzymes: No results for input(s):  CKTOTAL, CKMB, CKMBINDEX, TROPONINI in the last 168 hours.  BNP (last 3 results) No results for input(s): BNP in the last 8760 hours.  ProBNP (last 3 results) No results for input(s): PROBNP in the last 8760 hours.  Radiological Exams: No results found.  Assessment/Plan Active Problems:   Acute on chronic respiratory failure with hypoxia (HCC)   End stage renal disease on dialysis ([place])   COVID-19 virus infection   Pneumonia due to COVID-19 virus   1. Acute on chronic respiratory failure with hypoxia doing well with pressure support 2. Patient today should be able to start on the T collar trials. 3. In stage II renal failure on hemodialysis currently is off of dialysis 4. COVID-19 virus infection in recovery 5. Pneumonia due to COVID-19 treated   I have personally seen and evaluated the patient, evaluated laboratory and imaging results, formulated the assessment and plan and placed orders. The Patient requires high complexity decision making with multiple systems involvement.  Rounds were done with the Respiratory Therapy Director and Staff therapists and discussed with nursing staff also.  Allyne Gee, MD [facility] Pulmonary Critical Care  Medicine Sleep Medicine

## 2019-12-07 DIAGNOSIS — J9621 Acute and chronic respiratory failure with hypoxia: Secondary | ICD-10-CM | POA: Diagnosis not present

## 2019-12-07 DIAGNOSIS — Z9911 Dependence on respirator [ventilator] status: Secondary | ICD-10-CM | POA: Diagnosis not present

## 2019-12-07 DIAGNOSIS — U071 COVID-19: Secondary | ICD-10-CM | POA: Diagnosis not present

## 2019-12-07 DIAGNOSIS — N186 End stage renal disease: Secondary | ICD-10-CM | POA: Diagnosis not present

## 2019-12-07 LAB — RENAL FUNCTION PANEL
Albumin: 3.4 g/dL — ABNORMAL LOW (ref 3.5–5.0)
Anion gap: 12 (ref 5–15)
BUN: 129 mg/dL — ABNORMAL HIGH (ref 8–23)
CO2: 37 mmol/L — ABNORMAL HIGH (ref 22–32)
Calcium: 11.9 mg/dL — ABNORMAL HIGH (ref 8.9–10.3)
Chloride: 99 mmol/L (ref 98–111)
Creatinine, Ser: 2.17 mg/dL — ABNORMAL HIGH (ref 0.44–1.00)
GFR, Estimated: 24 mL/min — ABNORMAL LOW (ref >60)
Glucose, Bld: 170 mg/dL — ABNORMAL HIGH (ref 70–99)
Phosphorus: 4.9 mg/dL — ABNORMAL HIGH (ref 2.5–4.6)
Potassium: 3.4 mmol/L — ABNORMAL LOW (ref 3.5–5.1)
Sodium: 148 mmol/L — ABNORMAL HIGH (ref 135–145)

## 2019-12-07 NOTE — Progress Notes (Signed)
[facility]  ROUNDING NOTE   Subjective:  Patient appears to be doing better now. Switched from normal saline to D5W. Tube feeds were also switched to Nepro as she was having diarrhea.   Objective:  Vital signs in last 24 hours:  Temperature 97.4 pulse 104 respirations 31 blood pressure 166/74  Physical Exam: General:  No acute distress  Head:  Normocephalic, atraumatic. Moist oral mucosal membranes  Eyes:  Anicteric  Neck:  Tracheostomy in place  Lungs:   Scattered rhonchi, vent assisted  Heart:  S1S2 no rubs  Abdomen:   Soft, nontender, bowel sounds present  Extremities:  No peripheral edema.  Neurologic:  Awake, alert  Skin:  No acute rashes  Access:  No dialysis access    Basic Metabolic Panel: Recent Labs  Lab 12/02/19 0353 12/02/19 0353 12/03/19 1422 12/03/19 1422 12/03/19 1818 12/03/19 1818 12/04/19 0720 12/04/19 0720 12/05/19 0638 12/06/19 0515 12/07/19 0500  NA 139   < > 139   < > 139  --  141  --  144 147* 148*  K 4.4   < > 5.0   < > 4.9  --  5.1  --  4.8 4.3 3.4*  CL 87*   < > 83*   < > 86*  --  87*  --  89* 96* 99  CO2 34*   < > 37*   < > 36*  --  36*  --  37* 35* 37*  GLUCOSE 200*   < > 196*   < > 196*  --  173*  --  157* 193* 170*  BUN 150*   < > 177*   < > 171*  --  176*  --  170* 150* 129*  CREATININE 2.91*   < > 2.99*   < > 2.88*  --  3.00*  --  2.56* 2.39* 2.17*  CALCIUM 10.9*   < > 11.5*   < > 11.6*   < > 12.2*   < > 11.5* 11.9* 11.9*  MG 2.7*   < > 3.0*  --  2.7*  --  3.0*  --  3.0* 2.8*  --   PHOS 6.8*  --   --   --   --   --  6.7*  --  5.8* 5.0* 4.9*   < > = values in this interval not displayed.    Liver Function Tests: Recent Labs  Lab 12/02/19 0353 12/04/19 0720 12/05/19 8182 12/06/19 0515 12/07/19 0500  AST  --  22  --   --   --   ALT  --  25  --   --   --   ALKPHOS  --  81  --   --   --   BILITOT  --  0.9  --   --   --   PROT  --  7.5  --   --   --   ALBUMIN 3.2* 3.6 3.3* 3.7 3.4*   No results for input(s):  LIPASE, AMYLASE in the last 168 hours. No results for input(s): AMMONIA in the last 168 hours.  CBC: Recent Labs  Lab 12/02/19 0353 12/03/19 1818 12/04/19 0720 12/06/19 0515  WBC 10.3 10.5 12.1* 14.5*  HGB 9.5* 9.8* 10.6* 10.8*  HCT 31.6* 32.6* 35.2* 36.4  MCV 92.1 92.9 91.9 93.8  PLT 321 374 366 355    Cardiac Enzymes: No results for input(s): CKTOTAL, CKMB, CKMBINDEX, TROPONINI in the last 168 hours.  BNP: Invalid input(s): POCBNP  CBG: No results for input(s): GLUCAP in the last 168 hours.  Microbiology: Results for orders placed or performed during the hospital encounter of 11/05/19  Culture, respiratory (non-expectorated)     Status: None   Collection Time: 11/06/19  1:46 PM   Specimen: Tracheal Aspirate; Respiratory  Result Value Ref Range Status   Specimen Description TRACHEAL ASPIRATE  Final   Special Requests NONE  Final   Gram Stain   Final    RARE WBC PRESENT, PREDOMINANTLY PMN NO ORGANISMS SEEN    Culture   Final    RARE Normal respiratory flora-no Staph aureus or Pseudomonas seen Performed at [facility] [address] [place], [place] [address]    Report Status 11/08/2019 FINAL  Final  Urine Culture     Status: Abnormal   Collection Time: 11/10/19  1:36 PM   Specimen: Urine, Random  Result Value Ref Range Status   Specimen Description URINE, RANDOM  Final   Special Requests   Final    NONE Performed at [facility], [place] [address], [place], [place] [address]    Culture 60,000 COLONIES/mL YEAST (A)  Final   Report Status 11/11/2019 FINAL  Final  Culture, blood (routine x 2)     Status: None   Collection Time: 11/12/19 10:00 PM   Specimen: BLOOD  Result Value Ref Range Status   Specimen Description BLOOD SITE NOT SPECIFIED  Final   Special Requests   Final    BOTTLES DRAWN AEROBIC AND ANAEROBIC Blood Culture results may not be optimal due to an excessive volume of blood received in culture bottles   Culture   Final    NO GROWTH  5 DAYS Performed at [facility], [place] [address] [place], [place] [address]    Report Status 11/17/2019 FINAL  Final  Culture, blood (routine x 2)     Status: Abnormal   Collection Time: 11/12/19 10:06 PM   Specimen: BLOOD  Result Value Ref Range Status   Specimen Description BLOOD SITE NOT SPECIFIED  Final   Special Requests   Final    BOTTLES DRAWN AEROBIC AND ANAEROBIC Blood Culture results may not be optimal due to an excessive volume of blood received in culture bottles   Culture  Setup Time   Final    GRAM POSITIVE COCCI ANAEROBIC BOTTLE ONLY CRITICAL RESULT CALLED TO, READ BACK BY AND VERIFIED WITH: RN J ALGHALI @0058  10/14/19 BY S GEZAHEGN  Performed at [facility], [place] [address], [place], [place] [address]    Culture STAPHYLOCOCCUS EPIDERMIDIS (A)  Final   Report Status 11/16/2019 FINAL  Final   Organism ID, Bacteria STAPHYLOCOCCUS EPIDERMIDIS  Final      Susceptibility   Staphylococcus epidermidis - MIC*    CIPROFLOXACIN >=8 RESISTANT Resistant     ERYTHROMYCIN >=8 RESISTANT Resistant     GENTAMICIN 8 INTERMEDIATE Intermediate     OXACILLIN >=4 RESISTANT Resistant     TETRACYCLINE 2 SENSITIVE Sensitive     VANCOMYCIN 1 SENSITIVE Sensitive     TRIMETH/SULFA 80 RESISTANT Resistant     CLINDAMYCIN >=8 RESISTANT Resistant     RIFAMPIN <=0.5 SENSITIVE Sensitive     Inducible Clindamycin NEGATIVE Sensitive     * STAPHYLOCOCCUS EPIDERMIDIS  Blood Culture ID Panel (Reflexed)     Status: Abnormal   Collection Time: 11/12/19 10:06 PM  Result Value Ref Range Status   Enterococcus faecalis NOT DETECTED NOT DETECTED Final   Enterococcus Faecium NOT  DETECTED NOT DETECTED Final   Listeria monocytogenes NOT DETECTED NOT DETECTED Final   Staphylococcus species DETECTED (A) NOT DETECTED Final    Comment: CRITICAL RESULT CALLED TO, READ BACK BY AND VERIFIED WITH: RN J ALGHALI @0058  10/14/19 BY S GEZAHEGN     Staphylococcus aureus (BCID) NOT DETECTED NOT DETECTED Final    Staphylococcus epidermidis DETECTED (A) NOT DETECTED Final    Comment: Methicillin (oxacillin) resistant coagulase negative staphylococcus. Possible blood culture contaminant (unless isolated from more than one blood culture draw or clinical case suggests pathogenicity). No antibiotic treatment is indicated for blood  culture contaminants. CRITICAL RESULT CALLED TO, READ BACK BY AND VERIFIED WITH: RN J ALGHALI @0058  10/14/19 BY S GEZAHEGN     Staphylococcus lugdunensis NOT DETECTED NOT DETECTED Final   Streptococcus species NOT DETECTED NOT DETECTED Final   Streptococcus agalactiae NOT DETECTED NOT DETECTED Final   Streptococcus pneumoniae NOT DETECTED NOT DETECTED Final   Streptococcus pyogenes NOT DETECTED NOT DETECTED Final   A.calcoaceticus-baumannii NOT DETECTED NOT DETECTED Final   Bacteroides fragilis NOT DETECTED NOT DETECTED Final   Enterobacterales NOT DETECTED NOT DETECTED Final   Enterobacter cloacae complex NOT DETECTED NOT DETECTED Final   Escherichia coli NOT DETECTED NOT DETECTED Final   Klebsiella aerogenes NOT DETECTED NOT DETECTED Final   Klebsiella oxytoca NOT DETECTED NOT DETECTED Final   Klebsiella pneumoniae NOT DETECTED NOT DETECTED Final   Proteus species NOT DETECTED NOT DETECTED Final   Salmonella species NOT DETECTED NOT DETECTED Final   Serratia marcescens NOT DETECTED NOT DETECTED Final   Haemophilus influenzae NOT DETECTED NOT DETECTED Final   Neisseria meningitidis NOT DETECTED NOT DETECTED Final   Pseudomonas aeruginosa NOT DETECTED NOT DETECTED Final   Stenotrophomonas maltophilia NOT DETECTED NOT DETECTED Final   Candida albicans NOT DETECTED NOT DETECTED Final   Candida auris NOT DETECTED NOT DETECTED Final   Candida glabrata NOT DETECTED NOT DETECTED Final   Candida krusei NOT DETECTED NOT DETECTED Final   Candida parapsilosis NOT DETECTED NOT DETECTED Final   Candida tropicalis NOT DETECTED NOT DETECTED Final   Cryptococcus neoformans/gattii NOT  DETECTED NOT DETECTED Final   Methicillin resistance mecA/C DETECTED (A) NOT DETECTED Final    Comment: CRITICAL RESULT CALLED TO, READ BACK BY AND VERIFIED WITH: RN J ALGHALI @0058  10/14/19 BY S GEZAHEGN  Performed at [facility], [address], [place], [place] [address]   Culture, respiratory (non-expectorated)     Status: None   Collection Time: 11/12/19 10:23 PM   Specimen: Tracheal Aspirate; Respiratory  Result Value Ref Range Status   Specimen Description TRACHEAL ASPIRATE  Final   Special Requests NONE  Final   Gram Stain   Final    RARE WBC PRESENT,BOTH PMN AND MONONUCLEAR NO ORGANISMS SEEN Performed at [facility], [address], [place], [place] [address]    Culture FEW CANDIDA ALBICANS  Final   Report Status 11/15/2019 FINAL  Final  Culture, blood (routine x 2)     Status: None   Collection Time: 11/15/19  3:24 PM   Specimen: BLOOD RIGHT HAND  Result Value Ref Range Status   Specimen Description BLOOD RIGHT HAND  Final   Special Requests   Final    BOTTLES DRAWN AEROBIC AND ANAEROBIC Blood Culture adequate volume   Culture   Final    NO GROWTH 5 DAYS Performed at [facility], [place] [address], [place], [place] [address]    Report Status 11/20/2019 FINAL  Final  Culture, blood (routine x 2)     Status: None   Collection Time: 11/15/19  3:27 PM   Specimen: BLOOD LEFT HAND  Result Value Ref Range Status   Specimen Description BLOOD LEFT HAND  Final   Special Requests   Final    BOTTLES DRAWN AEROBIC AND ANAEROBIC Blood Culture adequate volume   Culture   Final    NO GROWTH 5 DAYS Performed at [facility], [address], [place], [place] [address]    Report Status 11/20/2019 FINAL  Final  Culture, Urine     Status: None   Collection Time: 11/20/19  4:05 PM   Specimen: Urine, Random  Result Value Ref Range Status   Specimen Description URINE, RANDOM  Final   Special Requests NONE  Final   Culture   Final    NO GROWTH Performed at  [facility], [place] [address], [place], [place] [address]    Report Status 11/21/2019 FINAL  Final  Culture, blood (routine x 2)     Status: None   Collection Time: 11/20/19  4:08 PM   Specimen: BLOOD  Result Value Ref Range Status   Specimen Description BLOOD SITE NOT SPECIFIED  Final   Special Requests   Final    BOTTLES DRAWN AEROBIC AND ANAEROBIC Blood Culture adequate volume   Culture   Final    NO GROWTH 5 DAYS Performed at [facility], [place] [address], [place], [place] [address]    Report Status 11/25/2019 FINAL  Final  Culture, blood (routine x 2)     Status: None   Collection Time: 11/20/19  4:15 PM   Specimen: BLOOD  Result Value Ref Range Status   Specimen Description BLOOD SITE NOT SPECIFIED  Final   Special Requests   Final    BOTTLES DRAWN AEROBIC AND ANAEROBIC Blood Culture results may not be optimal due to an excessive volume of blood received in culture bottles   Culture   Final    NO GROWTH 5 DAYS Performed at [facility], [place] [address] [place], [place] [address]    Report Status 11/25/2019 FINAL  Final  Culture, respiratory (non-expectorated)     Status: None   Collection Time: 11/20/19  6:43 PM   Specimen: Tracheal Aspirate; Respiratory  Result Value Ref Range Status   Specimen Description TRACHEAL ASPIRATE  Final   Special Requests NONE  Final   Gram Stain   Final    FEW WBC PRESENT,BOTH PMN AND MONONUCLEAR NO ORGANISMS SEEN Performed at [facility], [address], [place], [place] [address]    Culture RARE CANDIDA ALBICANS  Final   Report Status 11/24/2019 FINAL  Final  C Difficile Quick Screen (NO PCR Reflex)     Status: None   Collection Time: 12/06/19  4:35 PM   Specimen: STOOL  Result Value Ref Range Status   C Diff antigen NEGATIVE NEGATIVE Final   C Diff toxin NEGATIVE NEGATIVE Final   C Diff interpretation No C. difficile detected.  Final    Comment: Performed at [facility], [place] [address]  [place], [place] [address]    Coagulation Studies: No results for input(s): LABPROT, INR in the last 72 hours.  Urinalysis: No results for input(s): COLORURINE, LABSPEC, PHURINE, GLUCOSEU, HGBUR, BILIRUBINUR, KETONESUR, PROTEINUR, UROBILINOGEN, NITRITE, LEUKOCYTESUR in the last 72 hours.  Invalid input(s): APPERANCEUR    Imaging: No results found.   Medications:  Assessment/ Plan:  69 y.o. female with a PMHx of diabetes mellitus type 2, hyperlipidemia, plantar fasciitis, B12 deficiency, chronic low back pain, restless leg syndrome, GERD, nonalcoholic fatty liver disease, anxiety, nephrolithiasis, irritable bowel syndrome, hypertension, chronic kidney disease stage IIIa, who was admitted to [facility] on 11/05/2019 for ongoing treatment of post COVID-19 recovery.  1.  Acute kidney injury/chronic kidney disease stage IIIa.    BUN and creatinine now coming down with intensified hydration.  Currently on D5W.  Continue IV fluid hydration for now.  Monitor renal parameters.  No indication for dialysis.  Urine output was 2.3 L over the preceding 24 hours.  2.  Acute respiratory failure secondary to COVID-19 infection.    Patient was off the ventilator for 9 hours yesterday.  Weaning efforts to be continued today.  3.  Anemia of chronic kidney disease.    Hemoglobin now 10.8.  Continue to monitor.  4.  Secondary hyperparathyroidism.  Phosphorus down to 4.9 with increased hydration.  5.  Bacteremia.  1 or 2 culture shows Staph epidermidis.  Dialysis catheter was removed.    LOS: 0 Kynadi Dragos 11/24/20217:55 AM

## 2019-12-07 NOTE — Progress Notes (Signed)
Pulmonary Critical Care Medicine [place]   PULMONARY CRITICAL CARE SERVICE  PROGRESS NOTE  Date of Service: 12/07/2019  Jenavi Beedle  VVO:[id number]  DOB: 1950/05/14   DOA: 11/05/2019  Referring Physician: Merton Border, MD  HPI: Veronica Melton is a 69 y.o. female seen for follow up of Acute on Chronic Respiratory Failure.  Yesterday patient was able to do T collar wean for about 9.5 hours  Medications: Reviewed on Rounds  Physical Exam:  Vitals: Temperature is 97.4 pulse 104 respiratory rate 30 blood pressure is 156/74 saturations 93%  Ventilator Settings on pressure control FiO2 30% tidal volume 467  . General: Comfortable at this time . Eyes: Grossly normal lids, irises & conjunctiva . ENT: grossly tongue is normal . Neck: no obvious mass . Cardiovascular: S1 S2 normal no gallop . Respiratory: No rhonchi no rales . Abdomen: soft . Skin: no rash seen on limited exam . Musculoskeletal: not rigid . Psychiatric:unable to assess . Neurologic: no seizure no involuntary movements         Lab Data:   Basic Metabolic Panel: Recent Labs  Lab 12/02/19 0353 12/02/19 0353 12/03/19 1422 12/03/19 1422 12/03/19 1818 12/04/19 0720 12/05/19 0638 12/06/19 0515 12/07/19 0500  NA 139   < > 139   < > 139 141 144 147* 148*  K 4.4   < > 5.0   < > 4.9 5.1 4.8 4.3 3.4*  CL 87*   < > 83*   < > 86* 87* 89* 96* 99  CO2 34*   < > 37*   < > 36* 36* 37* 35* 37*  GLUCOSE 200*   < > 196*   < > 196* 173* 157* 193* 170*  BUN 150*   < > 177*   < > 171* 176* 170* 150* 129*  CREATININE 2.91*   < > 2.99*   < > 2.88* 3.00* 2.56* 2.39* 2.17*  CALCIUM 10.9*   < > 11.5*   < > 11.6* 12.2* 11.5* 11.9* 11.9*  MG 2.7*   < > 3.0*  --  2.7* 3.0* 3.0* 2.8*  --   PHOS 6.8*  --   --   --   --  6.7* 5.8* 5.0* 4.9*   < > = values in this interval not displayed.    ABG: No results for input(s): PHART, PCO2ART, PO2ART, HCO3, O2SAT in the last 168 hours.  Liver Function  Tests: Recent Labs  Lab 12/02/19 0353 12/04/19 0720 12/05/19 2694 12/06/19 0515 12/07/19 0500  AST  --  22  --   --   --   ALT  --  25  --   --   --   ALKPHOS  --  81  --   --   --   BILITOT  --  0.9  --   --   --   PROT  --  7.5  --   --   --   ALBUMIN 3.2* 3.6 3.3* 3.7 3.4*   No results for input(s): LIPASE, AMYLASE in the last 168 hours. No results for input(s): AMMONIA in the last 168 hours.  CBC: Recent Labs  Lab 12/02/19 0353 12/03/19 1818 12/04/19 0720 12/06/19 0515  WBC 10.3 10.5 12.1* 14.5*  HGB 9.5* 9.8* 10.6* 10.8*  HCT 31.6* 32.6* 35.2* 36.4  MCV 92.1 92.9 91.9 93.8  PLT 321 374 366 355    Cardiac Enzymes: No results for input(s): CKTOTAL, CKMB, CKMBINDEX, TROPONINI in the last 168 hours.  BNP (  last 3 results) No results for input(s): BNP in the last 8760 hours.  ProBNP (last 3 results) No results for input(s): PROBNP in the last 8760 hours.  Radiological Exams: No results found.  Assessment/Plan Active Problems:   Acute on chronic respiratory failure with hypoxia (HCC)   End stage renal disease on dialysis ([facility])   COVID-19 virus infection   Pneumonia due to COVID-19 virus   1. Acute on chronic respiratory failure hypoxia we will continue to wean on T collar as tolerated should try for 12 hours today 2. End-stage renal failure on hemodialysis we will continue with supportive care 3. COVID-19 virus infection recovery 4. Pneumonia due to COVID-19 treated continue to monitor   I have personally seen and evaluated the patient, evaluated laboratory and imaging results, formulated the assessment and plan and placed orders. The Patient requires high complexity decision making with multiple systems involvement.  Rounds were done with the Respiratory Therapy Director and Staff therapists and discussed with nursing staff also.  Allyne Gee, MD [facility] Pulmonary Critical Care Medicine Sleep Medicine

## 2019-12-07 NOTE — Progress Notes (Signed)
[facility]  ROUNDING NOTE   Subjective:  Patient with significant azotemia. Still vent dependent.   Objective:  Vital signs in last 24 hours:  Temperature 98.5 pulse 117 respirations 30 blood pressure 166/69  Physical Exam: General:  Critically ill-appearing  Head:  Normocephalic, atraumatic. Moist oral mucosal membranes  Eyes:  Anicteric  Neck:  Tracheostomy in place  Lungs:   Scattered rhonchi, vent assisted  Heart:  S1S2 no rubs  Abdomen:   Soft, nontender, bowel sounds present  Extremities:  Trace peripheral edema.  Neurologic:  Awake, alert  Skin:  No acute rashes  Access:  No dialysis access    Basic Metabolic Panel: Recent Labs  Lab 12/02/19 0353 12/02/19 0353 12/03/19 1422 12/03/19 1422 12/03/19 1818 12/03/19 1818 12/04/19 0720 12/04/19 0720 12/05/19 0638 12/06/19 0515 12/07/19 0500  NA 139   < > 139   < > 139  --  141  --  144 147* 148*  K 4.4   < > 5.0   < > 4.9  --  5.1  --  4.8 4.3 3.4*  CL 87*   < > 83*   < > 86*  --  87*  --  89* 96* 99  CO2 34*   < > 37*   < > 36*  --  36*  --  37* 35* 37*  GLUCOSE 200*   < > 196*   < > 196*  --  173*  --  157* 193* 170*  BUN 150*   < > 177*   < > 171*  --  176*  --  170* 150* 129*  CREATININE 2.91*   < > 2.99*   < > 2.88*  --  3.00*  --  2.56* 2.39* 2.17*  CALCIUM 10.9*   < > 11.5*   < > 11.6*   < > 12.2*   < > 11.5* 11.9* 11.9*  MG 2.7*   < > 3.0*  --  2.7*  --  3.0*  --  3.0* 2.8*  --   PHOS 6.8*  --   --   --   --   --  6.7*  --  5.8* 5.0* 4.9*   < > = values in this interval not displayed.    Liver Function Tests: Recent Labs  Lab 12/02/19 0353 12/04/19 0720 12/05/19 8469 12/06/19 0515 12/07/19 0500  AST  --  22  --   --   --   ALT  --  25  --   --   --   ALKPHOS  --  81  --   --   --   BILITOT  --  0.9  --   --   --   PROT  --  7.5  --   --   --   ALBUMIN 3.2* 3.6 3.3* 3.7 3.4*   No results for input(s): LIPASE, AMYLASE in the last 168 hours. No results for input(s): AMMONIA in  the last 168 hours.  CBC: Recent Labs  Lab 12/02/19 0353 12/03/19 1818 12/04/19 0720 12/06/19 0515  WBC 10.3 10.5 12.1* 14.5*  HGB 9.5* 9.8* 10.6* 10.8*  HCT 31.6* 32.6* 35.2* 36.4  MCV 92.1 92.9 91.9 93.8  PLT 321 374 366 355    Cardiac Enzymes: No results for input(s): CKTOTAL, CKMB, CKMBINDEX, TROPONINI in the last 168 hours.  BNP: Invalid input(s): POCBNP  CBG: No results for input(s): GLUCAP in the last 168 hours.  Microbiology: Results for orders placed or performed  during the hospital encounter of 11/05/19  Culture, respiratory (non-expectorated)     Status: None   Collection Time: 11/06/19  1:46 PM   Specimen: Tracheal Aspirate; Respiratory  Result Value Ref Range Status   Specimen Description TRACHEAL ASPIRATE  Final   Special Requests NONE  Final   Gram Stain   Final    RARE WBC PRESENT, PREDOMINANTLY PMN NO ORGANISMS SEEN    Culture   Final    RARE Normal respiratory flora-no Staph aureus or Pseudomonas seen Performed at [facility] [address], [place], [place] [address]    Report Status 11/08/2019 FINAL  Final  Urine Culture     Status: Abnormal   Collection Time: 11/10/19  1:36 PM   Specimen: Urine, Random  Result Value Ref Range Status   Specimen Description URINE, RANDOM  Final   Special Requests   Final    NONE Performed at [facility], [place] [address], [place], [place] [address]    Culture 60,000 COLONIES/mL YEAST (A)  Final   Report Status 11/11/2019 FINAL  Final  Culture, blood (routine x 2)     Status: None   Collection Time: 11/12/19 10:00 PM   Specimen: BLOOD  Result Value Ref Range Status   Specimen Description BLOOD SITE NOT SPECIFIED  Final   Special Requests   Final    BOTTLES DRAWN AEROBIC AND ANAEROBIC Blood Culture results may not be optimal due to an excessive volume of blood received in culture bottles   Culture   Final    NO GROWTH 5 DAYS Performed at [facility], [place] [address], [place],  [place] [address]    Report Status 11/17/2019 FINAL  Final  Culture, blood (routine x 2)     Status: Abnormal   Collection Time: 11/12/19 10:06 PM   Specimen: BLOOD  Result Value Ref Range Status   Specimen Description BLOOD SITE NOT SPECIFIED  Final   Special Requests   Final    BOTTLES DRAWN AEROBIC AND ANAEROBIC Blood Culture results may not be optimal due to an excessive volume of blood received in culture bottles   Culture  Setup Time   Final    GRAM POSITIVE COCCI ANAEROBIC BOTTLE ONLY CRITICAL RESULT CALLED TO, READ BACK BY AND VERIFIED WITH: RN J ALGHALI @0058  10/14/19 BY S GEZAHEGN  Performed at [facility], [place] [address], [place], [place] [address]    Culture STAPHYLOCOCCUS EPIDERMIDIS (A)  Final   Report Status 11/16/2019 FINAL  Final   Organism ID, Bacteria STAPHYLOCOCCUS EPIDERMIDIS  Final      Susceptibility   Staphylococcus epidermidis - MIC*    CIPROFLOXACIN >=8 RESISTANT Resistant     ERYTHROMYCIN >=8 RESISTANT Resistant     GENTAMICIN 8 INTERMEDIATE Intermediate     OXACILLIN >=4 RESISTANT Resistant     TETRACYCLINE 2 SENSITIVE Sensitive     VANCOMYCIN 1 SENSITIVE Sensitive     TRIMETH/SULFA 80 RESISTANT Resistant     CLINDAMYCIN >=8 RESISTANT Resistant     RIFAMPIN <=0.5 SENSITIVE Sensitive     Inducible Clindamycin NEGATIVE Sensitive     * STAPHYLOCOCCUS EPIDERMIDIS  Blood Culture ID Panel (Reflexed)     Status: Abnormal   Collection Time: 11/12/19 10:06 PM  Result Value Ref Range Status   Enterococcus faecalis NOT DETECTED NOT DETECTED Final   Enterococcus Faecium NOT DETECTED NOT DETECTED Final   Listeria monocytogenes NOT DETECTED NOT DETECTED Final   Staphylococcus species DETECTED (A)  NOT DETECTED Final    Comment: CRITICAL RESULT CALLED TO, READ BACK BY AND VERIFIED WITH: RN J ALGHALI @0058  10/14/19 BY S GEZAHEGN     Staphylococcus aureus (BCID) NOT DETECTED NOT DETECTED Final   Staphylococcus epidermidis DETECTED (A) NOT DETECTED Final    Comment:  Methicillin (oxacillin) resistant coagulase negative staphylococcus. Possible blood culture contaminant (unless isolated from more than one blood culture draw or clinical case suggests pathogenicity). No antibiotic treatment is indicated for blood  culture contaminants. CRITICAL RESULT CALLED TO, READ BACK BY AND VERIFIED WITH: RN J ALGHALI @0058  10/14/19 BY S GEZAHEGN     Staphylococcus lugdunensis NOT DETECTED NOT DETECTED Final   Streptococcus species NOT DETECTED NOT DETECTED Final   Streptococcus agalactiae NOT DETECTED NOT DETECTED Final   Streptococcus pneumoniae NOT DETECTED NOT DETECTED Final   Streptococcus pyogenes NOT DETECTED NOT DETECTED Final   A.calcoaceticus-baumannii NOT DETECTED NOT DETECTED Final   Bacteroides fragilis NOT DETECTED NOT DETECTED Final   Enterobacterales NOT DETECTED NOT DETECTED Final   Enterobacter cloacae complex NOT DETECTED NOT DETECTED Final   Escherichia coli NOT DETECTED NOT DETECTED Final   Klebsiella aerogenes NOT DETECTED NOT DETECTED Final   Klebsiella oxytoca NOT DETECTED NOT DETECTED Final   Klebsiella pneumoniae NOT DETECTED NOT DETECTED Final   Proteus species NOT DETECTED NOT DETECTED Final   Salmonella species NOT DETECTED NOT DETECTED Final   Serratia marcescens NOT DETECTED NOT DETECTED Final   Haemophilus influenzae NOT DETECTED NOT DETECTED Final   Neisseria meningitidis NOT DETECTED NOT DETECTED Final   Pseudomonas aeruginosa NOT DETECTED NOT DETECTED Final   Stenotrophomonas maltophilia NOT DETECTED NOT DETECTED Final   Candida albicans NOT DETECTED NOT DETECTED Final   Candida auris NOT DETECTED NOT DETECTED Final   Candida glabrata NOT DETECTED NOT DETECTED Final   Candida krusei NOT DETECTED NOT DETECTED Final   Candida parapsilosis NOT DETECTED NOT DETECTED Final   Candida tropicalis NOT DETECTED NOT DETECTED Final   Cryptococcus neoformans/gattii NOT DETECTED NOT DETECTED Final   Methicillin resistance mecA/C DETECTED (A)  NOT DETECTED Final    Comment: CRITICAL RESULT CALLED TO, READ BACK BY AND VERIFIED WITH: RN J ALGHALI @0058  10/14/19 BY S GEZAHEGN  Performed at [facility], [address], [place], [place] [address]   Culture, respiratory (non-expectorated)     Status: None   Collection Time: 11/12/19 10:23 PM   Specimen: Tracheal Aspirate; Respiratory  Result Value Ref Range Status   Specimen Description TRACHEAL ASPIRATE  Final   Special Requests NONE  Final   Gram Stain   Final    RARE WBC PRESENT,BOTH PMN AND MONONUCLEAR NO ORGANISMS SEEN Performed at [facility], [address] [place], [place] [address]    Culture FEW CANDIDA ALBICANS  Final   Report Status 11/15/2019 FINAL  Final  Culture, blood (routine x 2)     Status: None   Collection Time: 11/15/19  3:24 PM   Specimen: BLOOD RIGHT HAND  Result Value Ref Range Status   Specimen Description BLOOD RIGHT HAND  Final   Special Requests   Final    BOTTLES DRAWN AEROBIC AND ANAEROBIC Blood Culture adequate volume   Culture   Final    NO GROWTH 5 DAYS Performed at [facility], [place] [address], [place], [place] [address]    Report Status 11/20/2019 FINAL  Final  Culture, blood (routine x 2)     Status: None   Collection Time: 11/15/19  3:27 PM   Specimen: BLOOD LEFT HAND  Result Value Ref Range Status   Specimen Description BLOOD LEFT HAND  Final   Special Requests   Final    BOTTLES DRAWN AEROBIC AND ANAEROBIC Blood Culture adequate volume   Culture   Final    NO GROWTH 5 DAYS Performed at [facility], [address], [place], [place] [address]    Report Status 11/20/2019 FINAL  Final  Culture, Urine     Status: None   Collection Time: 11/20/19  4:05 PM   Specimen: Urine, Random  Result Value Ref Range Status   Specimen Description URINE, RANDOM  Final   Special Requests NONE  Final   Culture   Final    NO GROWTH Performed at [facility], [place] [address], [place], [place] [address]    Report  Status 11/21/2019 FINAL  Final  Culture, blood (routine x 2)     Status: None   Collection Time: 11/20/19  4:08 PM   Specimen: BLOOD  Result Value Ref Range Status   Specimen Description BLOOD SITE NOT SPECIFIED  Final   Special Requests   Final    BOTTLES DRAWN AEROBIC AND ANAEROBIC Blood Culture adequate volume   Culture   Final    NO GROWTH 5 DAYS Performed at [facility], [place] [address] [place], [place] [address]    Report Status 11/25/2019 FINAL  Final  Culture, blood (routine x 2)     Status: None   Collection Time: 11/20/19  4:15 PM   Specimen: BLOOD  Result Value Ref Range Status   Specimen Description BLOOD SITE NOT SPECIFIED  Final   Special Requests   Final    BOTTLES DRAWN AEROBIC AND ANAEROBIC Blood Culture results may not be optimal due to an excessive volume of blood received in culture bottles   Culture   Final    NO GROWTH 5 DAYS Performed at [facility], [place] [address], [place], [place] [address]    Report Status 11/25/2019 FINAL  Final  Culture, respiratory (non-expectorated)     Status: None   Collection Time: 11/20/19  6:43 PM   Specimen: Tracheal Aspirate; Respiratory  Result Value Ref Range Status   Specimen Description TRACHEAL ASPIRATE  Final   Special Requests NONE  Final   Gram Stain   Final    FEW WBC PRESENT,BOTH PMN AND MONONUCLEAR NO ORGANISMS SEEN Performed at [facility], [address] [place], [place] [address]    Culture RARE CANDIDA ALBICANS  Final   Report Status 11/24/2019 FINAL  Final  C Difficile Quick Screen (NO PCR Reflex)     Status: None   Collection Time: 12/06/19  4:35 PM   Specimen: STOOL  Result Value Ref Range Status   C Diff antigen NEGATIVE NEGATIVE Final   C Diff toxin NEGATIVE NEGATIVE Final   C Diff interpretation No C. difficile detected.  Final    Comment: Performed at [facility], [place] [address] [place], [place] [address]    Coagulation Studies: No results for input(s): LABPROT,  INR in the last 72 hours.  Urinalysis: No results for input(s): COLORURINE, LABSPEC, PHURINE, GLUCOSEU, HGBUR, BILIRUBINUR, KETONESUR, PROTEINUR, UROBILINOGEN, NITRITE, LEUKOCYTESUR in the last 72 hours.  Invalid input(s): APPERANCEUR    Imaging: No results found.   Medications:       Assessment/ Plan:  69 y.o. female with a PMHx of diabetes mellitus type 2, hyperlipidemia,  plantar fasciitis, B12 deficiency, chronic low back pain, restless leg syndrome, GERD, nonalcoholic fatty liver disease, anxiety, nephrolithiasis, irritable bowel syndrome, hypertension, chronic kidney disease stage IIIa, who was admitted to [facility] on 11/05/2019 for ongoing treatment of post COVID-19 recovery.  1.  Acute kidney injury/chronic kidney disease stage IIIa.    BUN and creatinine higher over the weekend at 176 and 3.0 respectively.  Recommend holding diuretics.  Provide IV fluid hydration with 0.9 normal saline at 50 cc/h for at least 48 hours.  2.  Acute respiratory failure secondary to COVID-19 infection.    Still requiring vent support.  Difficult to wean from the ventilator.  3.  Anemia of chronic kidney disease.    Hemoglobin up to 10.6.  Continue to monitor.  4.  Secondary hyperparathyroidism.  Phosphorus high at 6.7.  Likely due to acute renal failure.  5.  Bacteremia.  1 or 2 culture shows Staph epidermidis.  Dialysis catheter was removed.    LOS: 0 Yahmir Sokolov 11/24/20217:50 AM

## 2019-12-08 DIAGNOSIS — N186 End stage renal disease: Secondary | ICD-10-CM | POA: Diagnosis not present

## 2019-12-08 DIAGNOSIS — J9621 Acute and chronic respiratory failure with hypoxia: Secondary | ICD-10-CM | POA: Diagnosis not present

## 2019-12-08 DIAGNOSIS — U071 COVID-19: Secondary | ICD-10-CM | POA: Diagnosis not present

## 2019-12-08 DIAGNOSIS — Z9911 Dependence on respirator [ventilator] status: Secondary | ICD-10-CM | POA: Diagnosis not present

## 2019-12-08 LAB — RENAL FUNCTION PANEL
Albumin: 3.3 g/dL — ABNORMAL LOW (ref 3.5–5.0)
Anion gap: 13 (ref 5–15)
BUN: 120 mg/dL — ABNORMAL HIGH (ref 8–23)
CO2: 36 mmol/L — ABNORMAL HIGH (ref 22–32)
Calcium: 12 mg/dL — ABNORMAL HIGH (ref 8.9–10.3)
Chloride: 97 mmol/L — ABNORMAL LOW (ref 98–111)
Creatinine, Ser: 1.76 mg/dL — ABNORMAL HIGH (ref 0.44–1.00)
GFR, Estimated: 31 mL/min — ABNORMAL LOW (ref >60)
Glucose, Bld: 178 mg/dL — ABNORMAL HIGH (ref 70–99)
Phosphorus: 5 mg/dL — ABNORMAL HIGH (ref 2.5–4.6)
Potassium: 3.8 mmol/L (ref 3.5–5.1)
Sodium: 146 mmol/L — ABNORMAL HIGH (ref 135–145)

## 2019-12-08 LAB — CBC
HCT: 34.5 % — ABNORMAL LOW (ref 36.0–46.0)
Hemoglobin: 10.2 g/dL — ABNORMAL LOW (ref 12.0–15.0)
MCH: 29.1 pg (ref 26.0–34.0)
MCHC: 29.6 g/dL — ABNORMAL LOW (ref 30.0–36.0)
MCV: 98.3 fL (ref 80.0–100.0)
Platelets: 299 10*3/uL (ref 150–400)
RBC: 3.51 MIL/uL — ABNORMAL LOW (ref 3.87–5.11)
RDW: 22.9 % — ABNORMAL HIGH (ref 11.5–15.5)
WBC: 11.1 10*3/uL — ABNORMAL HIGH (ref 4.0–10.5)
nRBC: 0.3 % — ABNORMAL HIGH (ref 0.0–0.2)

## 2019-12-08 LAB — MAGNESIUM: Magnesium: 2.3 mg/dL (ref 1.7–2.4)

## 2019-12-08 NOTE — Progress Notes (Signed)
Pulmonary Critical Care Medicine [place]   PULMONARY CRITICAL CARE SERVICE  PROGRESS NOTE  Date of Service: 12/08/2019  Veronica Melton  FGH:[id number]  DOB: 1950/03/11   DOA: 11/05/2019  Referring Physician: Merton Border, MD  HPI: Veronica Melton is a 69 y.o. female seen for follow up of Acute on Chronic Respiratory Failure.  Patient is on T collar currently on 40% FiO2 with a 20-hour goal  Medications: Reviewed on Rounds  Physical Exam:  Vitals: Temperature is 98.6 pulse 97 respiratory 26 blood pressure is 131/75 saturations 97%  Ventilator Settings on T collar 40% FiO2   General: Comfortable at this time  Eyes: Grossly normal lids, irises & conjunctiva  ENT: grossly tongue is normal  Neck: no obvious mass  Cardiovascular: S1 S2 normal no gallop  Respiratory: No rhonchi no rales  Abdomen: soft  Skin: no rash seen on limited exam  Musculoskeletal: not rigid  Psychiatric:unable to assess  Neurologic: no seizure no involuntary movements         Lab Data:   Basic Metabolic Panel: Recent Labs  Lab 12/03/19 1818 12/03/19 1818 12/04/19 0720 12/05/19 0638 12/06/19 0515 12/07/19 0500 12/08/19 0326  NA 139   < > 141 144 147* 148* 146*  K 4.9   < > 5.1 4.8 4.3 3.4* 3.8  CL 86*   < > 87* 89* 96* 99 97*  CO2 36*   < > 36* 37* 35* 37* 36*  GLUCOSE 196*   < > 173* 157* 193* 170* 178*  BUN 171*   < > 176* 170* 150* 129* 120*  CREATININE 2.88*   < > 3.00* 2.56* 2.39* 2.17* 1.76*  CALCIUM 11.6*   < > 12.2* 11.5* 11.9* 11.9* 12.0*  MG 2.7*  --  3.0* 3.0* 2.8*  --  2.3  PHOS  --   --  6.7* 5.8* 5.0* 4.9* 5.0*   < > = values in this interval not displayed.    ABG: No results for input(s): PHART, PCO2ART, PO2ART, HCO3, O2SAT in the last 168 hours.  Liver Function Tests: Recent Labs  Lab 12/04/19 0720 12/05/19 6789 12/06/19 0515 12/07/19 0500 12/08/19 0326  AST 22  --   --   --   --   ALT 25  --   --   --   --   ALKPHOS 81  --   --    --   --   BILITOT 0.9  --   --   --   --   PROT 7.5  --   --   --   --   ALBUMIN 3.6 3.3* 3.7 3.4* 3.3*   No results for input(s): LIPASE, AMYLASE in the last 168 hours. No results for input(s): AMMONIA in the last 168 hours.  CBC: Recent Labs  Lab 12/02/19 0353 12/03/19 1818 12/04/19 0720 12/06/19 0515 12/08/19 0326  WBC 10.3 10.5 12.1* 14.5* 11.1*  HGB 9.5* 9.8* 10.6* 10.8* 10.2*  HCT 31.6* 32.6* 35.2* 36.4 34.5*  MCV 92.1 92.9 91.9 93.8 98.3  PLT 321 374 366 355 299    Cardiac Enzymes: No results for input(s): CKTOTAL, CKMB, CKMBINDEX, TROPONINI in the last 168 hours.  BNP (last 3 results) No results for input(s): BNP in the last 8760 hours.  ProBNP (last 3 results) No results for input(s): PROBNP in the last 8760 hours.  Radiological Exams: No results found.  Assessment/Plan Active Problems:   Acute on chronic respiratory failure with hypoxia (HCC)   End stage  renal disease on dialysis ([place])   COVID-19 virus infection   Pneumonia due to COVID-19 virus   1. Acute on chronic respiratory failure hypoxia plan is to continue with on T collar patient goal today is 20 hours 2. End-stage renal failure on hemodialysis we will continue with present management 3. COVID-19 virus infection recovery 4. Pneumonia due to COVID-19 treated   I have personally seen and evaluated the patient, evaluated laboratory and imaging results, formulated the assessment and plan and placed orders. The Patient requires high complexity decision making with multiple systems involvement.  Rounds were done with the Respiratory Therapy Director and Staff therapists and discussed with nursing staff also.  Allyne Gee, MD [facility] Pulmonary Critical Care Medicine Sleep Medicine

## 2019-12-09 ENCOUNTER — Other Ambulatory Visit (HOSPITAL_COMMUNITY): Payer: Medicare Other

## 2019-12-09 DIAGNOSIS — N186 End stage renal disease: Secondary | ICD-10-CM | POA: Diagnosis not present

## 2019-12-09 DIAGNOSIS — J9621 Acute and chronic respiratory failure with hypoxia: Secondary | ICD-10-CM | POA: Diagnosis not present

## 2019-12-09 DIAGNOSIS — U071 COVID-19: Secondary | ICD-10-CM | POA: Diagnosis not present

## 2019-12-09 DIAGNOSIS — Z9911 Dependence on respirator [ventilator] status: Secondary | ICD-10-CM | POA: Diagnosis not present

## 2019-12-09 LAB — RENAL FUNCTION PANEL
Albumin: 3 g/dL — ABNORMAL LOW (ref 3.5–5.0)
Anion gap: 14 (ref 5–15)
BUN: 108 mg/dL — ABNORMAL HIGH (ref 8–23)
CO2: 33 mmol/L — ABNORMAL HIGH (ref 22–32)
Calcium: 11 mg/dL — ABNORMAL HIGH (ref 8.9–10.3)
Chloride: 93 mmol/L — ABNORMAL LOW (ref 98–111)
Creatinine, Ser: 1.59 mg/dL — ABNORMAL HIGH (ref 0.44–1.00)
GFR, Estimated: 35 mL/min — ABNORMAL LOW (ref >60)
Glucose, Bld: 151 mg/dL — ABNORMAL HIGH (ref 70–99)
Phosphorus: 5.6 mg/dL — ABNORMAL HIGH (ref 2.5–4.6)
Potassium: 3.5 mmol/L (ref 3.5–5.1)
Sodium: 140 mmol/L (ref 135–145)

## 2019-12-09 LAB — MAGNESIUM: Magnesium: 1.7 mg/dL (ref 1.7–2.4)

## 2019-12-09 LAB — TRIGLYCERIDES: Triglycerides: 561 mg/dL — ABNORMAL HIGH (ref <150)

## 2019-12-09 NOTE — Progress Notes (Signed)
Pulmonary Critical Care Medicine [place]   PULMONARY CRITICAL CARE SERVICE  PROGRESS NOTE  Date of Service: 12/09/2019  Veronica Melton  ZOX:[id number]  DOB: 19-Nov-1950   DOA: 11/05/2019  Referring Physician: Merton Border, MD  HPI: Veronica Melton is a 69 y.o. female seen for follow up of Acute on Chronic Respiratory Failure.  She is doing well with on T collar today will be hopefully 24 hours completed  Medications: Reviewed on Rounds  Physical Exam:  Vitals: Temperature is 98.5 pulse 97 respiratory rate 27 blood pressure is 130/63 saturations are 98%  Ventilator Settings on T collar with 40% FiO2  . General: Comfortable at this time . Eyes: Grossly normal lids, irises & conjunctiva . ENT: grossly tongue is normal . Neck: no obvious mass . Cardiovascular: S1 S2 normal no gallop . Respiratory: No rhonchi no rales noted at this time . Abdomen: soft . Skin: no rash seen on limited exam . Musculoskeletal: not rigid . Psychiatric:unable to assess . Neurologic: no seizure no involuntary movements         Lab Data:   Basic Metabolic Panel: Recent Labs  Lab 12/04/19 0720 12/04/19 0720 12/05/19 8119 12/06/19 0515 12/07/19 0500 12/08/19 0326 12/09/19 0430  NA 141   < > 144 147* 148* 146* 140  K 5.1   < > 4.8 4.3 3.4* 3.8 3.5  CL 87*   < > 89* 96* 99 97* 93*  CO2 36*   < > 37* 35* 37* 36* 33*  GLUCOSE 173*   < > 157* 193* 170* 178* 151*  BUN 176*   < > 170* 150* 129* 120* 108*  CREATININE 3.00*   < > 2.56* 2.39* 2.17* 1.76* 1.59*  CALCIUM 12.2*   < > 11.5* 11.9* 11.9* 12.0* 11.0*  MG 3.0*  --  3.0* 2.8*  --  2.3 1.7  PHOS 6.7*   < > 5.8* 5.0* 4.9* 5.0* 5.6*   < > = values in this interval not displayed.    ABG: No results for input(s): PHART, PCO2ART, PO2ART, HCO3, O2SAT in the last 168 hours.  Liver Function Tests: Recent Labs  Lab 12/04/19 0720 12/04/19 0720 12/05/19 1478 12/06/19 0515 12/07/19 0500 12/08/19 0326 12/09/19 0430  AST  22  --   --   --   --   --   --   ALT 25  --   --   --   --   --   --   ALKPHOS 81  --   --   --   --   --   --   BILITOT 0.9  --   --   --   --   --   --   PROT 7.5  --   --   --   --   --   --   ALBUMIN 3.6   < > 3.3* 3.7 3.4* 3.3* 3.0*   < > = values in this interval not displayed.   No results for input(s): LIPASE, AMYLASE in the last 168 hours. No results for input(s): AMMONIA in the last 168 hours.  CBC: Recent Labs  Lab 12/03/19 1818 12/04/19 0720 12/06/19 0515 12/08/19 0326  WBC 10.5 12.1* 14.5* 11.1*  HGB 9.8* 10.6* 10.8* 10.2*  HCT 32.6* 35.2* 36.4 34.5*  MCV 92.9 91.9 93.8 98.3  PLT 374 366 355 299    Cardiac Enzymes: No results for input(s): CKTOTAL, CKMB, CKMBINDEX, TROPONINI in the last 168 hours.  BNP (last  3 results) No results for input(s): BNP in the last 8760 hours.  ProBNP (last 3 results) No results for input(s): PROBNP in the last 8760 hours.  Radiological Exams: DG CHEST PORT 1 VIEW  Result Date: 12/09/2019 CLINICAL DATA:  Respiratory failure. EXAM: PORTABLE CHEST 1 VIEW COMPARISON:  11/28/2019. FINDINGS: Tracheostomy in stable position. Right IJ line noted with tip over the right atrium in unchanged position. Heart size stable. Diffuse bilateral pulmonary infiltrates/edema again noted without interim change. No pleural effusion or pneumothorax. IMPRESSION: 1. Tracheostomy tube in stable position. Right IJ line in unchanged position with tip over the right atrium. 2. Diffuse bilateral pulmonary infiltrates/edema again noted without interim change. Electronically Signed   By: Marcello Moores  Register   On: 12/09/2019 07:10    Assessment/Plan Active Problems:   Acute on chronic respiratory failure with hypoxia (HCC)   End stage renal disease on dialysis ([place])   COVID-19 virus infection   Pneumonia due to COVID-19 virus   1. Acute on chronic respiratory failure hypoxia we will continue with T-piece 24-hour goal 2. End-stage renal failure being followed by  nephrology for dialysis 3. COVID-19 virus infection in recovery 4. Pneumonia due to COVID-19 treated we will continue to follow   I have personally seen and evaluated the patient, evaluated laboratory and imaging results, formulated the assessment and plan and placed orders. The Patient requires high complexity decision making with multiple systems involvement.  Rounds were done with the Respiratory Therapy Director and Staff therapists and discussed with nursing staff also.  Allyne Gee, MD [facility] Pulmonary Critical Care Medicine Sleep Medicine

## 2019-12-09 NOTE — Progress Notes (Signed)
[facility]  ROUNDING NOTE   Subjective:  Renal parameters continue to improve. BUN down to 108 with a creatinine of 1.59. Urine output was 2.1 L over the preceding 24 hours.   Objective:  Vital signs in last 24 hours:  Temperature 98.5 pulse 97 respirations 27 blood pressure 130/63  Physical Exam: General:  No acute distress  Head:  Normocephalic, atraumatic. Moist oral mucosal membranes  Eyes:  Anicteric  Neck:  Tracheostomy in place  Lungs:   Scattered rhonchi, vent assisted  Heart:  S1S2 no rubs  Abdomen:   Soft, nontender, bowel sounds present  Extremities:  No peripheral edema.  Neurologic:  Awake, alert  Skin:  No acute rashes  Access:  No dialysis access    Basic Metabolic Panel: Recent Labs  Lab 12/04/19 0720 12/04/19 0720 12/05/19 2683 12/05/19 4196 12/06/19 0515 12/06/19 0515 12/07/19 0500 12/08/19 0326 12/09/19 0430  NA 141   < > 144  --  147*  --  148* 146* 140  K 5.1   < > 4.8  --  4.3  --  3.4* 3.8 3.5  CL 87*   < > 89*  --  96*  --  99 97* 93*  CO2 36*   < > 37*  --  35*  --  37* 36* 33*  GLUCOSE 173*   < > 157*  --  193*  --  170* 178* 151*  BUN 176*   < > 170*  --  150*  --  129* 120* 108*  CREATININE 3.00*   < > 2.56*  --  2.39*  --  2.17* 1.76* 1.59*  CALCIUM 12.2*   < > 11.5*   < > 11.9*   < > 11.9* 12.0* 11.0*  MG 3.0*  --  3.0*  --  2.8*  --   --  2.3 1.7  PHOS 6.7*   < > 5.8*  --  5.0*  --  4.9* 5.0* 5.6*   < > = values in this interval not displayed.    Liver Function Tests: Recent Labs  Lab 12/04/19 0720 12/04/19 0720 12/05/19 2229 12/06/19 0515 12/07/19 0500 12/08/19 0326 12/09/19 0430  AST 22  --   --   --   --   --   --   ALT 25  --   --   --   --   --   --   ALKPHOS 81  --   --   --   --   --   --   BILITOT 0.9  --   --   --   --   --   --   PROT 7.5  --   --   --   --   --   --   ALBUMIN 3.6   < > 3.3* 3.7 3.4* 3.3* 3.0*   < > = values in this interval not displayed.   No results for input(s): LIPASE,  AMYLASE in the last 168 hours. No results for input(s): AMMONIA in the last 168 hours.  CBC: Recent Labs  Lab 12/03/19 1818 12/04/19 0720 12/06/19 0515 12/08/19 0326  WBC 10.5 12.1* 14.5* 11.1*  HGB 9.8* 10.6* 10.8* 10.2*  HCT 32.6* 35.2* 36.4 34.5*  MCV 92.9 91.9 93.8 98.3  PLT 374 366 355 299    Cardiac Enzymes: No results for input(s): CKTOTAL, CKMB, CKMBINDEX, TROPONINI in the last 168 hours.  BNP: Invalid input(s): POCBNP  CBG: No results for input(s): GLUCAP in  the last 168 hours.  Microbiology: Results for orders placed or performed during the hospital encounter of 11/05/19  Culture, respiratory (non-expectorated)     Status: None   Collection Time: 11/06/19  1:46 PM   Specimen: Tracheal Aspirate; Respiratory  Result Value Ref Range Status   Specimen Description TRACHEAL ASPIRATE  Final   Special Requests NONE  Final   Gram Stain   Final    RARE WBC PRESENT, PREDOMINANTLY PMN NO ORGANISMS SEEN    Culture   Final    RARE Normal respiratory flora-no Staph aureus or Pseudomonas seen Performed at [facility] [address], [place], [place] [address]    Report Status 11/08/2019 FINAL  Final  Urine Culture     Status: Abnormal   Collection Time: 11/10/19  1:36 PM   Specimen: Urine, Random  Result Value Ref Range Status   Specimen Description URINE, RANDOM  Final   Special Requests   Final    NONE Performed at [facility], [place] [address], [place], [place] [address]    Culture 60,000 COLONIES/mL YEAST (A)  Final   Report Status 11/11/2019 FINAL  Final  Culture, blood (routine x 2)     Status: None   Collection Time: 11/12/19 10:00 PM   Specimen: BLOOD  Result Value Ref Range Status   Specimen Description BLOOD SITE NOT SPECIFIED  Final   Special Requests   Final    BOTTLES DRAWN AEROBIC AND ANAEROBIC Blood Culture results may not be optimal due to an excessive volume of blood received in culture bottles   Culture   Final    NO GROWTH 5  DAYS Performed at [facility], [place] [address] [place], [place] [address]    Report Status 11/17/2019 FINAL  Final  Culture, blood (routine x 2)     Status: Abnormal   Collection Time: 11/12/19 10:06 PM   Specimen: BLOOD  Result Value Ref Range Status   Specimen Description BLOOD SITE NOT SPECIFIED  Final   Special Requests   Final    BOTTLES DRAWN AEROBIC AND ANAEROBIC Blood Culture results may not be optimal due to an excessive volume of blood received in culture bottles   Culture  Setup Time   Final    GRAM POSITIVE COCCI ANAEROBIC BOTTLE ONLY CRITICAL RESULT CALLED TO, READ BACK BY AND VERIFIED WITH: RN J ALGHALI @0058  10/14/19 BY S GEZAHEGN  Performed at [facility], [place] [address] [place], [place] [address]    Culture STAPHYLOCOCCUS EPIDERMIDIS (A)  Final   Report Status 11/16/2019 FINAL  Final   Organism ID, Bacteria STAPHYLOCOCCUS EPIDERMIDIS  Final      Susceptibility   Staphylococcus epidermidis - MIC*    CIPROFLOXACIN >=8 RESISTANT Resistant     ERYTHROMYCIN >=8 RESISTANT Resistant     GENTAMICIN 8 INTERMEDIATE Intermediate     OXACILLIN >=4 RESISTANT Resistant     TETRACYCLINE 2 SENSITIVE Sensitive     VANCOMYCIN 1 SENSITIVE Sensitive     TRIMETH/SULFA 80 RESISTANT Resistant     CLINDAMYCIN >=8 RESISTANT Resistant     RIFAMPIN <=0.5 SENSITIVE Sensitive     Inducible Clindamycin NEGATIVE Sensitive     * STAPHYLOCOCCUS EPIDERMIDIS  Blood Culture ID Panel (Reflexed)     Status: Abnormal   Collection Time: 11/12/19 10:06 PM  Result Value Ref Range Status   Enterococcus faecalis NOT DETECTED NOT DETECTED Final   Enterococcus Faecium NOT DETECTED NOT DETECTED Final   Listeria  monocytogenes NOT DETECTED NOT DETECTED Final   Staphylococcus species DETECTED (A) NOT DETECTED Final    Comment: CRITICAL RESULT CALLED TO, READ BACK BY AND VERIFIED WITH: RN J ALGHALI @0058  10/14/19 BY S GEZAHEGN     Staphylococcus aureus (BCID) NOT DETECTED NOT DETECTED Final    Staphylococcus epidermidis DETECTED (A) NOT DETECTED Final    Comment: Methicillin (oxacillin) resistant coagulase negative staphylococcus. Possible blood culture contaminant (unless isolated from more than one blood culture draw or clinical case suggests pathogenicity). No antibiotic treatment is indicated for blood  culture contaminants. CRITICAL RESULT CALLED TO, READ BACK BY AND VERIFIED WITH: RN J ALGHALI @0058  10/14/19 BY S GEZAHEGN     Staphylococcus lugdunensis NOT DETECTED NOT DETECTED Final   Streptococcus species NOT DETECTED NOT DETECTED Final   Streptococcus agalactiae NOT DETECTED NOT DETECTED Final   Streptococcus pneumoniae NOT DETECTED NOT DETECTED Final   Streptococcus pyogenes NOT DETECTED NOT DETECTED Final   A.calcoaceticus-baumannii NOT DETECTED NOT DETECTED Final   Bacteroides fragilis NOT DETECTED NOT DETECTED Final   Enterobacterales NOT DETECTED NOT DETECTED Final   Enterobacter cloacae complex NOT DETECTED NOT DETECTED Final   Escherichia coli NOT DETECTED NOT DETECTED Final   Klebsiella aerogenes NOT DETECTED NOT DETECTED Final   Klebsiella oxytoca NOT DETECTED NOT DETECTED Final   Klebsiella pneumoniae NOT DETECTED NOT DETECTED Final   Proteus species NOT DETECTED NOT DETECTED Final   Salmonella species NOT DETECTED NOT DETECTED Final   Serratia marcescens NOT DETECTED NOT DETECTED Final   Haemophilus influenzae NOT DETECTED NOT DETECTED Final   Neisseria meningitidis NOT DETECTED NOT DETECTED Final   Pseudomonas aeruginosa NOT DETECTED NOT DETECTED Final   Stenotrophomonas maltophilia NOT DETECTED NOT DETECTED Final   Candida albicans NOT DETECTED NOT DETECTED Final   Candida auris NOT DETECTED NOT DETECTED Final   Candida glabrata NOT DETECTED NOT DETECTED Final   Candida krusei NOT DETECTED NOT DETECTED Final   Candida parapsilosis NOT DETECTED NOT DETECTED Final   Candida tropicalis NOT DETECTED NOT DETECTED Final   Cryptococcus neoformans/gattii NOT  DETECTED NOT DETECTED Final   Methicillin resistance mecA/C DETECTED (A) NOT DETECTED Final    Comment: CRITICAL RESULT CALLED TO, READ BACK BY AND VERIFIED WITH: RN J ALGHALI @0058  10/14/19 BY S GEZAHEGN  Performed at [facility], [address] [place], [place] [address]   Culture, respiratory (non-expectorated)     Status: None   Collection Time: 11/12/19 10:23 PM   Specimen: Tracheal Aspirate; Respiratory  Result Value Ref Range Status   Specimen Description TRACHEAL ASPIRATE  Final   Special Requests NONE  Final   Gram Stain   Final    RARE WBC PRESENT,BOTH PMN AND MONONUCLEAR NO ORGANISMS SEEN Performed at [facility], [address] [place], [place] [address]    Culture FEW CANDIDA ALBICANS  Final   Report Status 11/15/2019 FINAL  Final  Culture, blood (routine x 2)     Status: None   Collection Time: 11/15/19  3:24 PM   Specimen: BLOOD RIGHT HAND  Result Value Ref Range Status   Specimen Description BLOOD RIGHT HAND  Final   Special Requests   Final    BOTTLES DRAWN AEROBIC AND ANAEROBIC Blood Culture adequate volume   Culture   Final    NO GROWTH 5 DAYS Performed at [facility], [place] [address], [place], [place] [address]    Report Status 11/20/2019 FINAL  Final  Culture, blood (routine x  2)     Status: None   Collection Time: 11/15/19  3:27 PM   Specimen: BLOOD LEFT HAND  Result Value Ref Range Status   Specimen Description BLOOD LEFT HAND  Final   Special Requests   Final    BOTTLES DRAWN AEROBIC AND ANAEROBIC Blood Culture adequate volume   Culture   Final    NO GROWTH 5 DAYS Performed at [facility], [address] [place], [place] [address]    Report Status 11/20/2019 FINAL  Final  Culture, Urine     Status: None   Collection Time: 11/20/19  4:05 PM   Specimen: Urine, Random  Result Value Ref Range Status   Specimen Description URINE, RANDOM  Final   Special Requests NONE  Final   Culture   Final    NO GROWTH Performed at  [facility], [place] [address] [place], [place] [address]    Report Status 11/21/2019 FINAL  Final  Culture, blood (routine x 2)     Status: None   Collection Time: 11/20/19  4:08 PM   Specimen: BLOOD  Result Value Ref Range Status   Specimen Description BLOOD SITE NOT SPECIFIED  Final   Special Requests   Final    BOTTLES DRAWN AEROBIC AND ANAEROBIC Blood Culture adequate volume   Culture   Final    NO GROWTH 5 DAYS Performed at [facility], [place] [address], [place], [place] [address]    Report Status 11/25/2019 FINAL  Final  Culture, blood (routine x 2)     Status: None   Collection Time: 11/20/19  4:15 PM   Specimen: BLOOD  Result Value Ref Range Status   Specimen Description BLOOD SITE NOT SPECIFIED  Final   Special Requests   Final    BOTTLES DRAWN AEROBIC AND ANAEROBIC Blood Culture results may not be optimal due to an excessive volume of blood received in culture bottles   Culture   Final    NO GROWTH 5 DAYS Performed at [facility], [place] [address] [place], [place] [address]    Report Status 11/25/2019 FINAL  Final  Culture, respiratory (non-expectorated)     Status: None   Collection Time: 11/20/19  6:43 PM   Specimen: Tracheal Aspirate; Respiratory  Result Value Ref Range Status   Specimen Description TRACHEAL ASPIRATE  Final   Special Requests NONE  Final   Gram Stain   Final    FEW WBC PRESENT,BOTH PMN AND MONONUCLEAR NO ORGANISMS SEEN Performed at [facility], [address], [place], [place] [address]    Culture RARE CANDIDA ALBICANS  Final   Report Status 11/24/2019 FINAL  Final  C Difficile Quick Screen (NO PCR Reflex)     Status: None   Collection Time: 12/06/19  4:35 PM   Specimen: STOOL  Result Value Ref Range Status   C Diff antigen NEGATIVE NEGATIVE Final   C Diff toxin NEGATIVE NEGATIVE Final   C Diff interpretation No C. difficile detected.  Final    Comment: Performed at [facility], [place] [address],  [place], [place] [address]    Coagulation Studies: No results for input(s): LABPROT, INR in the last 72 hours.  Urinalysis: No results for input(s): COLORURINE, LABSPEC, PHURINE, GLUCOSEU, HGBUR, BILIRUBINUR, KETONESUR, PROTEINUR, UROBILINOGEN, NITRITE, LEUKOCYTESUR in the last 72 hours.  Invalid input(s): APPERANCEUR    Imaging: DG CHEST PORT 1 VIEW  Result Date: 12/09/2019 CLINICAL DATA:  Respiratory failure.  EXAM: PORTABLE CHEST 1 VIEW COMPARISON:  11/28/2019. FINDINGS: Tracheostomy in stable position. Right IJ line noted with tip over the right atrium in unchanged position. Heart size stable. Diffuse bilateral pulmonary infiltrates/edema again noted without interim change. No pleural effusion or pneumothorax. IMPRESSION: 1. Tracheostomy tube in stable position. Right IJ line in unchanged position with tip over the right atrium. 2. Diffuse bilateral pulmonary infiltrates/edema again noted without interim change. Electronically Signed   By: Marcello Moores  Register   On: 12/09/2019 07:10     Medications:       Assessment/ Plan:  69 y.o. female with a PMHx of diabetes mellitus type 2, hyperlipidemia, plantar fasciitis, B12 deficiency, chronic low back pain, restless leg syndrome, GERD, nonalcoholic fatty liver disease, anxiety, nephrolithiasis, irritable bowel syndrome, hypertension, chronic kidney disease stage IIIa, who was admitted to [facility] on 11/05/2019 for ongoing treatment of post COVID-19 recovery.  1.  Acute kidney injury/chronic kidney disease stage IIIa.    Renal function continues to improve.  BUN down to 108 with a creatinine of 1.59.  Maintain the patient on hydration for now.  Follow renal parameters.  2.  Acute respiratory failure secondary to COVID-19 infection.    Patient currently on vent support.  Weaning efforts ongoing.  3.  Anemia of chronic kidney disease.    Hemoglobin acceptable at 10.2.  Continue to monitor.  4.  Secondary hyperparathyroidism.  Phosphorus slightly  higher today at 5.6.  No immediate need for phosphorus binders.  5.  Bacteremia.  1 or 2 culture shows Staph epidermidis.  Dialysis catheter was removed.    LOS: 0 Veronica Melton 11/26/202110:59 AM

## 2019-12-10 DIAGNOSIS — Z9911 Dependence on respirator [ventilator] status: Secondary | ICD-10-CM | POA: Diagnosis not present

## 2019-12-10 DIAGNOSIS — N186 End stage renal disease: Secondary | ICD-10-CM | POA: Diagnosis not present

## 2019-12-10 DIAGNOSIS — U071 COVID-19: Secondary | ICD-10-CM | POA: Diagnosis not present

## 2019-12-10 DIAGNOSIS — J9621 Acute and chronic respiratory failure with hypoxia: Secondary | ICD-10-CM | POA: Diagnosis not present

## 2019-12-10 LAB — RENAL FUNCTION PANEL
Albumin: 3 g/dL — ABNORMAL LOW (ref 3.5–5.0)
Anion gap: 14 (ref 5–15)
BUN: 95 mg/dL — ABNORMAL HIGH (ref 8–23)
CO2: 32 mmol/L (ref 22–32)
Calcium: 10.8 mg/dL — ABNORMAL HIGH (ref 8.9–10.3)
Chloride: 92 mmol/L — ABNORMAL LOW (ref 98–111)
Creatinine, Ser: 1.44 mg/dL — ABNORMAL HIGH (ref 0.44–1.00)
GFR, Estimated: 39 mL/min — ABNORMAL LOW (ref >60)
Glucose, Bld: 154 mg/dL — ABNORMAL HIGH (ref 70–99)
Phosphorus: 5.5 mg/dL — ABNORMAL HIGH (ref 2.5–4.6)
Potassium: 3.4 mmol/L — ABNORMAL LOW (ref 3.5–5.1)
Sodium: 138 mmol/L (ref 135–145)

## 2019-12-10 LAB — BLOOD GAS, ARTERIAL
Acid-Base Excess: 9.1 mmol/L — ABNORMAL HIGH (ref 0.0–2.0)
Bicarbonate: 33.7 mmol/L — ABNORMAL HIGH (ref 20.0–28.0)
FIO2: 35
O2 Saturation: 91.7 %
Patient temperature: 36.8
pCO2 arterial: 50.9 mmHg — ABNORMAL HIGH (ref 32.0–48.0)
pH, Arterial: 7.435 (ref 7.350–7.450)
pO2, Arterial: 60.2 mmHg — ABNORMAL LOW (ref 83.0–108.0)

## 2019-12-10 LAB — CBC
HCT: 30 % — ABNORMAL LOW (ref 36.0–46.0)
Hemoglobin: 9 g/dL — ABNORMAL LOW (ref 12.0–15.0)
MCH: 28.5 pg (ref 26.0–34.0)
MCHC: 30 g/dL (ref 30.0–36.0)
MCV: 94.9 fL (ref 80.0–100.0)
Platelets: 230 10*3/uL (ref 150–400)
RBC: 3.16 MIL/uL — ABNORMAL LOW (ref 3.87–5.11)
RDW: 22.5 % — ABNORMAL HIGH (ref 11.5–15.5)
WBC: 7.8 10*3/uL (ref 4.0–10.5)
nRBC: 0 % (ref 0.0–0.2)

## 2019-12-10 LAB — MAGNESIUM: Magnesium: 1.9 mg/dL (ref 1.7–2.4)

## 2019-12-10 NOTE — Progress Notes (Signed)
Pulmonary Critical Care Medicine [place]   PULMONARY CRITICAL CARE SERVICE  PROGRESS NOTE  Date of Service: 12/10/2019  Veronica Melton  HCW:[id number]  DOB: 1950-03-04   DOA: 11/05/2019  Referring Physician: Merton Border, MD  HPI: Veronica Melton is a 69 y.o. female seen for follow up of Acute on Chronic Respiratory Failure.  She is completed 24 hours of T collar looks good right now patient is on 40% FiO2  Medications: Reviewed on Rounds  Physical Exam:  Vitals: Temperature is 98.1 pulse 85 respiratory rate 16 blood pressure is 107/58 saturations 100%  Ventilator Settings on T collar with an FiO2 40%  . General: Comfortable at this time . Eyes: Grossly normal lids, irises & conjunctiva . ENT: grossly tongue is normal . Neck: no obvious mass . Cardiovascular: S1 S2 normal no gallop . Respiratory: No rhonchi no rales noted at this time . Abdomen: soft . Skin: no rash seen on limited exam . Musculoskeletal: not rigid . Psychiatric:unable to assess . Neurologic: no seizure no involuntary movements         Lab Data:   Basic Metabolic Panel: Recent Labs  Lab 12/05/19 0638 12/05/19 0638 12/06/19 0515 12/07/19 0500 12/08/19 0326 12/09/19 0430 12/10/19 0456  NA 144   < > 147* 148* 146* 140 138  K 4.8   < > 4.3 3.4* 3.8 3.5 3.4*  CL 89*   < > 96* 99 97* 93* 92*  CO2 37*   < > 35* 37* 36* 33* 32  GLUCOSE 157*   < > 193* 170* 178* 151* 154*  BUN 170*   < > 150* 129* 120* 108* 95*  CREATININE 2.56*   < > 2.39* 2.17* 1.76* 1.59* 1.44*  CALCIUM 11.5*   < > 11.9* 11.9* 12.0* 11.0* 10.8*  MG 3.0*  --  2.8*  --  2.3 1.7 1.9  PHOS 5.8*   < > 5.0* 4.9* 5.0* 5.6* 5.5*   < > = values in this interval not displayed.    ABG: No results for input(s): PHART, PCO2ART, PO2ART, HCO3, O2SAT in the last 168 hours.  Liver Function Tests: Recent Labs  Lab 12/04/19 0720 12/05/19 1761 12/06/19 0515 12/07/19 0500 12/08/19 0326 12/09/19 0430 12/10/19 0456   AST 22  --   --   --   --   --   --   ALT 25  --   --   --   --   --   --   ALKPHOS 81  --   --   --   --   --   --   BILITOT 0.9  --   --   --   --   --   --   PROT 7.5  --   --   --   --   --   --   ALBUMIN 3.6   < > 3.7 3.4* 3.3* 3.0* 3.0*   < > = values in this interval not displayed.   No results for input(s): LIPASE, AMYLASE in the last 168 hours. No results for input(s): AMMONIA in the last 168 hours.  CBC: Recent Labs  Lab 12/03/19 1818 12/04/19 0720 12/06/19 0515 12/08/19 0326 12/10/19 0456  WBC 10.5 12.1* 14.5* 11.1* 7.8  HGB 9.8* 10.6* 10.8* 10.2* 9.0*  HCT 32.6* 35.2* 36.4 34.5* 30.0*  MCV 92.9 91.9 93.8 98.3 94.9  PLT 374 366 355 299 230    Cardiac Enzymes: No results for input(s): CKTOTAL, CKMB, CKMBINDEX,  TROPONINI in the last 168 hours.  BNP (last 3 results) No results for input(s): BNP in the last 8760 hours.  ProBNP (last 3 results) No results for input(s): PROBNP in the last 8760 hours.  Radiological Exams: DG CHEST PORT 1 VIEW  Result Date: 12/09/2019 CLINICAL DATA:  Respiratory failure. EXAM: PORTABLE CHEST 1 VIEW COMPARISON:  11/28/2019. FINDINGS: Tracheostomy in stable position. Right IJ line noted with tip over the right atrium in unchanged position. Heart size stable. Diffuse bilateral pulmonary infiltrates/edema again noted without interim change. No pleural effusion or pneumothorax. IMPRESSION: 1. Tracheostomy tube in stable position. Right IJ line in unchanged position with tip over the right atrium. 2. Diffuse bilateral pulmonary infiltrates/edema again noted without interim change. Electronically Signed   By: Marcello Moores  Register   On: 12/09/2019 07:10    Assessment/Plan Active Problems:   Acute on chronic respiratory failure with hypoxia (HCC)   End stage renal disease on dialysis ([place])   COVID-19 virus infection   Pneumonia due to COVID-19 virus   1. Acute on chronic respiratory failure hypoxia we will continue with T collar wean  completing 24 hours 2. End-stage renal failure renal function continues to show improvement 3. COVID-19 virus infection recovery 4. Pneumonia due to COVID-19 slow improvement   I have personally seen and evaluated the patient, evaluated laboratory and imaging results, formulated the assessment and plan and placed orders. The Patient requires high complexity decision making with multiple systems involvement.  Rounds were done with the Respiratory Therapy Director and Staff therapists and discussed with nursing staff also.  Allyne Gee, MD [facility] Pulmonary Critical Care Medicine Sleep Medicine

## 2019-12-11 LAB — RENAL FUNCTION PANEL
Albumin: 3.1 g/dL — ABNORMAL LOW (ref 3.5–5.0)
Anion gap: 12 (ref 5–15)
BUN: 87 mg/dL — ABNORMAL HIGH (ref 8–23)
CO2: 34 mmol/L — ABNORMAL HIGH (ref 22–32)
Calcium: 11.1 mg/dL — ABNORMAL HIGH (ref 8.9–10.3)
Chloride: 92 mmol/L — ABNORMAL LOW (ref 98–111)
Creatinine, Ser: 1.33 mg/dL — ABNORMAL HIGH (ref 0.44–1.00)
GFR, Estimated: 43 mL/min — ABNORMAL LOW (ref >60)
Glucose, Bld: 144 mg/dL — ABNORMAL HIGH (ref 70–99)
Phosphorus: 4.3 mg/dL (ref 2.5–4.6)
Potassium: 3.9 mmol/L (ref 3.5–5.1)
Sodium: 138 mmol/L (ref 135–145)

## 2019-12-11 LAB — MAGNESIUM: Magnesium: 1.7 mg/dL (ref 1.7–2.4)

## 2019-12-12 DIAGNOSIS — U071 COVID-19: Secondary | ICD-10-CM | POA: Diagnosis not present

## 2019-12-12 DIAGNOSIS — J9621 Acute and chronic respiratory failure with hypoxia: Secondary | ICD-10-CM | POA: Diagnosis not present

## 2019-12-12 DIAGNOSIS — Z9911 Dependence on respirator [ventilator] status: Secondary | ICD-10-CM | POA: Diagnosis not present

## 2019-12-12 DIAGNOSIS — N186 End stage renal disease: Secondary | ICD-10-CM | POA: Diagnosis not present

## 2019-12-12 LAB — RENAL FUNCTION PANEL
Albumin: 3.5 g/dL (ref 3.5–5.0)
Anion gap: 15 (ref 5–15)
BUN: 63 mg/dL — ABNORMAL HIGH (ref 8–23)
CO2: 31 mmol/L (ref 22–32)
Calcium: 11.3 mg/dL — ABNORMAL HIGH (ref 8.9–10.3)
Chloride: 93 mmol/L — ABNORMAL LOW (ref 98–111)
Creatinine, Ser: 1.16 mg/dL — ABNORMAL HIGH (ref 0.44–1.00)
GFR, Estimated: 51 mL/min — ABNORMAL LOW (ref >60)
Glucose, Bld: 156 mg/dL — ABNORMAL HIGH (ref 70–99)
Phosphorus: 4.1 mg/dL (ref 2.5–4.6)
Potassium: 4.2 mmol/L (ref 3.5–5.1)
Sodium: 139 mmol/L (ref 135–145)

## 2019-12-12 LAB — TRIGLYCERIDES: Triglycerides: 724 mg/dL — ABNORMAL HIGH (ref <150)

## 2019-12-12 LAB — MAGNESIUM: Magnesium: 2.3 mg/dL (ref 1.7–2.4)

## 2019-12-12 NOTE — Progress Notes (Signed)
[facility]  ROUNDING NOTE   Subjective:  Patient now off of ventilator. Renal function continues to improve his BUN down to 63 with a creatinine of 1.1. Urine output was 1.7 L over the preceding 24 hours.   Objective:  Vital signs in last 24 hours:  Temperature 98.9 pulse 89 respirations 20 blood pressure 114/67  Physical Exam: General:  No acute distress  Head:  Normocephalic, atraumatic. Moist oral mucosal membranes  Eyes:  Anicteric  Neck:  Tracheostomy in place  Lungs:   Scattered rhonchi, normal effort  Heart:  S1S2 no rubs  Abdomen:   Soft, nontender, bowel sounds present  Extremities:  No peripheral edema.  Neurologic:  Awake, alert  Skin:  No acute rashes  Access:  No dialysis access    Basic Metabolic Panel: Recent Labs  Lab 12/08/19 0326 12/08/19 0326 12/09/19 0430 12/09/19 0430 12/10/19 0456 12/11/19 0302 12/12/19 0403  NA 146*  --  140  --  138 138 139  K 3.8  --  3.5  --  3.4* 3.9 4.2  CL 97*  --  93*  --  92* 92* 93*  CO2 36*  --  33*  --  32 34* 31  GLUCOSE 178*  --  151*  --  154* 144* 156*  BUN 120*  --  108*  --  95* 87* 63*  CREATININE 1.76*  --  1.59*  --  1.44* 1.33* 1.16*  CALCIUM 12.0*   < > 11.0*   < > 10.8* 11.1* 11.3*  MG 2.3  --  1.7  --  1.9 1.7 2.3  PHOS 5.0*  --  5.6*  --  5.5* 4.3 4.1   < > = values in this interval not displayed.    Liver Function Tests: Recent Labs  Lab 12/08/19 0326 12/09/19 0430 12/10/19 0456 12/11/19 0302 12/12/19 0403  ALBUMIN 3.3* 3.0* 3.0* 3.1* 3.5   No results for input(s): LIPASE, AMYLASE in the last 168 hours. No results for input(s): AMMONIA in the last 168 hours.  CBC: Recent Labs  Lab 12/06/19 0515 12/08/19 0326 12/10/19 0456  WBC 14.5* 11.1* 7.8  HGB 10.8* 10.2* 9.0*  HCT 36.4 34.5* 30.0*  MCV 93.8 98.3 94.9  PLT 355 299 230    Cardiac Enzymes: No results for input(s): CKTOTAL, CKMB, CKMBINDEX, TROPONINI in the last 168 hours.  BNP: Invalid input(s):  POCBNP  CBG: No results for input(s): GLUCAP in the last 168 hours.  Microbiology: Results for orders placed or performed during the hospital encounter of 11/05/19  Culture, respiratory (non-expectorated)     Status: None   Collection Time: 11/06/19  1:46 PM   Specimen: Tracheal Aspirate; Respiratory  Result Value Ref Range Status   Specimen Description TRACHEAL ASPIRATE  Final   Special Requests NONE  Final   Gram Stain   Final    RARE WBC PRESENT, PREDOMINANTLY PMN NO ORGANISMS SEEN    Culture   Final    RARE Normal respiratory flora-no Staph aureus or Pseudomonas seen Performed at [facility] [address] [place], [place] [address]    Report Status 11/08/2019 FINAL  Final  Urine Culture     Status: Abnormal   Collection Time: 11/10/19  1:36 PM   Specimen: Urine, Random  Result Value Ref Range Status   Specimen Description URINE, RANDOM  Final   Special Requests   Final    NONE Performed at [facility], [place] [address] [place], [place]  [address]    Culture 60,000 COLONIES/mL YEAST (A)  Final   Report Status 11/11/2019 FINAL  Final  Culture, blood (routine x 2)     Status: None   Collection Time: 11/12/19 10:00 PM   Specimen: BLOOD  Result Value Ref Range Status   Specimen Description BLOOD SITE NOT SPECIFIED  Final   Special Requests   Final    BOTTLES DRAWN AEROBIC AND ANAEROBIC Blood Culture results may not be optimal due to an excessive volume of blood received in culture bottles   Culture   Final    NO GROWTH 5 DAYS Performed at [facility], [place] [address], [place], [place] [address]    Report Status 11/17/2019 FINAL  Final  Culture, blood (routine x 2)     Status: Abnormal   Collection Time: 11/12/19 10:06 PM   Specimen: BLOOD  Result Value Ref Range Status   Specimen Description BLOOD SITE NOT SPECIFIED  Final   Special Requests   Final    BOTTLES DRAWN AEROBIC AND ANAEROBIC Blood Culture results may not be optimal due to an  excessive volume of blood received in culture bottles   Culture  Setup Time   Final    GRAM POSITIVE COCCI ANAEROBIC BOTTLE ONLY CRITICAL RESULT CALLED TO, READ BACK BY AND VERIFIED WITH: RN J ALGHALI @0058  10/14/19 BY S GEZAHEGN  Performed at [facility], [place] [address], [place], [place] [address]    Culture STAPHYLOCOCCUS EPIDERMIDIS (A)  Final   Report Status 11/16/2019 FINAL  Final   Organism ID, Bacteria STAPHYLOCOCCUS EPIDERMIDIS  Final      Susceptibility   Staphylococcus epidermidis - MIC*    CIPROFLOXACIN >=8 RESISTANT Resistant     ERYTHROMYCIN >=8 RESISTANT Resistant     GENTAMICIN 8 INTERMEDIATE Intermediate     OXACILLIN >=4 RESISTANT Resistant     TETRACYCLINE 2 SENSITIVE Sensitive     VANCOMYCIN 1 SENSITIVE Sensitive     TRIMETH/SULFA 80 RESISTANT Resistant     CLINDAMYCIN >=8 RESISTANT Resistant     RIFAMPIN <=0.5 SENSITIVE Sensitive     Inducible Clindamycin NEGATIVE Sensitive     * STAPHYLOCOCCUS EPIDERMIDIS  Blood Culture ID Panel (Reflexed)     Status: Abnormal   Collection Time: 11/12/19 10:06 PM  Result Value Ref Range Status   Enterococcus faecalis NOT DETECTED NOT DETECTED Final   Enterococcus Faecium NOT DETECTED NOT DETECTED Final   Listeria monocytogenes NOT DETECTED NOT DETECTED Final   Staphylococcus species DETECTED (A) NOT DETECTED Final    Comment: CRITICAL RESULT CALLED TO, READ BACK BY AND VERIFIED WITH: RN J ALGHALI @0058  10/14/19 BY S GEZAHEGN     Staphylococcus aureus (BCID) NOT DETECTED NOT DETECTED Final   Staphylococcus epidermidis DETECTED (A) NOT DETECTED Final    Comment: Methicillin (oxacillin) resistant coagulase negative staphylococcus. Possible blood culture contaminant (unless isolated from more than one blood culture draw or clinical case suggests pathogenicity). No antibiotic treatment is indicated for blood  culture contaminants. CRITICAL RESULT CALLED TO, READ BACK BY AND VERIFIED WITH: RN J ALGHALI @0058  10/14/19 BY S  GEZAHEGN     Staphylococcus lugdunensis NOT DETECTED NOT DETECTED Final   Streptococcus species NOT DETECTED NOT DETECTED Final   Streptococcus agalactiae NOT DETECTED NOT DETECTED Final   Streptococcus pneumoniae NOT DETECTED NOT DETECTED Final   Streptococcus pyogenes NOT DETECTED NOT DETECTED Final   A.calcoaceticus-baumannii NOT DETECTED NOT DETECTED Final   Bacteroides fragilis NOT DETECTED NOT DETECTED Final  Enterobacterales NOT DETECTED NOT DETECTED Final   Enterobacter cloacae complex NOT DETECTED NOT DETECTED Final   Escherichia coli NOT DETECTED NOT DETECTED Final   Klebsiella aerogenes NOT DETECTED NOT DETECTED Final   Klebsiella oxytoca NOT DETECTED NOT DETECTED Final   Klebsiella pneumoniae NOT DETECTED NOT DETECTED Final   Proteus species NOT DETECTED NOT DETECTED Final   Salmonella species NOT DETECTED NOT DETECTED Final   Serratia marcescens NOT DETECTED NOT DETECTED Final   Haemophilus influenzae NOT DETECTED NOT DETECTED Final   Neisseria meningitidis NOT DETECTED NOT DETECTED Final   Pseudomonas aeruginosa NOT DETECTED NOT DETECTED Final   Stenotrophomonas maltophilia NOT DETECTED NOT DETECTED Final   Candida albicans NOT DETECTED NOT DETECTED Final   Candida auris NOT DETECTED NOT DETECTED Final   Candida glabrata NOT DETECTED NOT DETECTED Final   Candida krusei NOT DETECTED NOT DETECTED Final   Candida parapsilosis NOT DETECTED NOT DETECTED Final   Candida tropicalis NOT DETECTED NOT DETECTED Final   Cryptococcus neoformans/gattii NOT DETECTED NOT DETECTED Final   Methicillin resistance mecA/C DETECTED (A) NOT DETECTED Final    Comment: CRITICAL RESULT CALLED TO, READ BACK BY AND VERIFIED WITH: RN J ALGHALI @0058  10/14/19 BY S GEZAHEGN  Performed at [facility], [address] [place], [place] [address]   Culture, respiratory (non-expectorated)     Status: None   Collection Time: 11/12/19 10:23 PM   Specimen: Tracheal Aspirate; Respiratory  Result  Value Ref Range Status   Specimen Description TRACHEAL ASPIRATE  Final   Special Requests NONE  Final   Gram Stain   Final    RARE WBC PRESENT,BOTH PMN AND MONONUCLEAR NO ORGANISMS SEEN Performed at [facility], [address], [place], [place] [address]    Culture FEW CANDIDA ALBICANS  Final   Report Status 11/15/2019 FINAL  Final  Culture, blood (routine x 2)     Status: None   Collection Time: 11/15/19  3:24 PM   Specimen: BLOOD RIGHT HAND  Result Value Ref Range Status   Specimen Description BLOOD RIGHT HAND  Final   Special Requests   Final    BOTTLES DRAWN AEROBIC AND ANAEROBIC Blood Culture adequate volume   Culture   Final    NO GROWTH 5 DAYS Performed at [facility], [place] [address], [place], [place] [address]    Report Status 11/20/2019 FINAL  Final  Culture, blood (routine x 2)     Status: None   Collection Time: 11/15/19  3:27 PM   Specimen: BLOOD LEFT HAND  Result Value Ref Range Status   Specimen Description BLOOD LEFT HAND  Final   Special Requests   Final    BOTTLES DRAWN AEROBIC AND ANAEROBIC Blood Culture adequate volume   Culture   Final    NO GROWTH 5 DAYS Performed at [facility], [place] [address] [place], [place] [address]    Report Status 11/20/2019 FINAL  Final  Culture, Urine     Status: None   Collection Time: 11/20/19  4:05 PM   Specimen: Urine, Random  Result Value Ref Range Status   Specimen Description URINE, RANDOM  Final   Special Requests NONE  Final   Culture   Final    NO GROWTH Performed at [facility], [place] [address], [place], [place] [address]    Report Status 11/21/2019 FINAL  Final  Culture, blood (routine x 2)     Status: None   Collection  Time: 11/20/19  4:08 PM   Specimen: BLOOD  Result Value Ref Range Status   Specimen Description BLOOD SITE NOT SPECIFIED  Final   Special Requests   Final    BOTTLES DRAWN AEROBIC AND ANAEROBIC Blood Culture adequate volume   Culture   Final    NO GROWTH 5  DAYS Performed at [facility], [address], [place], [place] [address]    Report Status 11/25/2019 FINAL  Final  Culture, blood (routine x 2)     Status: None   Collection Time: 11/20/19  4:15 PM   Specimen: BLOOD  Result Value Ref Range Status   Specimen Description BLOOD SITE NOT SPECIFIED  Final   Special Requests   Final    BOTTLES DRAWN AEROBIC AND ANAEROBIC Blood Culture results may not be optimal due to an excessive volume of blood received in culture bottles   Culture   Final    NO GROWTH 5 DAYS Performed at [facility], [place] [address], [place], [place] [address]    Report Status 11/25/2019 FINAL  Final  Culture, respiratory (non-expectorated)     Status: None   Collection Time: 11/20/19  6:43 PM   Specimen: Tracheal Aspirate; Respiratory  Result Value Ref Range Status   Specimen Description TRACHEAL ASPIRATE  Final   Special Requests NONE  Final   Gram Stain   Final    FEW WBC PRESENT,BOTH PMN AND MONONUCLEAR NO ORGANISMS SEEN Performed at [facility], [address] [place], [place] [address]    Culture RARE CANDIDA ALBICANS  Final   Report Status 11/24/2019 FINAL  Final  C Difficile Quick Screen (NO PCR Reflex)     Status: None   Collection Time: 12/06/19  4:35 PM   Specimen: STOOL  Result Value Ref Range Status   C Diff antigen NEGATIVE NEGATIVE Final   C Diff toxin NEGATIVE NEGATIVE Final   C Diff interpretation No C. difficile detected.  Final    Comment: Performed at [facility], [place] [address] [place],  [address]    Coagulation Studies: No results for input(s): LABPROT, INR in the last 72 hours.  Urinalysis: No results for input(s): COLORURINE, LABSPEC, PHURINE, GLUCOSEU, HGBUR, BILIRUBINUR, KETONESUR, PROTEINUR, UROBILINOGEN, NITRITE, LEUKOCYTESUR in the last 72 hours.  Invalid input(s): APPERANCEUR    Imaging: No results found.   Medications:       Assessment/ Plan:  69 y.o. female with a PMHx of  diabetes mellitus type 2, hyperlipidemia, plantar fasciitis, B12 deficiency, chronic low back pain, restless leg syndrome, GERD, nonalcoholic fatty liver disease, anxiety, nephrolithiasis, irritable bowel syndrome, hypertension, chronic kidney disease stage IIIa, who was admitted to [facility] on 11/05/2019 for ongoing treatment of post COVID-19 recovery.  1.  Acute kidney injury/chronic kidney disease stage IIIa.    Patient's renal parameters continue to improve.  BUN down to 63 with a creatinine 1.16 and urine output of 1.7 L over the preceding 24 hours.  Continue to periodically monitor renal function.  2.  Acute respiratory failure secondary to COVID-19 infection.    Patient has been weaned from the ventilator.  Hopefully she can be decannulated soon.  3.  Anemia of chronic kidney disease.    Hemoglobin 9.0 at last check.  4.  Secondary hyperparathyroidism.  Phosphorus down to 4.1 and acceptable.  5.  Bacteremia.  1 or 2 culture shows Staph epidermidis.  Dialysis catheter was removed.    LOS: 0 Veronica Melton 11/29/20217:51 AM

## 2019-12-12 NOTE — Progress Notes (Signed)
Pulmonary Critical Care Medicine [place]   PULMONARY CRITICAL CARE SERVICE  PROGRESS NOTE  Date of Service: 12/12/2019  Veronica Melton  EEF:[id number]  DOB: [birth year]   DOA: 11/05/2019  Referring Physician: Merton Border, MD  HPI: Veronica Melton is a 69 y.o. female seen for follow up of Acute on Chronic Respiratory Failure.  She is doing well with on T collar has been using the PMV without any distress ready for downsizing to a cuffless #6  Medications: Reviewed on Rounds  Physical Exam:  Vitals: Temperature is 98.9 pulse 89 respiratory rate 20 blood pressure is 117/67 saturations 98%  Ventilator Settings off the ventilator on T collar  . General: Comfortable at this time . Eyes: Grossly normal lids, irises & conjunctiva . ENT: grossly tongue is normal . Neck: no obvious mass . Cardiovascular: S1 S2 normal no gallop . Respiratory: No rhonchi no rales are noted at this time . Abdomen: soft . Skin: no rash seen on limited exam . Musculoskeletal: not rigid . Psychiatric:unable to assess . Neurologic: no seizure no involuntary movements         Lab Data:   Basic Metabolic Panel: Recent Labs  Lab 12/08/19 0326 12/09/19 0430 12/10/19 0456 12/11/19 0302 12/12/19 0403  NA 146* 140 138 138 139  K 3.8 3.5 3.4* 3.9 4.2  CL 97* 93* 92* 92* 93*  CO2 36* 33* 32 34* 31  GLUCOSE 178* 151* 154* 144* 156*  BUN 120* 108* 95* 87* 63*  CREATININE 1.76* 1.59* 1.44* 1.33* 1.16*  CALCIUM 12.0* 11.0* 10.8* 11.1* 11.3*  MG 2.3 1.7 1.9 1.7 2.3  PHOS 5.0* 5.6* 5.5* 4.3 4.1    ABG: Recent Labs  Lab 12/10/19 1210  PHART 7.435  PCO2ART 50.9*  PO2ART 60.2*  HCO3 33.7*  O2SAT 91.7    Liver Function Tests: Recent Labs  Lab 12/08/19 0326 12/09/19 0430 12/10/19 0456 12/11/19 0302 12/12/19 0403  ALBUMIN 3.3* 3.0* 3.0* 3.1* 3.5   No results for input(s): LIPASE, AMYLASE in the last 168 hours. No results for input(s): AMMONIA in the last 168  hours.  CBC: Recent Labs  Lab 12/06/19 0515 12/08/19 0326 12/10/19 0456  WBC 14.5* 11.1* 7.8  HGB 10.8* 10.2* 9.0*  HCT 36.4 34.5* 30.0*  MCV 93.8 98.3 94.9  PLT 355 299 230    Cardiac Enzymes: No results for input(s): CKTOTAL, CKMB, CKMBINDEX, TROPONINI in the last 168 hours.  BNP (last 3 results) No results for input(s): BNP in the last 8760 hours.  ProBNP (last 3 results) No results for input(s): PROBNP in the last 8760 hours.  Radiological Exams: No results found.  Assessment/Plan Active Problems:   Acute on chronic respiratory failure with hypoxia (HCC)   End stage renal disease on dialysis ([place])   COVID-19 virus infection   Pneumonia due to COVID-19 virus   1. Acute on chronic respiratory failure hypoxia we will continue with T collar trials and change the trach over to a cuffless trach 2. End-stage renal failure on hemodialysis continue with supportive care 3. COVID-19 virus infection in recovery 4. Pneumonia due to COVID-19 treated and improving   I have personally seen and evaluated the patient, evaluated laboratory and imaging results, formulated the assessment and plan and placed orders. The Patient requires high complexity decision making with multiple systems involvement.  Rounds were done with the Respiratory Therapy Director and Staff therapists and discussed with nursing staff also.  Allyne Gee, MD [facility] Pulmonary Critical Care Medicine Sleep Medicine

## 2019-12-13 DIAGNOSIS — U071 COVID-19: Secondary | ICD-10-CM | POA: Diagnosis not present

## 2019-12-13 DIAGNOSIS — J9621 Acute and chronic respiratory failure with hypoxia: Secondary | ICD-10-CM | POA: Diagnosis not present

## 2019-12-13 DIAGNOSIS — N186 End stage renal disease: Secondary | ICD-10-CM | POA: Diagnosis not present

## 2019-12-13 DIAGNOSIS — Z9911 Dependence on respirator [ventilator] status: Secondary | ICD-10-CM | POA: Diagnosis not present

## 2019-12-13 NOTE — Progress Notes (Signed)
Pulmonary Critical Care Medicine [place]   PULMONARY CRITICAL CARE SERVICE  PROGRESS NOTE  Date of Service: 12/13/2019  Veronica Melton  DQQ:[id number]  DOB: [birth year]   DOA: 11/05/2019  Referring Physician: Merton Border, MD  HPI: Veronica Melton is a 69 y.o. female seen for follow up of Acute on Chronic Respiratory Failure.  Patient is on T collar currently on 30% FiO2 will be attempted on PMV today  Medications: Reviewed on Rounds  Physical Exam:  Vitals: Temperature is 96.9 pulse 89 respiratory rate is 27 blood pressure is 144/56 saturations 97%  Ventilator Settings on T collar FiO2 30%  . General: Comfortable at this time . Eyes: Grossly normal lids, irises & conjunctiva . ENT: grossly tongue is normal . Neck: no obvious mass . Cardiovascular: S1 S2 normal no gallop . Respiratory: No rhonchi very coarse breath . Abdomen: soft . Skin: no rash seen on limited exam . Musculoskeletal: not rigid . Psychiatric:unable to assess . Neurologic: no seizure no involuntary movements         Lab Data:   Basic Metabolic Panel: Recent Labs  Lab 12/08/19 0326 12/09/19 0430 12/10/19 0456 12/11/19 0302 12/12/19 0403  NA 146* 140 138 138 139  K 3.8 3.5 3.4* 3.9 4.2  CL 97* 93* 92* 92* 93*  CO2 36* 33* 32 34* 31  GLUCOSE 178* 151* 154* 144* 156*  BUN 120* 108* 95* 87* 63*  CREATININE 1.76* 1.59* 1.44* 1.33* 1.16*  CALCIUM 12.0* 11.0* 10.8* 11.1* 11.3*  MG 2.3 1.7 1.9 1.7 2.3  PHOS 5.0* 5.6* 5.5* 4.3 4.1    ABG: Recent Labs  Lab 12/10/19 1210  PHART 7.435  PCO2ART 50.9*  PO2ART 60.2*  HCO3 33.7*  O2SAT 91.7    Liver Function Tests: Recent Labs  Lab 12/08/19 0326 12/09/19 0430 12/10/19 0456 12/11/19 0302 12/12/19 0403  ALBUMIN 3.3* 3.0* 3.0* 3.1* 3.5   No results for input(s): LIPASE, AMYLASE in the last 168 hours. No results for input(s): AMMONIA in the last 168 hours.  CBC: Recent Labs  Lab 12/08/19 0326 12/10/19 0456  WBC  11.1* 7.8  HGB 10.2* 9.0*  HCT 34.5* 30.0*  MCV 98.3 94.9  PLT 299 230    Cardiac Enzymes: No results for input(s): CKTOTAL, CKMB, CKMBINDEX, TROPONINI in the last 168 hours.  BNP (last 3 results) No results for input(s): BNP in the last 8760 hours.  ProBNP (last 3 results) No results for input(s): PROBNP in the last 8760 hours.  Radiological Exams: No results found.  Assessment/Plan Active Problems:   Acute on chronic respiratory failure with hypoxia (HCC)   End stage renal disease on dialysis ([place])   COVID-19 virus infection   Pneumonia due to COVID-19 virus   1. Acute on chronic respiratory failure hypoxia we will continue with T-piece titrate oxygen continue pulmonary toilet. 2. End-stage renal failure on hemodialysis 3. COVID-19 virus infection 4. Pneumonia due to COVID-19 slow improvement we will continue to follow   I have personally seen and evaluated the patient, evaluated laboratory and imaging results, formulated the assessment and plan and placed orders. The Patient requires high complexity decision making with multiple systems involvement.  Rounds were done with the Respiratory Therapy Director and Staff therapists and discussed with nursing staff also.  Allyne Gee, MD [facility] Pulmonary Critical Care Medicine Sleep Medicine

## 2019-12-14 ENCOUNTER — Other Ambulatory Visit (HOSPITAL_COMMUNITY): Payer: Medicare Other

## 2019-12-14 DIAGNOSIS — J9621 Acute and chronic respiratory failure with hypoxia: Secondary | ICD-10-CM | POA: Diagnosis not present

## 2019-12-14 DIAGNOSIS — Z9911 Dependence on respirator [ventilator] status: Secondary | ICD-10-CM | POA: Diagnosis not present

## 2019-12-14 DIAGNOSIS — N186 End stage renal disease: Secondary | ICD-10-CM | POA: Diagnosis not present

## 2019-12-14 DIAGNOSIS — U071 COVID-19: Secondary | ICD-10-CM | POA: Diagnosis not present

## 2019-12-14 LAB — CBC
HCT: 30.1 % — ABNORMAL LOW (ref 36.0–46.0)
Hemoglobin: 9.1 g/dL — ABNORMAL LOW (ref 12.0–15.0)
MCH: 28.6 pg (ref 26.0–34.0)
MCHC: 30.2 g/dL (ref 30.0–36.0)
MCV: 94.7 fL (ref 80.0–100.0)
Platelets: 271 10*3/uL (ref 150–400)
RBC: 3.18 MIL/uL — ABNORMAL LOW (ref 3.87–5.11)
RDW: 22.5 % — ABNORMAL HIGH (ref 11.5–15.5)
WBC: 9.9 10*3/uL (ref 4.0–10.5)
nRBC: 0 % (ref 0.0–0.2)

## 2019-12-14 LAB — RENAL FUNCTION PANEL
Albumin: 3.2 g/dL — ABNORMAL LOW (ref 3.5–5.0)
Anion gap: 16 — ABNORMAL HIGH (ref 5–15)
BUN: 58 mg/dL — ABNORMAL HIGH (ref 8–23)
CO2: 30 mmol/L (ref 22–32)
Calcium: 11.2 mg/dL — ABNORMAL HIGH (ref 8.9–10.3)
Chloride: 93 mmol/L — ABNORMAL LOW (ref 98–111)
Creatinine, Ser: 1.27 mg/dL — ABNORMAL HIGH (ref 0.44–1.00)
GFR, Estimated: 45 mL/min — ABNORMAL LOW (ref >60)
Glucose, Bld: 145 mg/dL — ABNORMAL HIGH (ref 70–99)
Phosphorus: 6 mg/dL — ABNORMAL HIGH (ref 2.5–4.6)
Potassium: 3.5 mmol/L (ref 3.5–5.1)
Sodium: 139 mmol/L (ref 135–145)

## 2019-12-14 NOTE — Progress Notes (Signed)
Pulmonary Critical Care Medicine [place]   PULMONARY CRITICAL CARE SERVICE  PROGRESS NOTE  Date of Service: 12/14/2019  Blayne Garlick  XVQ:[id number]  DOB: Jul 20, 1950   DOA: 11/05/2019  Referring Physician: Merton Border, MD  HPI: Veronica Melton is a 69 y.o. female seen for follow up of Acute on Chronic Respiratory Failure.  Patient is on T collar has been using PMV yesterday we will try capping trials  Medications: Reviewed on Rounds  Physical Exam:  Vitals: Temperature is 98.4 pulse 97 respiratory rate 26 blood pressure is 116/62 saturations 98%  Ventilator Settings on T collar off the vent  . General: Comfortable at this time . Eyes: Grossly normal lids, irises & conjunctiva . ENT: grossly tongue is normal . Neck: no obvious mass . Cardiovascular: S1 S2 normal no gallop . Respiratory: No rhonchi no rales noted at this time . Abdomen: soft . Skin: no rash seen on limited exam . Musculoskeletal: not rigid . Psychiatric:unable to assess . Neurologic: no seizure no involuntary movements         Lab Data:   Basic Metabolic Panel: Recent Labs  Lab 12/08/19 0326 12/08/19 0326 12/09/19 0430 12/10/19 0456 12/11/19 0302 12/12/19 0403 12/14/19 0425  NA 146*   < > 140 138 138 139 139  K 3.8   < > 3.5 3.4* 3.9 4.2 3.5  CL 97*   < > 93* 92* 92* 93* 93*  CO2 36*   < > 33* 32 34* 31 30  GLUCOSE 178*   < > 151* 154* 144* 156* 145*  BUN 120*   < > 108* 95* 87* 63* 58*  CREATININE 1.76*   < > 1.59* 1.44* 1.33* 1.16* 1.27*  CALCIUM 12.0*   < > 11.0* 10.8* 11.1* 11.3* 11.2*  MG 2.3  --  1.7 1.9 1.7 2.3  --   PHOS 5.0*   < > 5.6* 5.5* 4.3 4.1 6.0*   < > = values in this interval not displayed.    ABG: Recent Labs  Lab 12/10/19 1210  PHART 7.435  PCO2ART 50.9*  PO2ART 60.2*  HCO3 33.7*  O2SAT 91.7    Liver Function Tests: Recent Labs  Lab 12/09/19 0430 12/10/19 0456 12/11/19 0302 12/12/19 0403 12/14/19 0425  ALBUMIN 3.0* 3.0* 3.1* 3.5  3.2*   No results for input(s): LIPASE, AMYLASE in the last 168 hours. No results for input(s): AMMONIA in the last 168 hours.  CBC: Recent Labs  Lab 12/08/19 0326 12/10/19 0456 12/14/19 0425  WBC 11.1* 7.8 9.9  HGB 10.2* 9.0* 9.1*  HCT 34.5* 30.0* 30.1*  MCV 98.3 94.9 94.7  PLT 299 230 271    Cardiac Enzymes: No results for input(s): CKTOTAL, CKMB, CKMBINDEX, TROPONINI in the last 168 hours.  BNP (last 3 results) No results for input(s): BNP in the last 8760 hours.  ProBNP (last 3 results) No results for input(s): PROBNP in the last 8760 hours.  Radiological Exams: No results found.  Assessment/Plan Active Problems:   Acute on chronic respiratory failure with hypoxia (HCC)   End stage renal disease on dialysis ([place])   COVID-19 virus infection   Pneumonia due to COVID-19 virus   1. Acute on chronic respiratory failure hypoxia we will continue with T collar trials titrate oxygen and continue pulmonary toilet. 2. End-stage renal disease has significant improvement being seen by nephrology 3. COVID-19 virus infection in recovery 4. Pneumonia due to COVID-19 treated slowly improving we will continue to follow along.   I  have personally seen and evaluated the patient, evaluated laboratory and imaging results, formulated the assessment and plan and placed orders. The Patient requires high complexity decision making with multiple systems involvement.  Rounds were done with the Respiratory Therapy Director and Staff therapists and discussed with nursing staff also.  Allyne Gee, MD [facility] Pulmonary Critical Care Medicine Sleep Medicine

## 2019-12-14 NOTE — Progress Notes (Signed)
[facility]  ROUNDING NOTE   Subjective:  Patient seen and evaluated at bedside. Tracheostomy now capped. Creatinine currently 1.2 with a BUN of 58.   Objective:  Vital signs in last 24 hours:  Temperature 98.4 pulse 91 respirations 26 blood pressure 116/62  Physical Exam: General:  No acute distress  Head:  Normocephalic, atraumatic. Moist oral mucosal membranes  Eyes:  Anicteric  Neck:  Tracheostomy in place, capped  Lungs:   Scattered rhonchi, normal effort  Heart:  S1S2 no rubs  Abdomen:   Soft, nontender, bowel sounds present  Extremities:  No peripheral edema.  Neurologic:  Awake, alert  Skin:  No acute rashes  Access:  No dialysis access    Basic Metabolic Panel: Recent Labs  Lab 12/08/19 0326 12/08/19 0326 12/09/19 0430 12/09/19 0430 12/10/19 0456 12/10/19 0456 12/11/19 0302 12/12/19 0403 12/14/19 0425  NA 146*   < > 140  --  138  --  138 139 139  K 3.8   < > 3.5  --  3.4*  --  3.9 4.2 3.5  CL 97*   < > 93*  --  92*  --  92* 93* 93*  CO2 36*   < > 33*  --  32  --  34* 31 30  GLUCOSE 178*   < > 151*  --  154*  --  144* 156* 145*  BUN 120*   < > 108*  --  95*  --  87* 63* 58*  CREATININE 1.76*   < > 1.59*  --  1.44*  --  1.33* 1.16* 1.27*  CALCIUM 12.0*   < > 11.0*   < > 10.8*   < > 11.1* 11.3* 11.2*  MG 2.3  --  1.7  --  1.9  --  1.7 2.3  --   PHOS 5.0*   < > 5.6*  --  5.5*  --  4.3 4.1 6.0*   < > = values in this interval not displayed.    Liver Function Tests: Recent Labs  Lab 12/09/19 0430 12/10/19 0456 12/11/19 0302 12/12/19 0403 12/14/19 0425  ALBUMIN 3.0* 3.0* 3.1* 3.5 3.2*   No results for input(s): LIPASE, AMYLASE in the last 168 hours. No results for input(s): AMMONIA in the last 168 hours.  CBC: Recent Labs  Lab 12/08/19 0326 12/10/19 0456 12/14/19 0425  WBC 11.1* 7.8 9.9  HGB 10.2* 9.0* 9.1*  HCT 34.5* 30.0* 30.1*  MCV 98.3 94.9 94.7  PLT 299 230 271    Cardiac Enzymes: No results for input(s): CKTOTAL,  CKMB, CKMBINDEX, TROPONINI in the last 168 hours.  BNP: Invalid input(s): POCBNP  CBG: No results for input(s): GLUCAP in the last 168 hours.  Microbiology: Results for orders placed or performed during the hospital encounter of 11/05/19  Culture, respiratory (non-expectorated)     Status: None   Collection Time: 11/06/19  1:46 PM   Specimen: Tracheal Aspirate; Respiratory  Result Value Ref Range Status   Specimen Description TRACHEAL ASPIRATE  Final   Special Requests NONE  Final   Gram Stain   Final    RARE WBC PRESENT, PREDOMINANTLY PMN NO ORGANISMS SEEN    Culture   Final    RARE Normal respiratory flora-no Staph aureus or Pseudomonas seen Performed at [facility] [address], [place], [place] [address]    Report Status 11/08/2019 FINAL  Final  Urine Culture     Status: Abnormal   Collection Time: 11/10/19  1:36  PM   Specimen: Urine, Random  Result Value Ref Range Status   Specimen Description URINE, RANDOM  Final   Special Requests   Final    NONE Performed at [facility], [address] [place], [place] [address]    Culture 60,000 COLONIES/mL YEAST (A)  Final   Report Status 11/11/2019 FINAL  Final  Culture, blood (routine x 2)     Status: None   Collection Time: 11/12/19 10:00 PM   Specimen: BLOOD  Result Value Ref Range Status   Specimen Description BLOOD SITE NOT SPECIFIED  Final   Special Requests   Final    BOTTLES DRAWN AEROBIC AND ANAEROBIC Blood Culture results may not be optimal due to an excessive volume of blood received in culture bottles   Culture   Final    NO GROWTH 5 DAYS Performed at [facility], [place] [address] [place], [place] [address]    Report Status 11/17/2019 FINAL  Final  Culture, blood (routine x 2)     Status: Abnormal   Collection Time: 11/12/19 10:06 PM   Specimen: BLOOD  Result Value Ref Range Status   Specimen Description BLOOD SITE NOT SPECIFIED  Final   Special Requests   Final    BOTTLES DRAWN  AEROBIC AND ANAEROBIC Blood Culture results may not be optimal due to an excessive volume of blood received in culture bottles   Culture  Setup Time   Final    GRAM POSITIVE COCCI ANAEROBIC BOTTLE ONLY CRITICAL RESULT CALLED TO, READ BACK BY AND VERIFIED WITH: RN J ALGHALI @0058  10/14/19 BY S GEZAHEGN  Performed at [facility], [place] [address], [place], [place] [address]    Culture STAPHYLOCOCCUS EPIDERMIDIS (A)  Final   Report Status 11/16/2019 FINAL  Final   Organism ID, Bacteria STAPHYLOCOCCUS EPIDERMIDIS  Final      Susceptibility   Staphylococcus epidermidis - MIC*    CIPROFLOXACIN >=8 RESISTANT Resistant     ERYTHROMYCIN >=8 RESISTANT Resistant     GENTAMICIN 8 INTERMEDIATE Intermediate     OXACILLIN >=4 RESISTANT Resistant     TETRACYCLINE 2 SENSITIVE Sensitive     VANCOMYCIN 1 SENSITIVE Sensitive     TRIMETH/SULFA 80 RESISTANT Resistant     CLINDAMYCIN >=8 RESISTANT Resistant     RIFAMPIN <=0.5 SENSITIVE Sensitive     Inducible Clindamycin NEGATIVE Sensitive     * STAPHYLOCOCCUS EPIDERMIDIS  Blood Culture ID Panel (Reflexed)     Status: Abnormal   Collection Time: 11/12/19 10:06 PM  Result Value Ref Range Status   Enterococcus faecalis NOT DETECTED NOT DETECTED Final   Enterococcus Faecium NOT DETECTED NOT DETECTED Final   Listeria monocytogenes NOT DETECTED NOT DETECTED Final   Staphylococcus species DETECTED (A) NOT DETECTED Final    Comment: CRITICAL RESULT CALLED TO, READ BACK BY AND VERIFIED WITH: RN J ALGHALI @0058  10/14/19 BY S GEZAHEGN     Staphylococcus aureus (BCID) NOT DETECTED NOT DETECTED Final   Staphylococcus epidermidis DETECTED (A) NOT DETECTED Final    Comment: Methicillin (oxacillin) resistant coagulase negative staphylococcus. Possible blood culture contaminant (unless isolated from more than one blood culture draw or clinical case suggests pathogenicity). No antibiotic treatment is indicated for blood  culture contaminants. CRITICAL RESULT CALLED  TO, READ BACK BY AND VERIFIED WITH: RN J ALGHALI @0058  10/14/19 BY S GEZAHEGN     Staphylococcus lugdunensis NOT DETECTED NOT DETECTED Final   Streptococcus species NOT DETECTED NOT DETECTED Final   Streptococcus  agalactiae NOT DETECTED NOT DETECTED Final   Streptococcus pneumoniae NOT DETECTED NOT DETECTED Final   Streptococcus pyogenes NOT DETECTED NOT DETECTED Final   A.calcoaceticus-baumannii NOT DETECTED NOT DETECTED Final   Bacteroides fragilis NOT DETECTED NOT DETECTED Final   Enterobacterales NOT DETECTED NOT DETECTED Final   Enterobacter cloacae complex NOT DETECTED NOT DETECTED Final   Escherichia coli NOT DETECTED NOT DETECTED Final   Klebsiella aerogenes NOT DETECTED NOT DETECTED Final   Klebsiella oxytoca NOT DETECTED NOT DETECTED Final   Klebsiella pneumoniae NOT DETECTED NOT DETECTED Final   Proteus species NOT DETECTED NOT DETECTED Final   Salmonella species NOT DETECTED NOT DETECTED Final   Serratia marcescens NOT DETECTED NOT DETECTED Final   Haemophilus influenzae NOT DETECTED NOT DETECTED Final   Neisseria meningitidis NOT DETECTED NOT DETECTED Final   Pseudomonas aeruginosa NOT DETECTED NOT DETECTED Final   Stenotrophomonas maltophilia NOT DETECTED NOT DETECTED Final   Candida albicans NOT DETECTED NOT DETECTED Final   Candida auris NOT DETECTED NOT DETECTED Final   Candida glabrata NOT DETECTED NOT DETECTED Final   Candida krusei NOT DETECTED NOT DETECTED Final   Candida parapsilosis NOT DETECTED NOT DETECTED Final   Candida tropicalis NOT DETECTED NOT DETECTED Final   Cryptococcus neoformans/gattii NOT DETECTED NOT DETECTED Final   Methicillin resistance mecA/C DETECTED (A) NOT DETECTED Final    Comment: CRITICAL RESULT CALLED TO, READ BACK BY AND VERIFIED WITH: RN J ALGHALI @0058  10/14/19 BY S GEZAHEGN  Performed at [facility], [address] [place], [place] [address]   Culture, respiratory (non-expectorated)     Status: None   Collection Time:  11/12/19 10:23 PM   Specimen: Tracheal Aspirate; Respiratory  Result Value Ref Range Status   Specimen Description TRACHEAL ASPIRATE  Final   Special Requests NONE  Final   Gram Stain   Final    RARE WBC PRESENT,BOTH PMN AND MONONUCLEAR NO ORGANISMS SEEN Performed at [facility], [address], [place], [place] [address]    Culture FEW CANDIDA ALBICANS  Final   Report Status 11/15/2019 FINAL  Final  Culture, blood (routine x 2)     Status: None   Collection Time: 11/15/19  3:24 PM   Specimen: BLOOD RIGHT HAND  Result Value Ref Range Status   Specimen Description BLOOD RIGHT HAND  Final   Special Requests   Final    BOTTLES DRAWN AEROBIC AND ANAEROBIC Blood Culture adequate volume   Culture   Final    NO GROWTH 5 DAYS Performed at [facility], [place] [address], [place], [place] [address]    Report Status 11/20/2019 FINAL  Final  Culture, blood (routine x 2)     Status: None   Collection Time: 11/15/19  3:27 PM   Specimen: BLOOD LEFT HAND  Result Value Ref Range Status   Specimen Description BLOOD LEFT HAND  Final   Special Requests   Final    BOTTLES DRAWN AEROBIC AND ANAEROBIC Blood Culture adequate volume   Culture   Final    NO GROWTH 5 DAYS Performed at [facility], [place] [address] [place], [place] [address]    Report Status 11/20/2019 FINAL  Final  Culture, Urine     Status: None   Collection Time: 11/20/19  4:05 PM   Specimen: Urine, Random  Result Value Ref Range Status   Specimen Description URINE, RANDOM  Final   Special Requests NONE  Final   Culture  Final    NO GROWTH Performed at [facility], [place] [address] [place], [place] [address]    Report Status 11/21/2019 FINAL  Final  Culture, blood (routine x 2)     Status: None   Collection Time: 11/20/19  4:08 PM   Specimen: BLOOD  Result Value Ref Range Status   Specimen Description BLOOD SITE NOT SPECIFIED  Final   Special Requests   Final    BOTTLES DRAWN AEROBIC AND ANAEROBIC  Blood Culture adequate volume   Culture   Final    NO GROWTH 5 DAYS Performed at [facility], [place] [address], [place], [place] [address]    Report Status 11/25/2019 FINAL  Final  Culture, blood (routine x 2)     Status: None   Collection Time: 11/20/19  4:15 PM   Specimen: BLOOD  Result Value Ref Range Status   Specimen Description BLOOD SITE NOT SPECIFIED  Final   Special Requests   Final    BOTTLES DRAWN AEROBIC AND ANAEROBIC Blood Culture results may not be optimal due to an excessive volume of blood received in culture bottles   Culture   Final    NO GROWTH 5 DAYS Performed at [facility], [place] [address], [place], [place] [address]    Report Status 11/25/2019 FINAL  Final  Culture, respiratory (non-expectorated)     Status: None   Collection Time: 11/20/19  6:43 PM   Specimen: Tracheal Aspirate; Respiratory  Result Value Ref Range Status   Specimen Description TRACHEAL ASPIRATE  Final   Special Requests NONE  Final   Gram Stain   Final    FEW WBC PRESENT,BOTH PMN AND MONONUCLEAR NO ORGANISMS SEEN Performed at [facility], [address] [place], [place] [address]    Culture RARE CANDIDA ALBICANS  Final   Report Status 11/24/2019 FINAL  Final  C Difficile Quick Screen (NO PCR Reflex)     Status: None   Collection Time: 12/06/19  4:35 PM   Specimen: STOOL  Result Value Ref Range Status   C Diff antigen NEGATIVE NEGATIVE Final   C Diff toxin NEGATIVE NEGATIVE Final   C Diff interpretation No C. difficile detected.  Final    Comment: Performed at [facility], [place] [address] [place], [place] [address]    Coagulation Studies: No results for input(s): LABPROT, INR in the last 72 hours.  Urinalysis: No results for input(s): COLORURINE, LABSPEC, PHURINE, GLUCOSEU, HGBUR, BILIRUBINUR, KETONESUR, PROTEINUR, UROBILINOGEN, NITRITE, LEUKOCYTESUR in the last 72 hours.  Invalid input(s): APPERANCEUR    Imaging: No results found.   Medications:        Assessment/ Plan:  69 y.o. female with a PMHx of diabetes mellitus type 2, hyperlipidemia, plantar fasciitis, B12 deficiency, chronic low back pain, restless leg syndrome, GERD, nonalcoholic fatty liver disease, anxiety, nephrolithiasis, irritable bowel syndrome, hypertension, chronic kidney disease stage IIIa, who was admitted to [facility] on 11/05/2019 for ongoing treatment of post COVID-19 recovery.  1.  Acute kidney injury/chronic kidney disease stage IIIa.    Patient's renal function appears to have stabilized.  Creatinine currently 1.2 with a BUN of 58.  Continue to monitor renal parameters periodically.  2.  Acute respiratory failure secondary to COVID-19 infection.    Tracheostomy now capped.  Breathing comfortably.  3.  Anemia of chronic kidney disease.    Hemoglobin currently 9.1.  Patient to be started on Epogen 20,000 subcutaneous weekly.  4.  Secondary hyperparathyroidism.  Phosphorus up a bit to 6.0.  Continue periodically monitor.  5.  Bacteremia.  1 or 2 culture shows Staph epidermidis.  Dialysis catheter was removed.    LOS: 0 Angelique Chevalier 12/1/202111:06 AM

## 2019-12-15 DIAGNOSIS — U071 COVID-19: Secondary | ICD-10-CM | POA: Diagnosis not present

## 2019-12-15 DIAGNOSIS — Z9911 Dependence on respirator [ventilator] status: Secondary | ICD-10-CM | POA: Diagnosis not present

## 2019-12-15 DIAGNOSIS — J9621 Acute and chronic respiratory failure with hypoxia: Secondary | ICD-10-CM | POA: Diagnosis not present

## 2019-12-15 DIAGNOSIS — N186 End stage renal disease: Secondary | ICD-10-CM | POA: Diagnosis not present

## 2019-12-15 LAB — TRIGLYCERIDES: Triglycerides: 553 mg/dL — ABNORMAL HIGH (ref <150)

## 2019-12-15 NOTE — Progress Notes (Signed)
Pulmonary Critical Care Medicine [place]   PULMONARY CRITICAL CARE SERVICE  PROGRESS NOTE  Date of Service: 12/15/2019  Veronica Melton  RUE:[id number]  DOB: March 26, 1950   DOA: 11/05/2019  Referring Physician: Merton Border, MD  HPI: Veronica Melton is a 69 y.o. female seen for follow up of Acute on Chronic Respiratory Failure.  She is doing well she is actually been tolerating capping overnight  Medications: Reviewed on Rounds  Physical Exam:  Vitals: Temperature is 97.1 pulse 84 respiratory rate 20 blood pressure is 105/60 saturations 97%  Ventilator Settings capping on 28% FiO2  . General: Comfortable at this time . Eyes: Grossly normal lids, irises & conjunctiva . ENT: grossly tongue is normal . Neck: no obvious mass . Cardiovascular: S1 S2 normal no gallop . Respiratory: No rhonchi coarse breath sounds . Abdomen: soft . Skin: no rash seen on limited exam . Musculoskeletal: not rigid . Psychiatric:unable to assess . Neurologic: no seizure no involuntary movements         Lab Data:   Basic Metabolic Panel: Recent Labs  Lab 12/09/19 0430 12/10/19 0456 12/11/19 0302 12/12/19 0403 12/14/19 0425  NA 140 138 138 139 139  K 3.5 3.4* 3.9 4.2 3.5  CL 93* 92* 92* 93* 93*  CO2 33* 32 34* 31 30  GLUCOSE 151* 154* 144* 156* 145*  BUN 108* 95* 87* 63* 58*  CREATININE 1.59* 1.44* 1.33* 1.16* 1.27*  CALCIUM 11.0* 10.8* 11.1* 11.3* 11.2*  MG 1.7 1.9 1.7 2.3  --   PHOS 5.6* 5.5* 4.3 4.1 6.0*    ABG: Recent Labs  Lab 12/10/19 1210  PHART 7.435  PCO2ART 50.9*  PO2ART 60.2*  HCO3 33.7*  O2SAT 91.7    Liver Function Tests: Recent Labs  Lab 12/09/19 0430 12/10/19 0456 12/11/19 0302 12/12/19 0403 12/14/19 0425  ALBUMIN 3.0* 3.0* 3.1* 3.5 3.2*   No results for input(s): LIPASE, AMYLASE in the last 168 hours. No results for input(s): AMMONIA in the last 168 hours.  CBC: Recent Labs  Lab 12/10/19 0456 12/14/19 0425  WBC 7.8 9.9  HGB  9.0* 9.1*  HCT 30.0* 30.1*  MCV 94.9 94.7  PLT 230 271    Cardiac Enzymes: No results for input(s): CKTOTAL, CKMB, CKMBINDEX, TROPONINI in the last 168 hours.  BNP (last 3 results) No results for input(s): BNP in the last 8760 hours.  ProBNP (last 3 results) No results for input(s): PROBNP in the last 8760 hours.  Radiological Exams: DG Abd 1 View  Result Date: 12/14/2019 CLINICAL DATA:  69 year old female with nausea. EXAM: ABDOMEN - 1 VIEW COMPARISON:  Abdominal radiograph dated 11/05/2019. FINDINGS: There is a percutaneous gastrostomy with balloon in the body of the stomach. No bowel dilatation or evidence of obstruction. Air is noted within the colon. No free air or radiopaque calculi identified. Degenerative changes of the spine. Lower lumbar fusion hardware. No acute osseous pathology. IMPRESSION: Percutaneous gastrostomy with balloon in the body of the stomach. No evidence of bowel obstruction. Electronically Signed   By: Anner Crete M.D.   On: 12/14/2019 15:12    Assessment/Plan Active Problems:   Acute on chronic respiratory failure with hypoxia (HCC)   End stage renal disease on dialysis ([place])   COVID-19 virus infection   Pneumonia due to COVID-19 virus   1. Acute on chronic respiratory failure with hypoxia patient is on capping trials has been on 28% FiO2.  Good saturations are noted at this time. 2. End-stage renal failure resolved patient  is actually doing well with good volume urine output 3. COVID-19 virus infection resolution 4. Pneumonia due to COVID-19 treated slowly improving   I have personally seen and evaluated the patient, evaluated laboratory and imaging results, formulated the assessment and plan and placed orders. The Patient requires high complexity decision making with multiple systems involvement.  Rounds were done with the Respiratory Therapy Director and Staff therapists and discussed with nursing staff also.  Allyne Gee, MD  [facility] Pulmonary Critical Care Medicine Sleep Medicine

## 2019-12-16 ENCOUNTER — Other Ambulatory Visit (HOSPITAL_COMMUNITY): Payer: Medicare Other

## 2019-12-16 DIAGNOSIS — U071 COVID-19: Secondary | ICD-10-CM | POA: Diagnosis not present

## 2019-12-16 DIAGNOSIS — Z9911 Dependence on respirator [ventilator] status: Secondary | ICD-10-CM | POA: Diagnosis not present

## 2019-12-16 DIAGNOSIS — N186 End stage renal disease: Secondary | ICD-10-CM | POA: Diagnosis not present

## 2019-12-16 DIAGNOSIS — J9621 Acute and chronic respiratory failure with hypoxia: Secondary | ICD-10-CM | POA: Diagnosis not present

## 2019-12-16 LAB — RENAL FUNCTION PANEL
Albumin: 3.5 g/dL (ref 3.5–5.0)
Anion gap: 15 (ref 5–15)
BUN: 51 mg/dL — ABNORMAL HIGH (ref 8–23)
CO2: 33 mmol/L — ABNORMAL HIGH (ref 22–32)
Calcium: 12 mg/dL — ABNORMAL HIGH (ref 8.9–10.3)
Chloride: 93 mmol/L — ABNORMAL LOW (ref 98–111)
Creatinine, Ser: 1.11 mg/dL — ABNORMAL HIGH (ref 0.44–1.00)
GFR, Estimated: 53 mL/min — ABNORMAL LOW (ref >60)
Glucose, Bld: 151 mg/dL — ABNORMAL HIGH (ref 70–99)
Phosphorus: 4.6 mg/dL (ref 2.5–4.6)
Potassium: 3.7 mmol/L (ref 3.5–5.1)
Sodium: 141 mmol/L (ref 135–145)

## 2019-12-16 LAB — SARS CORONAVIRUS 2 BY RT PCR (HOSPITAL ORDER, PERFORMED IN ~~LOC~~ HOSPITAL LAB): SARS Coronavirus 2: NEGATIVE

## 2019-12-16 NOTE — Progress Notes (Signed)
Pulmonary Critical Care Medicine [place]   PULMONARY CRITICAL CARE SERVICE  PROGRESS NOTE  Date of Service: 12/16/2019  Veronica Melton  PXT:[id number]  DOB: 01/29/1950   DOA: 11/05/2019  Referring Physician: Merton Border, MD  HPI: Veronica Melton is a 69 y.o. female seen for follow up of Acute on Chronic Respiratory Failure. Patient is capping will be completing 48 hours currently is on 2 L of oxygen  Medications: Reviewed on Rounds  Physical Exam:  Vitals: Temperature is 98.2 pulse 87 respiratory rate 22 blood pressure is 111/68 saturations 97%  Ventilator Settings capping on 2 L  . General: Comfortable at this time . Eyes: Grossly normal lids, irises & conjunctiva . ENT: grossly tongue is normal . Neck: no obvious mass . Cardiovascular: S1 S2 normal no gallop . Respiratory: No rhonchi coarse breath sounds . Abdomen: soft . Skin: no rash seen on limited exam . Musculoskeletal: not rigid . Psychiatric:unable to assess . Neurologic: no seizure no involuntary movements         Lab Data:   Basic Metabolic Panel: Recent Labs  Lab 12/10/19 0456 12/11/19 0302 12/12/19 0403 12/14/19 0425 12/16/19 0539  NA 138 138 139 139 141  K 3.4* 3.9 4.2 3.5 3.7  CL 92* 92* 93* 93* 93*  CO2 32 34* 31 30 33*  GLUCOSE 154* 144* 156* 145* 151*  BUN 95* 87* 63* 58* 51*  CREATININE 1.44* 1.33* 1.16* 1.27* 1.11*  CALCIUM 10.8* 11.1* 11.3* 11.2* 12.0*  MG 1.9 1.7 2.3  --   --   PHOS 5.5* 4.3 4.1 6.0* 4.6    ABG: Recent Labs  Lab 12/10/19 1210  PHART 7.435  PCO2ART 50.9*  PO2ART 60.2*  HCO3 33.7*  O2SAT 91.7    Liver Function Tests: Recent Labs  Lab 12/10/19 0456 12/11/19 0302 12/12/19 0403 12/14/19 0425 12/16/19 0539  ALBUMIN 3.0* 3.1* 3.5 3.2* 3.5   No results for input(s): LIPASE, AMYLASE in the last 168 hours. No results for input(s): AMMONIA in the last 168 hours.  CBC: Recent Labs  Lab 12/10/19 0456 12/14/19 0425  WBC 7.8 9.9  HGB  9.0* 9.1*  HCT 30.0* 30.1*  MCV 94.9 94.7  PLT 230 271    Cardiac Enzymes: No results for input(s): CKTOTAL, CKMB, CKMBINDEX, TROPONINI in the last 168 hours.  BNP (last 3 results) No results for input(s): BNP in the last 8760 hours.  ProBNP (last 3 results) No results for input(s): PROBNP in the last 8760 hours.  Radiological Exams: DG Abd 1 View  Result Date: 12/14/2019 CLINICAL DATA:  69 year old female with nausea. EXAM: ABDOMEN - 1 VIEW COMPARISON:  Abdominal radiograph dated 11/05/2019. FINDINGS: There is a percutaneous gastrostomy with balloon in the body of the stomach. No bowel dilatation or evidence of obstruction. Air is noted within the colon. No free air or radiopaque calculi identified. Degenerative changes of the spine. Lower lumbar fusion hardware. No acute osseous pathology. IMPRESSION: Percutaneous gastrostomy with balloon in the body of the stomach. No evidence of bowel obstruction. Electronically Signed   By: Anner Crete M.D.   On: 12/14/2019 15:12    Assessment/Plan Active Problems:   Acute on chronic respiratory failure with hypoxia (HCC)   End stage renal disease on dialysis ([place])   COVID-19 virus infection   Pneumonia due to COVID-19 virus   1. Acute on chronic respiratory failure hypoxia plan is to continue with capping trials titrate oxygen continue pulmonary toilet 2. End-stage renal failure patient is being seen  by nephrology is resolved 3. Pneumonia due to COVID-19 treated improved 4. COVID-19 virus infection in recovery   I have personally seen and evaluated the patient, evaluated laboratory and imaging results, formulated the assessment and plan and placed orders. The Patient requires high complexity decision making with multiple systems involvement.  Rounds were done with the Respiratory Therapy Director and Staff therapists and discussed with nursing staff also.  Allyne Gee, MD [facility] Pulmonary Critical Care Medicine Sleep Medicine

## 2019-12-16 NOTE — Progress Notes (Signed)
[facility]  ROUNDING NOTE   Subjective:  Patient continues to be doing well. Tracheostomy remains capped. Renal function improving.   Objective:  Vital signs in last 24 hours:  Temperature 98.2 pulse 87 respirations 22 blood pressure 111/66  Physical Exam: General:  No acute distress  Head:  Normocephalic, atraumatic. Moist oral mucosal membranes  Eyes:  Anicteric  Neck:  Tracheostomy in place, capped  Lungs:   Scattered rhonchi, normal effort  Heart:  S1S2 no rubs  Abdomen:   Soft, nontender, bowel sounds present  Extremities:  No peripheral edema.  Neurologic:  Awake, alert  Skin:  No acute rashes  Access:  No dialysis access    Basic Metabolic Panel: Recent Labs  Lab 12/10/19 0456 12/10/19 0456 12/11/19 0302 12/11/19 0302 12/12/19 0403 12/14/19 0425 12/16/19 0539  NA 138  --  138  --  139 139 141  K 3.4*  --  3.9  --  4.2 3.5 3.7  CL 92*  --  92*  --  93* 93* 93*  CO2 32  --  34*  --  31 30 33*  GLUCOSE 154*  --  144*  --  156* 145* 151*  BUN 95*  --  87*  --  63* 58* 51*  CREATININE 1.44*  --  1.33*  --  1.16* 1.27* 1.11*  CALCIUM 10.8*   < > 11.1*   < > 11.3* 11.2* 12.0*  MG 1.9  --  1.7  --  2.3  --   --   PHOS 5.5*  --  4.3  --  4.1 6.0* 4.6   < > = values in this interval not displayed.    Liver Function Tests: Recent Labs  Lab 12/10/19 0456 12/11/19 0302 12/12/19 0403 12/14/19 0425 12/16/19 0539  ALBUMIN 3.0* 3.1* 3.5 3.2* 3.5   No results for input(s): LIPASE, AMYLASE in the last 168 hours. No results for input(s): AMMONIA in the last 168 hours.  CBC: Recent Labs  Lab 12/10/19 0456 12/14/19 0425  WBC 7.8 9.9  HGB 9.0* 9.1*  HCT 30.0* 30.1*  MCV 94.9 94.7  PLT 230 271    Cardiac Enzymes: No results for input(s): CKTOTAL, CKMB, CKMBINDEX, TROPONINI in the last 168 hours.  BNP: Invalid input(s): POCBNP  CBG: No results for input(s): GLUCAP in the last 168 hours.  Microbiology: Results for orders placed or  performed during the hospital encounter of 11/05/19  Culture, respiratory (non-expectorated)     Status: None   Collection Time: 11/06/19  1:46 PM   Specimen: Tracheal Aspirate; Respiratory  Result Value Ref Range Status   Specimen Description TRACHEAL ASPIRATE  Final   Special Requests NONE  Final   Gram Stain   Final    RARE WBC PRESENT, PREDOMINANTLY PMN NO ORGANISMS SEEN    Culture   Final    RARE Normal respiratory flora-no Staph aureus or Pseudomonas seen Performed at [facility] [address] [place], [place] [address]    Report Status 11/08/2019 FINAL  Final  Urine Culture     Status: Abnormal   Collection Time: 11/10/19  1:36 PM   Specimen: Urine, Random  Result Value Ref Range Status   Specimen Description URINE, RANDOM  Final   Special Requests   Final    NONE Performed at [facility], [place] [address], [place], [place] [address]    Culture 60,000 COLONIES/mL YEAST (A)  Final   Report Status 11/11/2019 FINAL  Final  Culture, blood (routine x 2)     Status: None   Collection Time: 11/12/19 10:00 PM   Specimen: BLOOD  Result Value Ref Range Status   Specimen Description BLOOD SITE NOT SPECIFIED  Final   Special Requests   Final    BOTTLES DRAWN AEROBIC AND ANAEROBIC Blood Culture results may not be optimal due to an excessive volume of blood received in culture bottles   Culture   Final    NO GROWTH 5 DAYS Performed at [facility], [place] [address], [place], [place] [address]    Report Status 11/17/2019 FINAL  Final  Culture, blood (routine x 2)     Status: Abnormal   Collection Time: 11/12/19 10:06 PM   Specimen: BLOOD  Result Value Ref Range Status   Specimen Description BLOOD SITE NOT SPECIFIED  Final   Special Requests   Final    BOTTLES DRAWN AEROBIC AND ANAEROBIC Blood Culture results may not be optimal due to an excessive volume of blood received in culture bottles   Culture  Setup Time   Final    GRAM POSITIVE COCCI ANAEROBIC BOTTLE  ONLY CRITICAL RESULT CALLED TO, READ BACK BY AND VERIFIED WITH: RN J ALGHALI @0058  10/14/19 BY S GEZAHEGN  Performed at [facility], [place] [address], [place], [place] [address]    Culture STAPHYLOCOCCUS EPIDERMIDIS (A)  Final   Report Status 11/16/2019 FINAL  Final   Organism ID, Bacteria STAPHYLOCOCCUS EPIDERMIDIS  Final      Susceptibility   Staphylococcus epidermidis - MIC*    CIPROFLOXACIN >=8 RESISTANT Resistant     ERYTHROMYCIN >=8 RESISTANT Resistant     GENTAMICIN 8 INTERMEDIATE Intermediate     OXACILLIN >=4 RESISTANT Resistant     TETRACYCLINE 2 SENSITIVE Sensitive     VANCOMYCIN 1 SENSITIVE Sensitive     TRIMETH/SULFA 80 RESISTANT Resistant     CLINDAMYCIN >=8 RESISTANT Resistant     RIFAMPIN <=0.5 SENSITIVE Sensitive     Inducible Clindamycin NEGATIVE Sensitive     * STAPHYLOCOCCUS EPIDERMIDIS  Blood Culture ID Panel (Reflexed)     Status: Abnormal   Collection Time: 11/12/19 10:06 PM  Result Value Ref Range Status   Enterococcus faecalis NOT DETECTED NOT DETECTED Final   Enterococcus Faecium NOT DETECTED NOT DETECTED Final   Listeria monocytogenes NOT DETECTED NOT DETECTED Final   Staphylococcus species DETECTED (A) NOT DETECTED Final    Comment: CRITICAL RESULT CALLED TO, READ BACK BY AND VERIFIED WITH: RN J ALGHALI @0058  10/14/19 BY S GEZAHEGN     Staphylococcus aureus (BCID) NOT DETECTED NOT DETECTED Final   Staphylococcus epidermidis DETECTED (A) NOT DETECTED Final    Comment: Methicillin (oxacillin) resistant coagulase negative staphylococcus. Possible blood culture contaminant (unless isolated from more than one blood culture draw or clinical case suggests pathogenicity). No antibiotic treatment is indicated for blood  culture contaminants. CRITICAL RESULT CALLED TO, READ BACK BY AND VERIFIED WITH: RN J ALGHALI @0058  10/14/19 BY S GEZAHEGN     Staphylococcus lugdunensis NOT DETECTED NOT DETECTED Final   Streptococcus species NOT DETECTED NOT DETECTED Final    Streptococcus agalactiae NOT DETECTED NOT DETECTED Final   Streptococcus pneumoniae NOT DETECTED NOT DETECTED Final   Streptococcus pyogenes NOT DETECTED NOT DETECTED Final   A.calcoaceticus-baumannii NOT DETECTED NOT DETECTED Final   Bacteroides fragilis NOT DETECTED NOT DETECTED Final   Enterobacterales NOT DETECTED NOT DETECTED Final   Enterobacter cloacae complex NOT DETECTED NOT DETECTED Final   Escherichia coli  NOT DETECTED NOT DETECTED Final   Klebsiella aerogenes NOT DETECTED NOT DETECTED Final   Klebsiella oxytoca NOT DETECTED NOT DETECTED Final   Klebsiella pneumoniae NOT DETECTED NOT DETECTED Final   Proteus species NOT DETECTED NOT DETECTED Final   Salmonella species NOT DETECTED NOT DETECTED Final   Serratia marcescens NOT DETECTED NOT DETECTED Final   Haemophilus influenzae NOT DETECTED NOT DETECTED Final   Neisseria meningitidis NOT DETECTED NOT DETECTED Final   Pseudomonas aeruginosa NOT DETECTED NOT DETECTED Final   Stenotrophomonas maltophilia NOT DETECTED NOT DETECTED Final   Candida albicans NOT DETECTED NOT DETECTED Final   Candida auris NOT DETECTED NOT DETECTED Final   Candida glabrata NOT DETECTED NOT DETECTED Final   Candida krusei NOT DETECTED NOT DETECTED Final   Candida parapsilosis NOT DETECTED NOT DETECTED Final   Candida tropicalis NOT DETECTED NOT DETECTED Final   Cryptococcus neoformans/gattii NOT DETECTED NOT DETECTED Final   Methicillin resistance mecA/C DETECTED (A) NOT DETECTED Final    Comment: CRITICAL RESULT CALLED TO, READ BACK BY AND VERIFIED WITH: RN J ALGHALI @0058  10/14/19 BY S GEZAHEGN  Performed at [facility], [address] [place], [place] [address]   Culture, respiratory (non-expectorated)     Status: None   Collection Time: 11/12/19 10:23 PM   Specimen: Tracheal Aspirate; Respiratory  Result Value Ref Range Status   Specimen Description TRACHEAL ASPIRATE  Final   Special Requests NONE  Final   Gram Stain   Final     RARE WBC PRESENT,BOTH PMN AND MONONUCLEAR NO ORGANISMS SEEN Performed at [facility], [address], [place], [place] [address]    Culture FEW CANDIDA ALBICANS  Final   Report Status 11/15/2019 FINAL  Final  Culture, blood (routine x 2)     Status: None   Collection Time: 11/15/19  3:24 PM   Specimen: BLOOD RIGHT HAND  Result Value Ref Range Status   Specimen Description BLOOD RIGHT HAND  Final   Special Requests   Final    BOTTLES DRAWN AEROBIC AND ANAEROBIC Blood Culture adequate volume   Culture   Final    NO GROWTH 5 DAYS Performed at [facility], [place] [address] [place], [place] [address]    Report Status 11/20/2019 FINAL  Final  Culture, blood (routine x 2)     Status: None   Collection Time: 11/15/19  3:27 PM   Specimen: BLOOD LEFT HAND  Result Value Ref Range Status   Specimen Description BLOOD LEFT HAND  Final   Special Requests   Final    BOTTLES DRAWN AEROBIC AND ANAEROBIC Blood Culture adequate volume   Culture   Final    NO GROWTH 5 DAYS Performed at [facility], [place] [address], [place], [place] [address]    Report Status 11/20/2019 FINAL  Final  Culture, Urine     Status: None   Collection Time: 11/20/19  4:05 PM   Specimen: Urine, Random  Result Value Ref Range Status   Specimen Description URINE, RANDOM  Final   Special Requests NONE  Final   Culture   Final    NO GROWTH Performed at [facility], [place] [address], [place], [place] [address]    Report Status 11/21/2019 FINAL  Final  Culture, blood (routine x 2)     Status: None   Collection Time: 11/20/19  4:08 PM   Specimen: BLOOD  Result Value Ref Range Status   Specimen Description BLOOD  SITE NOT SPECIFIED  Final   Special Requests   Final    BOTTLES DRAWN AEROBIC AND ANAEROBIC Blood Culture adequate volume   Culture   Final    NO GROWTH 5 DAYS Performed at [facility], [address] [place] [address] [place], [place] [address]    Report Status 11/25/2019 FINAL  Final  Culture,  blood (routine x 2)     Status: None   Collection Time: 11/20/19  4:15 PM   Specimen: BLOOD  Result Value Ref Range Status   Specimen Description BLOOD SITE NOT SPECIFIED  Final   Special Requests   Final    BOTTLES DRAWN AEROBIC AND ANAEROBIC Blood Culture results may not be optimal due to an excessive volume of blood received in culture bottles   Culture   Final    NO GROWTH 5 DAYS Performed at [facility], [place] [address], [place], [place] [address]    Report Status 11/25/2019 FINAL  Final  Culture, respiratory (non-expectorated)     Status: None   Collection Time: 11/20/19  6:43 PM   Specimen: Tracheal Aspirate; Respiratory  Result Value Ref Range Status   Specimen Description TRACHEAL ASPIRATE  Final   Special Requests NONE  Final   Gram Stain   Final    FEW WBC PRESENT,BOTH PMN AND MONONUCLEAR NO ORGANISMS SEEN Performed at [facility], [address], [place], [place] [address]    Culture RARE CANDIDA ALBICANS  Final   Report Status 11/24/2019 FINAL  Final  C Difficile Quick Screen (NO PCR Reflex)     Status: None   Collection Time: 12/06/19  4:35 PM   Specimen: STOOL  Result Value Ref Range Status   C Diff antigen NEGATIVE NEGATIVE Final   C Diff toxin NEGATIVE NEGATIVE Final   C Diff interpretation No C. difficile detected.  Final    Comment: Performed at [facility], [place] [address], [place], [place] [address]    Coagulation Studies: No results for input(s): LABPROT, INR in the last 72 hours.  Urinalysis: No results for input(s): COLORURINE, LABSPEC, PHURINE, GLUCOSEU, HGBUR, BILIRUBINUR, KETONESUR, PROTEINUR, UROBILINOGEN, NITRITE, LEUKOCYTESUR in the last 72 hours.  Invalid input(s): APPERANCEUR    Imaging: DG Abd 1 View  Result Date: 12/14/2019 CLINICAL DATA:  69 year old female with nausea. EXAM: ABDOMEN - 1 VIEW COMPARISON:  Abdominal radiograph dated 11/05/2019. FINDINGS: There is a percutaneous gastrostomy with balloon in the body of  the stomach. No bowel dilatation or evidence of obstruction. Air is noted within the colon. No free air or radiopaque calculi identified. Degenerative changes of the spine. Lower lumbar fusion hardware. No acute osseous pathology. IMPRESSION: Percutaneous gastrostomy with balloon in the body of the stomach. No evidence of bowel obstruction. Electronically Signed   By: Anner Crete M.D.   On: 12/14/2019 15:12     Medications:       Assessment/ Plan:  69 y.o. female with a PMHx of diabetes mellitus type 2, hyperlipidemia, plantar fasciitis, B12 deficiency, chronic low back pain, restless leg syndrome, GERD, nonalcoholic fatty liver disease, anxiety, nephrolithiasis, irritable bowel syndrome, hypertension, chronic kidney disease stage IIIa, who was admitted to [facility] on 11/05/2019 for ongoing treatment of post COVID-19 recovery.  1.  Acute kidney injury/chronic kidney disease stage IIIa.    BUN down to 51 with a creatinine down to 1.1.  Patient having good urine output.  Continue to monitor renal status.  2.  Acute respiratory failure secondary to COVID-19 infection.  Tracheostomy remains capped.  Hopefully she can be decannulated in the near future.  3.  Anemia of chronic kidney disease.    Epogen switch to 20,000 units subcutaneous weekly.  4.  Secondary hyperparathyroidism.  Phosphorus at target of 4.6.  Continue to periodically monitor.  5.  Bacteremia.  1 or 2 culture shows Staph epidermidis.  Dialysis catheter was removed.    LOS: 0 Lynzee Lindquist 12/3/20217:55 AM

## 2019-12-17 DIAGNOSIS — U071 COVID-19: Secondary | ICD-10-CM | POA: Diagnosis not present

## 2019-12-17 DIAGNOSIS — J9621 Acute and chronic respiratory failure with hypoxia: Secondary | ICD-10-CM | POA: Diagnosis not present

## 2019-12-17 DIAGNOSIS — Z9911 Dependence on respirator [ventilator] status: Secondary | ICD-10-CM | POA: Diagnosis not present

## 2019-12-17 DIAGNOSIS — N186 End stage renal disease: Secondary | ICD-10-CM | POA: Diagnosis not present

## 2019-12-17 NOTE — Progress Notes (Signed)
Pulmonary Critical Care Medicine [place]   PULMONARY CRITICAL CARE SERVICE  PROGRESS NOTE  Date of Service: 12/17/2019  Veronica Melton  BDZ:[id number]  DOB: 17-Aug-1950   DOA: 11/05/2019  Referring Physician: Merton Border, MD  HPI: Veronica Melton is a 69 y.o. female seen for follow up of Acute on Chronic Respiratory Failure.  Patient is capping has been capping for 72 hours  Medications: Reviewed on Rounds  Physical Exam:  Vitals: Temperature is 97.2 pulse 93 respiratory rate is 23 blood pressure is 142/73 saturations 100%  Ventilator Settings capping now completing 72 hours we will continue to monitor  . General: Comfortable at this time . Eyes: Grossly normal lids, irises & conjunctiva . ENT: grossly tongue is normal . Neck: no obvious mass . Cardiovascular: S1 S2 normal no gallop . Respiratory: Scattered rhonchi expansion is equal . Abdomen: soft . Skin: no rash seen on limited exam . Musculoskeletal: not rigid . Psychiatric:unable to assess . Neurologic: no seizure no involuntary movements         Lab Data:   Basic Metabolic Panel: Recent Labs  Lab 12/11/19 0302 12/12/19 0403 12/14/19 0425 12/16/19 0539  NA 138 139 139 141  K 3.9 4.2 3.5 3.7  CL 92* 93* 93* 93*  CO2 34* 31 30 33*  GLUCOSE 144* 156* 145* 151*  BUN 87* 63* 58* 51*  CREATININE 1.33* 1.16* 1.27* 1.11*  CALCIUM 11.1* 11.3* 11.2* 12.0*  MG 1.7 2.3  --   --   PHOS 4.3 4.1 6.0* 4.6    ABG: Recent Labs  Lab 12/10/19 1210  PHART 7.435  PCO2ART 50.9*  PO2ART 60.2*  HCO3 33.7*  O2SAT 91.7    Liver Function Tests: Recent Labs  Lab 12/11/19 0302 12/12/19 0403 12/14/19 0425 12/16/19 0539  ALBUMIN 3.1* 3.5 3.2* 3.5   No results for input(s): LIPASE, AMYLASE in the last 168 hours. No results for input(s): AMMONIA in the last 168 hours.  CBC: Recent Labs  Lab 12/14/19 0425  WBC 9.9  HGB 9.1*  HCT 30.1*  MCV 94.7  PLT 271    Cardiac Enzymes: No results  for input(s): CKTOTAL, CKMB, CKMBINDEX, TROPONINI in the last 168 hours.  BNP (last 3 results) No results for input(s): BNP in the last 8760 hours.  ProBNP (last 3 results) No results for input(s): PROBNP in the last 8760 hours.  Radiological Exams: No results found.  Assessment/Plan Active Problems:   Acute on chronic respiratory failure with hypoxia (HCC)   End stage renal disease on dialysis ([place])   COVID-19 virus infection   Pneumonia due to COVID-19 virus   1. Acute on chronic respiratory failure hypoxia even though patient has been capping for 72 hours I am going to hold off on decannulation and observe perhaps for another day or 2 2. End-stage renal failure on hemodialysis 3. COVID-19 virus infection with Covid 4. Pneumonia due to COVID-19 treated   I have personally seen and evaluated the patient, evaluated laboratory and imaging results, formulated the assessment and plan and placed orders. The Patient requires high complexity decision making with multiple systems involvement.  Rounds were done with the Respiratory Therapy Director and Staff therapists and discussed with nursing staff also.  Allyne Gee, MD [facility] Pulmonary Critical Care Medicine Sleep Medicine

## 2019-12-18 DIAGNOSIS — J9621 Acute and chronic respiratory failure with hypoxia: Secondary | ICD-10-CM | POA: Diagnosis not present

## 2019-12-18 DIAGNOSIS — N186 End stage renal disease: Secondary | ICD-10-CM | POA: Diagnosis not present

## 2019-12-18 DIAGNOSIS — Z9911 Dependence on respirator [ventilator] status: Secondary | ICD-10-CM | POA: Diagnosis not present

## 2019-12-18 DIAGNOSIS — U071 COVID-19: Secondary | ICD-10-CM | POA: Diagnosis not present

## 2019-12-18 LAB — NOVEL CORONAVIRUS, NAA (HOSP ORDER, SEND-OUT TO REF LAB; TAT 18-24 HRS): SARS-CoV-2, NAA: NOT DETECTED

## 2019-12-18 LAB — PTH, INTACT AND CALCIUM
Calcium, Total (PTH): 12 mg/dL — ABNORMAL HIGH (ref 8.7–10.3)
PTH: 6 pg/mL — ABNORMAL LOW (ref 15–65)

## 2019-12-18 LAB — TRIGLYCERIDES: Triglycerides: 520 mg/dL — ABNORMAL HIGH (ref <150)

## 2019-12-18 NOTE — Progress Notes (Signed)
Pulmonary Critical Care Medicine [place]   PULMONARY CRITICAL CARE SERVICE  PROGRESS NOTE  Date of Service: 12/18/2019  Veronica Melton  SKA:[id number]  DOB: [birth year]   DOA: 11/05/2019  Referring Physician: Merton Border, MD  HPI: Veronica Melton is a 69 y.o. female seen for follow up of Acute on Chronic Respiratory Failure.  Patient is capping doing well has been on 3 L of oxygen  Medications: Reviewed on Rounds  Physical Exam:  Vitals: Temperature is 98.2 pulse 92 respiratory 23 blood pressure is 149/77 saturations 98%  Ventilator Settings capping on 3 L of O2  . General: Comfortable at this time . Eyes: Grossly normal lids, irises & conjunctiva . ENT: grossly tongue is normal . Neck: no obvious mass . Cardiovascular: S1 S2 normal no gallop . Respiratory: No rhonchi very coarse .  Marland Kitchen Abdomen: soft . Skin: no rash seen on limited exam . Musculoskeletal: not rigid . Psychiatric:unable to assess . Neurologic: no seizure no involuntary movements         Lab Data:   Basic Metabolic Panel: Recent Labs  Lab 12/12/19 0403 12/14/19 0425 12/16/19 0539  NA 139 139 141  K 4.2 3.5 3.7  CL 93* 93* 93*  CO2 31 30 33*  GLUCOSE 156* 145* 151*  BUN 63* 58* 51*  CREATININE 1.16* 1.27* 1.11*  CALCIUM 11.3* 11.2* 12.0*  MG 2.3  --   --   PHOS 4.1 6.0* 4.6    ABG: No results for input(s): PHART, PCO2ART, PO2ART, HCO3, O2SAT in the last 168 hours.  Liver Function Tests: Recent Labs  Lab 12/12/19 0403 12/14/19 0425 12/16/19 0539  ALBUMIN 3.5 3.2* 3.5   No results for input(s): LIPASE, AMYLASE in the last 168 hours. No results for input(s): AMMONIA in the last 168 hours.  CBC: Recent Labs  Lab 12/14/19 0425  WBC 9.9  HGB 9.1*  HCT 30.1*  MCV 94.7  PLT 271    Cardiac Enzymes: No results for input(s): CKTOTAL, CKMB, CKMBINDEX, TROPONINI in the last 168 hours.  BNP (last 3 results) No results for input(s): BNP in the last 8760  hours.  ProBNP (last 3 results) No results for input(s): PROBNP in the last 8760 hours.  Radiological Exams: No results found.  Assessment/Plan Active Problems:   Acute on chronic respiratory failure with hypoxia (HCC)   End stage renal disease on dialysis ([place])   COVID-19 virus infection   Pneumonia due to COVID-19 virus   1. Acute on chronic respiratory failure hypoxia capping doing well plan is to proceed to decannulation 2. End-stage renal disease on hemodialysis we will continue to follow 3. COVID-19 virus infection treated 4. Pneumonia due to COVID-19 has been treated we will continue supportive care   I have personally seen and evaluated the patient, evaluated laboratory and imaging results, formulated the assessment and plan and placed orders. The Patient requires high complexity decision making with multiple systems involvement.  Rounds were done with the Respiratory Therapy Director and Staff therapists and discussed with nursing staff also.  Allyne Gee, MD [facility] Pulmonary Critical Care Medicine Sleep Medicine

## 2019-12-19 LAB — RENAL FUNCTION PANEL
Albumin: 3.6 g/dL (ref 3.5–5.0)
Anion gap: 15 (ref 5–15)
BUN: 55 mg/dL — ABNORMAL HIGH (ref 8–23)
CO2: 34 mmol/L — ABNORMAL HIGH (ref 22–32)
Calcium: 12.2 mg/dL — ABNORMAL HIGH (ref 8.9–10.3)
Chloride: 97 mmol/L — ABNORMAL LOW (ref 98–111)
Creatinine, Ser: 1.11 mg/dL — ABNORMAL HIGH (ref 0.44–1.00)
GFR, Estimated: 53 mL/min — ABNORMAL LOW (ref >60)
Glucose, Bld: 144 mg/dL — ABNORMAL HIGH (ref 70–99)
Phosphorus: 4.2 mg/dL (ref 2.5–4.6)
Potassium: 4 mmol/L (ref 3.5–5.1)
Sodium: 146 mmol/L — ABNORMAL HIGH (ref 135–145)

## 2019-12-19 LAB — CBC
HCT: 35.9 % — ABNORMAL LOW (ref 36.0–46.0)
Hemoglobin: 10.8 g/dL — ABNORMAL LOW (ref 12.0–15.0)
MCH: 29.3 pg (ref 26.0–34.0)
MCHC: 30.1 g/dL (ref 30.0–36.0)
MCV: 97.3 fL (ref 80.0–100.0)
Platelets: 315 10*3/uL (ref 150–400)
RBC: 3.69 MIL/uL — ABNORMAL LOW (ref 3.87–5.11)
RDW: 20.5 % — ABNORMAL HIGH (ref 11.5–15.5)
WBC: 12.2 10*3/uL — ABNORMAL HIGH (ref 4.0–10.5)
nRBC: 0 % (ref 0.0–0.2)

## 2019-12-19 LAB — MAGNESIUM: Magnesium: 2 mg/dL (ref 1.7–2.4)

## 2022-07-08 IMAGING — DX DG CHEST 1V PORT
1 series · 1 of 1 positions shown · non-contrast
Comparison: 11/07/2019

CLINICAL DATA: Ventilator dependence.

EXAM:
PORTABLE CHEST 1 VIEW

[chest ap]
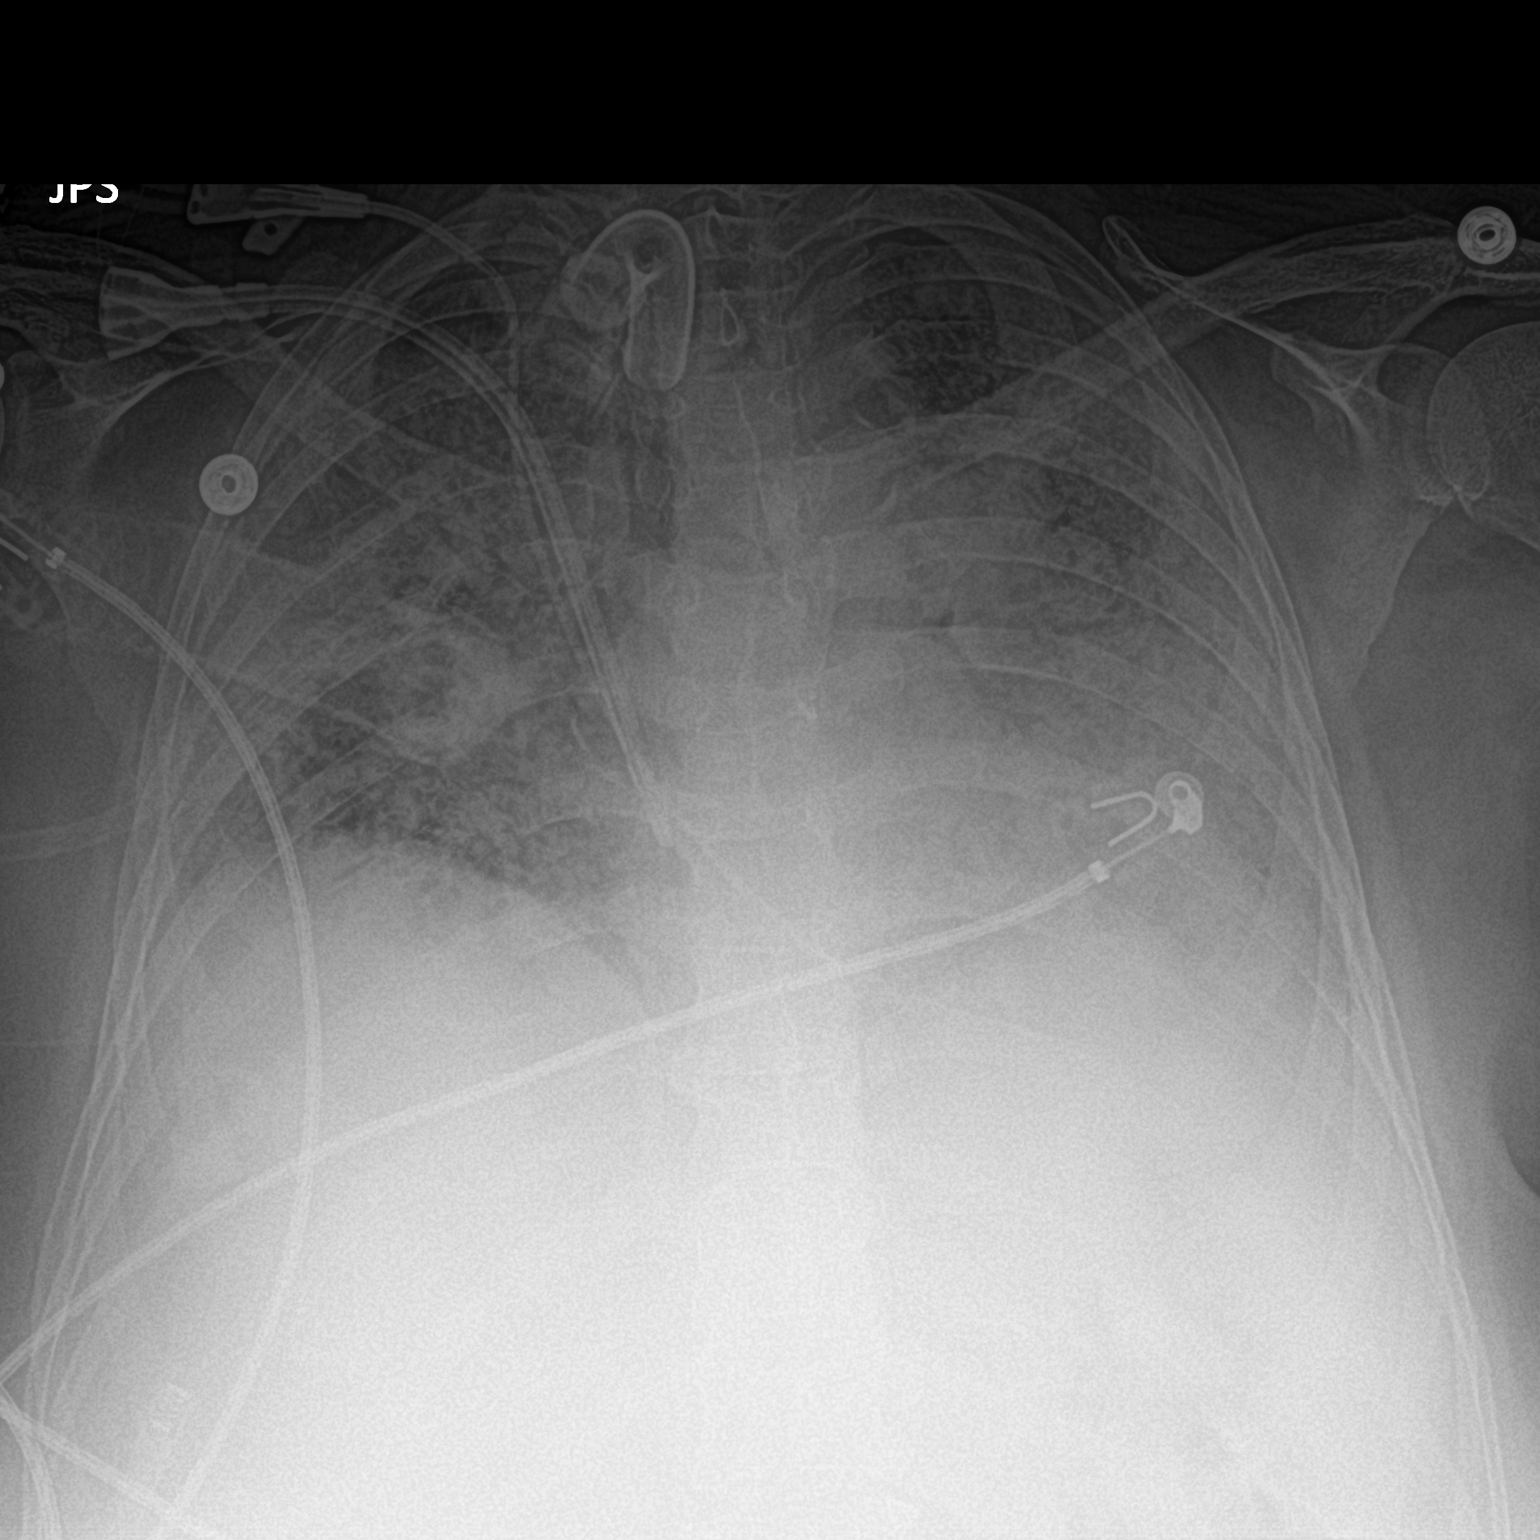

[1 of 1 positions shown; findings below may reference images not displayed]

FINDINGS: 9388 hours. Low lung volumes with persistent diffuse bilateral
airspace disease. Tracheostomy tube again noted. 2 right-sided
central lines again noted with tip position over the SVC/RA junction
and right atrium near the tricuspid valve, respectively. Telemetry
leads overlie the chest.
IMPRESSION: Stable exam. Diffuse bilateral airspace disease.

The smaller lumen right-sided central line tip projects in the
region of the tricuspid valve.

## 2022-07-14 IMAGING — DX DG CHEST 1V PORT
1 series · 1 of 1 positions shown · non-contrast
Comparison: 11/13/2019 .

CLINICAL DATA: Evaluate tracheostomy tube placement. Respiratory
failure

EXAM:
PORTABLE CHEST 1 VIEW

[chest]
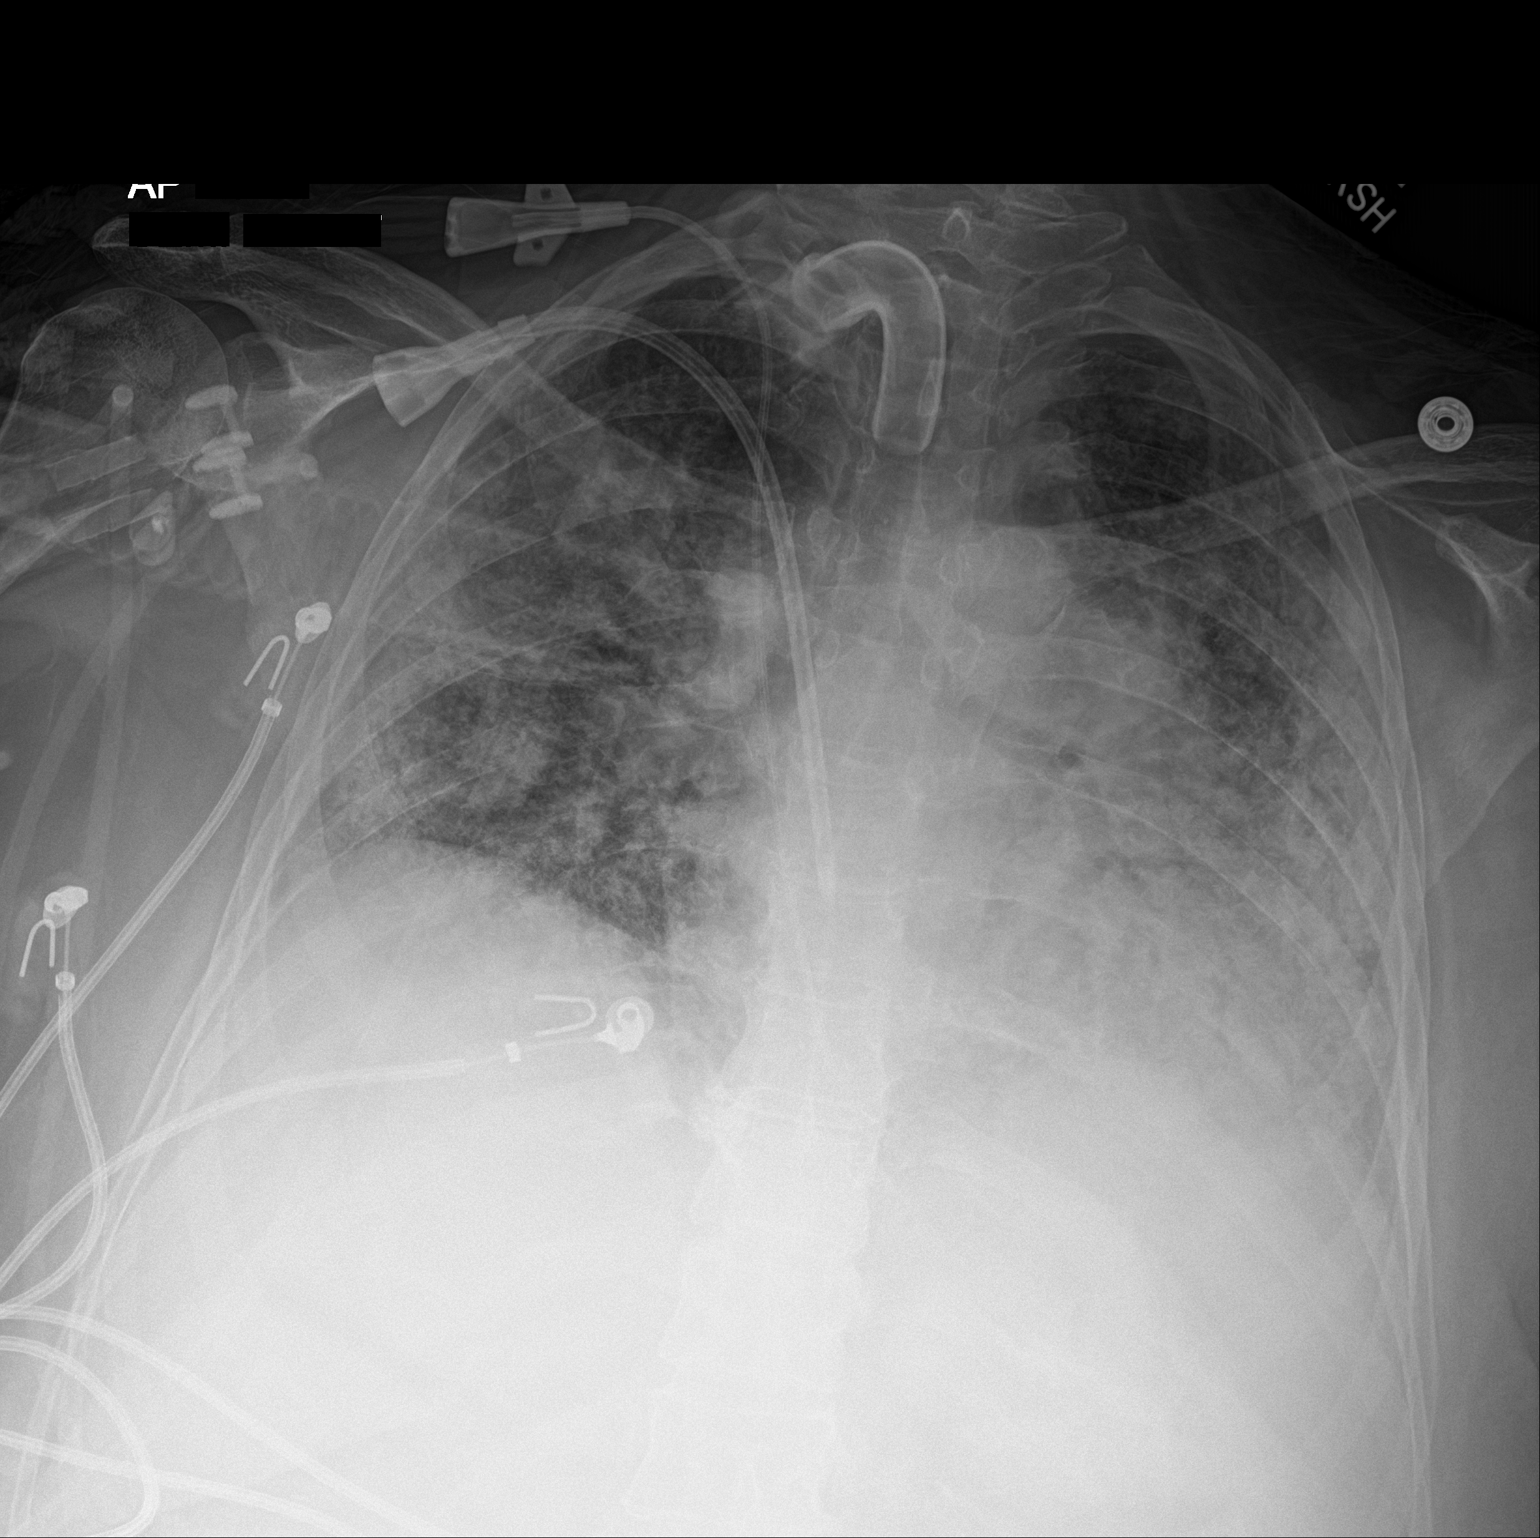

[1 of 1 positions shown; findings below may reference images not displayed]

FINDINGS: Tracheostomy tube tip is approximately 3.9 cm above the carina. Not
significantly changed from previous exam. Small bore right IJ
catheter tip is in the right atrium. Dual lumen catheter is
identified with tip at the cavoatrial junction. Stable
cardiomediastinal contours. No change in diffuse bilateral
interstitial and airspace densities.
IMPRESSION: No change in aeration to the lungs compared with previous exam.

## 2022-07-20 IMAGING — DX DG CHEST 1V PORT
1 series · 1 of 1 positions shown · non-contrast
Comparison: 11/17/2019

CLINICAL DATA: Pneumonia and ARDS.

EXAM:
PORTABLE CHEST 1 VIEW

[chest ap]
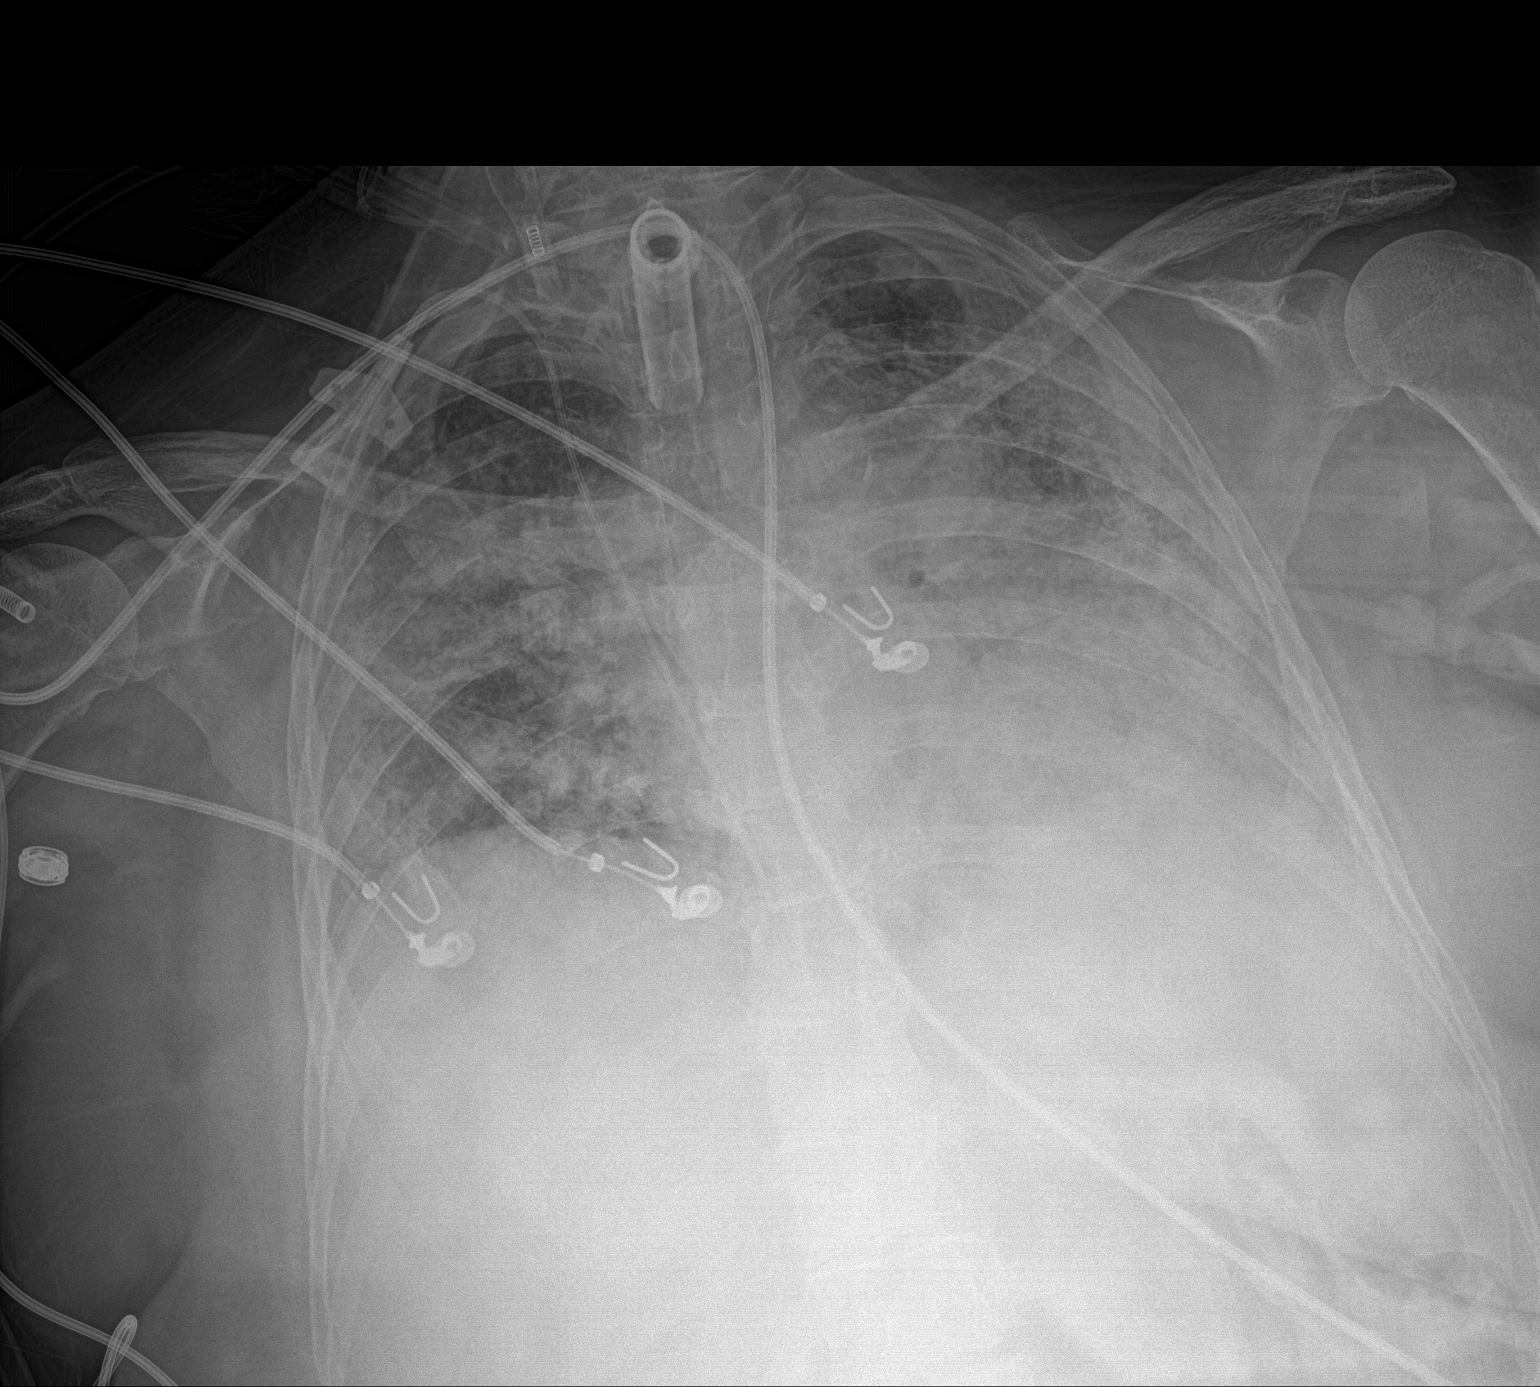

[1 of 1 positions shown; findings below may reference images not displayed]

FINDINGS: Tracheostomy tube tip is above the carina. There is a right IJ
catheter with tip in the inferior cavoatrial junction. Diffuse
bilateral interstitial and airspace densities. When compared with
the previous exam there is worsening opacification of the left lower
lobe. No pneumothorax identified
IMPRESSION: Worsening opacification of the left lower lobe with persistent
diffuse bilateral interstitial and airspace densities.
